# Patient Record
Sex: Female | Born: 1935
Health system: Southern US, Community
[De-identification: ages and names within clinical notes are randomized; demographics above are authoritative.]

## PROBLEM LIST (undated history)

## (undated) DIAGNOSIS — Z950 Presence of cardiac pacemaker: Secondary | ICD-10-CM

## (undated) DIAGNOSIS — I495 Sick sinus syndrome: Secondary | ICD-10-CM

## (undated) DIAGNOSIS — R011 Cardiac murmur, unspecified: Secondary | ICD-10-CM

## (undated) DIAGNOSIS — E039 Hypothyroidism, unspecified: Secondary | ICD-10-CM

## (undated) DIAGNOSIS — I10 Essential (primary) hypertension: Secondary | ICD-10-CM

## (undated) DIAGNOSIS — I499 Cardiac arrhythmia, unspecified: Secondary | ICD-10-CM

## (undated) DIAGNOSIS — M1990 Unspecified osteoarthritis, unspecified site: Secondary | ICD-10-CM

## (undated) HISTORY — PX: INSERT / REPLACE / REMOVE PACEMAKER: SUR710

## (undated) HISTORY — PX: ABDOMINAL HYSTERECTOMY: SHX81

## (undated) HISTORY — DX: Sick sinus syndrome: I49.5

## (undated) HISTORY — PX: OTHER SURGICAL HISTORY: SHX169

## (undated) HISTORY — PX: ROTATOR CUFF REPAIR: SHX139

---

## 1997-12-02 ENCOUNTER — Ambulatory Visit (HOSPITAL_COMMUNITY): Admission: RE | Admit: 1997-12-02 | Discharge: 1997-12-02 | Payer: Self-pay | Admitting: Family Medicine

## 1998-04-28 ENCOUNTER — Ambulatory Visit (HOSPITAL_COMMUNITY): Admission: RE | Admit: 1998-04-28 | Discharge: 1998-04-28 | Payer: Self-pay | Admitting: Gastroenterology

## 2000-12-15 ENCOUNTER — Encounter: Payer: Self-pay | Admitting: Orthopedic Surgery

## 2000-12-22 ENCOUNTER — Observation Stay (HOSPITAL_COMMUNITY): Admission: RE | Admit: 2000-12-22 | Discharge: 2000-12-23 | Payer: Self-pay | Admitting: Orthopedic Surgery

## 2001-06-13 ENCOUNTER — Encounter: Payer: Self-pay | Admitting: Gastroenterology

## 2003-04-21 ENCOUNTER — Encounter: Admission: RE | Admit: 2003-04-21 | Discharge: 2003-05-28 | Payer: Self-pay | Admitting: Family Medicine

## 2003-05-16 ENCOUNTER — Encounter: Admission: RE | Admit: 2003-05-16 | Discharge: 2003-08-14 | Payer: Self-pay | Admitting: Family Medicine

## 2004-02-03 ENCOUNTER — Ambulatory Visit (HOSPITAL_COMMUNITY): Admission: RE | Admit: 2004-02-03 | Discharge: 2004-02-03 | Payer: Self-pay | Admitting: Orthopedic Surgery

## 2004-02-03 ENCOUNTER — Ambulatory Visit (HOSPITAL_BASED_OUTPATIENT_CLINIC_OR_DEPARTMENT_OTHER): Admission: RE | Admit: 2004-02-03 | Discharge: 2004-02-03 | Payer: Self-pay | Admitting: Orthopedic Surgery

## 2006-05-31 ENCOUNTER — Ambulatory Visit: Payer: Self-pay | Admitting: Gastroenterology

## 2006-06-06 ENCOUNTER — Ambulatory Visit: Payer: Self-pay | Admitting: Gastroenterology

## 2006-06-06 ENCOUNTER — Encounter (INDEPENDENT_AMBULATORY_CARE_PROVIDER_SITE_OTHER): Payer: Self-pay | Admitting: Specialist

## 2008-12-23 ENCOUNTER — Telehealth: Payer: Self-pay | Admitting: Gastroenterology

## 2009-09-18 ENCOUNTER — Encounter (INDEPENDENT_AMBULATORY_CARE_PROVIDER_SITE_OTHER): Payer: Self-pay | Admitting: *Deleted

## 2009-09-18 ENCOUNTER — Encounter: Payer: Self-pay | Admitting: Cardiology

## 2009-09-18 LAB — CONVERTED CEMR LAB
ALT: 15 units/L
AST: 15 units/L
Albumin: 4.6 g/dL
Alkaline Phosphatase: 108 units/L
BUN: 15 mg/dL
CO2: 23 meq/L
Calcium: 9.7 mg/dL
Chloride: 23 meq/L
Cholesterol: 168 mg/dL
Creatinine, Ser: 0.94 mg/dL
Glucose, Bld: 101 mg/dL
HCT: 33.8 %
HDL: 62 mg/dL
Hemoglobin: 10.6 g/dL
LDL Cholesterol: 73 mg/dL
MCV: 63.4 fL
Platelets: 482 10*3/uL
Potassium: 4.7 meq/L
Sodium: 140 meq/L
TSH: 1.636 microintl units/mL
Total Protein: 6.8 g/dL
Triglycerides: 163 mg/dL
WBC: 9.9 10*3/uL

## 2010-03-10 ENCOUNTER — Ambulatory Visit (HOSPITAL_COMMUNITY)
Admission: RE | Admit: 2010-03-10 | Discharge: 2010-03-11 | Payer: Self-pay | Source: Home / Self Care | Admitting: Orthopedic Surgery

## 2010-07-20 ENCOUNTER — Encounter (INDEPENDENT_AMBULATORY_CARE_PROVIDER_SITE_OTHER): Payer: Self-pay | Admitting: *Deleted

## 2010-08-26 NOTE — Miscellaneous (Signed)
Summary: cbc,cmp,lipids,tsh  Clinical Lists Changes  Observations: Added new observation of CALCIUM: 9.7 mg/dL (62/95/2841 32:44) Added new observation of ALBUMIN: 4.6 g/dL (07/27/7251 66:44) Added new observation of PROTEIN, TOT: 6.8 g/dL (03/47/4259 56:38) Added new observation of SGPT (ALT): 15 units/L (09/18/2009 13:45) Added new observation of SGOT (AST): 15 units/L (09/18/2009 13:45) Added new observation of ALK PHOS: 108 units/L (09/18/2009 13:45) Added new observation of BILI DIRECT: total bili   0.6 mg/dL (75/64/3329 51:88) Added new observation of CREATININE: 0.94 mg/dL (41/66/0630 16:01) Added new observation of BUN: 15 mg/dL (09/32/3557 32:20) Added new observation of BG RANDOM: 101 mg/dL (25/42/7062 37:62) Added new observation of CO2 PLSM/SER: 23 meq/L (09/18/2009 13:45) Added new observation of CL SERUM: 23 meq/L (09/18/2009 13:45) Added new observation of K SERUM: 4.7 meq/L (09/18/2009 13:45) Added new observation of NA: 140 meq/L (09/18/2009 13:45) Added new observation of LDL: 73 mg/dL (83/15/1761 60:73) Added new observation of HDL: 62 mg/dL (71/12/2692 85:46) Added new observation of TRIGLYC TOT: 163 mg/dL (27/09/5007 38:18) Added new observation of CHOLESTEROL: 168 mg/dL (29/93/7169 67:89) Added new observation of PLATELETK/UL: 482 K/uL (09/18/2009 13:45) Added new observation of MCV: 63.4 fL (09/18/2009 13:45) Added new observation of HCT: 33.8 % (09/18/2009 13:45) Added new observation of HGB: 10.6 g/dL (38/04/1750 02:58) Added new observation of WBC COUNT: 9.9 10*3/microliter (09/18/2009 13:45) Added new observation of TSH: 1.636 microintl units/mL (09/18/2009 13:45)

## 2010-08-26 NOTE — Cardiovascular Report (Signed)
Summary: Cardiac Cath Other  Cardiac Cath Other   Imported By: Faythe Ghee 09/18/2009 10:34:37  _____________________________________________________________________  External Attachment:    Type:   Image     Comment:   External Document

## 2010-08-26 NOTE — Letter (Signed)
Summary: New Patient letter  [facility]  [address] [place], [place] [address]   Phone: [phone]  Fax: [phone]       07/20/2010 MRN: [id number]  Katelyn Morris [address] [place], [place]  [address]  Dear Katelyn Morris,  Welcome to the Gastroenterology Division at [facility].    You are scheduled to see Dr.  Arlyce Dice on 08-30-10 at 10:45a.m. on the 3rd floor at [facility], [address]  We ask that you try to arrive at our office 15 minutes prior to your appointment time to allow for check-in.  We would like you to complete the enclosed self-administered evaluation form prior to your visit and bring it with you on the day of your appointment.  We will review it with you.  Also, please bring a complete list of all your medications or, if you prefer, bring the medication bottles and we will list them.  Please bring your insurance card so that we may make a copy of it.  If your insurance requires a referral to see a specialist, please bring your referral form from your primary care physician.  Co-payments are due at the time of your visit and may be paid by cash, check or credit card.     Your office visit will consist of a consult with your physician (includes a physical exam), any laboratory testing he/she may order, scheduling of any necessary diagnostic testing (e.g. x-ray, ultrasound, CT-scan), and scheduling of a procedure (e.g. Endoscopy, Colonoscopy) if required.  Please allow enough time on your schedule to allow for any/all of these possibilities.    If you cannot keep your appointment, please call [phone] to cancel or reschedule prior to your appointment date.  This allows [place] the opportunity to schedule an appointment for another patient in need of care.  If you do not cancel or reschedule by 5 p.m. the business day prior to your appointment date, you will be charged a $50.00 late cancellation/no-show fee.    Thank you for choosing [facility] for your medical needs.  We appreciate the opportunity to care for you.  Please visit [place] at our website  to learn more about our practice.                     Sincerely,                                                             The Gastroenterology Division

## 2010-08-26 NOTE — Letter (Signed)
Summary: [facility] DISCHARGE   Imported By: Faythe Ghee 09/18/2009 10:34:07  _____________________________________________________________________  External Attachment:    Type:   Image     Comment:   External Document

## 2010-08-30 ENCOUNTER — Ambulatory Visit (INDEPENDENT_AMBULATORY_CARE_PROVIDER_SITE_OTHER): Payer: MEDICARE | Admitting: Gastroenterology

## 2010-08-30 ENCOUNTER — Encounter: Payer: Self-pay | Admitting: Gastroenterology

## 2010-08-30 DIAGNOSIS — Z860100 Personal history of colon polyps, unspecified: Secondary | ICD-10-CM

## 2010-08-30 DIAGNOSIS — Z800 Family history of malignant neoplasm of digestive organs: Secondary | ICD-10-CM

## 2010-08-30 DIAGNOSIS — R141 Gas pain: Secondary | ICD-10-CM

## 2010-08-30 DIAGNOSIS — K5730 Diverticulosis of large intestine without perforation or abscess without bleeding: Secondary | ICD-10-CM | POA: Insufficient documentation

## 2010-08-30 DIAGNOSIS — R142 Eructation: Secondary | ICD-10-CM

## 2010-08-30 DIAGNOSIS — E039 Hypothyroidism, unspecified: Secondary | ICD-10-CM | POA: Insufficient documentation

## 2010-08-30 DIAGNOSIS — E079 Disorder of thyroid, unspecified: Secondary | ICD-10-CM | POA: Insufficient documentation

## 2010-08-30 DIAGNOSIS — R143 Flatulence: Secondary | ICD-10-CM | POA: Insufficient documentation

## 2010-08-30 DIAGNOSIS — I10 Essential (primary) hypertension: Secondary | ICD-10-CM | POA: Insufficient documentation

## 2010-08-31 ENCOUNTER — Other Ambulatory Visit: Payer: Self-pay | Admitting: Gastroenterology

## 2010-08-31 ENCOUNTER — Other Ambulatory Visit (AMBULATORY_SURGERY_CENTER): Payer: MEDICARE | Admitting: Gastroenterology

## 2010-08-31 DIAGNOSIS — Z800 Family history of malignant neoplasm of digestive organs: Secondary | ICD-10-CM

## 2010-08-31 DIAGNOSIS — K5730 Diverticulosis of large intestine without perforation or abscess without bleeding: Secondary | ICD-10-CM

## 2010-08-31 DIAGNOSIS — Z86010 Personal history of colonic polyps: Secondary | ICD-10-CM

## 2010-08-31 DIAGNOSIS — Z1211 Encounter for screening for malignant neoplasm of colon: Secondary | ICD-10-CM

## 2010-08-31 DIAGNOSIS — D126 Benign neoplasm of colon, unspecified: Secondary | ICD-10-CM

## 2010-09-07 ENCOUNTER — Encounter: Payer: Self-pay | Admitting: Gastroenterology

## 2010-09-09 NOTE — Assessment & Plan Note (Signed)
Summary: consult colon    History of Present Illness Visit Type: new patient  Primary GI MD: Melvia Heaps MD [facility] Primary Provider: C. Duane Lope, MD [facility] Physician) Requesting Provider: na Chief Complaint: Consult colon. Pt c/o bloating  History of Present Illness:   [name] is a pleasant 77 year old white female with a history of colon polyps here for to set up a colonoscopy.  Family history is pertinent for 2 sisters with colon cancer.  In 1999 24 polyps were removed.  Subsequent colonoscopies in 2002 and in 2007 small polyps were removed.  She has no GI complaints including change of bowel habits, abdominal pain, melena or hematochezia.   GI Review of Systems    Reports bloating.      Denies abdominal pain, acid reflux, belching, chest pain, dysphagia with liquids, dysphagia with solids, heartburn, loss of appetite, nausea, vomiting, vomiting blood, weight loss, and  weight gain.        Denies anal fissure, black tarry stools, change in bowel habit, constipation, diarrhea, diverticulosis, fecal incontinence, heme positive stool, hemorrhoids, irritable bowel syndrome, jaundice, light color stool, liver problems, rectal bleeding, and  rectal pain.    Current Medications (verified): 1)  Levoxyl 50 Mcg Tabs (Levothyroxine Sodium) .... One Tablet By Mouth Once Daily 2)  Premarin 0.625 Mg Tabs (Estrogens Conjugated) .... 1/2 Tablet By Mouth At Bedtime As Needed 3)  Hyzaar 50-12.5 Mg Tabs (Losartan Potassium-Hctz) .... One Tablet By Mouth Once Daily 4)  Aspirin 81 Mg  Tabs (Aspirin) .... One Tablet By Mouth Once Daily  Allergies (verified): 1)  ! 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  Alcohol Use - no Daily Caffeine Use: one daily  Illicit Drug Use - no Smoking Status:  never Drug Use:  no  Review of Systems       The patient complains of hearing problems.  The patient denies allergy/sinus, anemia, anxiety-new, arthritis/joint pain, back pain, blood in urine, breast changes/lumps, change in vision, confusion, cough, coughing up blood, depression-new, fainting, fatigue, fever, headaches-new, heart murmur, heart rhythm changes, itching, menstrual pain, muscle pains/cramps, night sweats, nosebleeds, pregnancy symptoms, shortness of breath, skin rash, sleeping problems, sore throat, swelling of feet/legs, swollen lymph glands, thirst - excessive , urination - excessive , urination changes/pain, urine leakage, vision changes, and voice change.         All other systems were reviewed and were negative   Vital Signs:  Patient profile:   77 year old female Height:      63 inches Weight:      135 pounds BMI:     24.00 BSA:     1.64 Pulse rate:   64 / minute Pulse rhythm:   regular BP sitting:   132 / 80  (left arm) Cuff size:   regular  Vitals Entered By: Ok Anis CMA (August 30, 2010 10:41 AM)  Physical Exam  Additional Exam:  On physical exam she is a well-developed well-nourished female  skin: anicteric HEENT: normocephalic; PEERLA; no nasal or pharyngeal abnormalities neck: supple  nodes: no cervical lymphadenopathy chest: clear to ausculatation and percussion heart: no murmurs, gallops, or rubs abd: soft, nontender; BS normoactive; no abdominal masses, tenderness, organomegaly rectal: deferred ext: no cynanosis, clubbing, edema skeletal: no deformities neuro: oriented x 3; no focal  abnormalities    Impression & Recommendations:  Problem # 1:  ADENOCARCINOMA, COLON, FAMILY HX (ICD-V16.0)  Plan followup colonoscopy.  The patient looks far younger than her biologic age and her functional status is 100%  Orders: Colonoscopy (Colon)  Problem # 2:  TUBULOVILLOUS ADENOMA, COLON, HX OF (ICD-V12.72) Planned per assessment #1  Patient Instructions: 1)  Copy sent to : C. Duane Lope, MD [facility] Physician) 2)  Your Colonoscopy is scheduled on 08/31/2010 at 10am 3)  You will pick up your MoviPrep from your pharmacy today 4)  Colonoscopy and Flexible Sigmoidoscopy brochure given.  5)  Conscious Sedation brochure given.  6)  The medication list was reviewed and reconciled.  All changed / newly prescribed medications were explained.  A complete medication list was provided to the patient / caregiver. Prescriptions: MOVIPREP 100 GM  SOLR (PEG-KCL-NACL-NASULF-NA ASC-C) As per prep instructions.  #1 x 0   Entered by:   Merri Ray CMA (AAMA)   Authorized by:   Louis Meckel MD   Signed by:   Merri Ray CMA (AAMA) on 08/30/2010   Method used:   Electronically to        [facility] [phone]* (retail)       [address] [place], [place]  [address]       Ph: [phone] or [phone]       Fax: [phone]   RxID:   [id number]

## 2010-09-09 NOTE — Letter (Signed)
Summary: [facility] Instructions  [facility]  [address] [place], [place] [address]   Phone: [phone]  Fax: [phone]       Katelyn Morris    24-Nov-1922    MRN: [id number]        Procedure Day /Date:TUESDAY 08/31/2010     Arrival Time:9AM     Procedure Time:10AM     Location of Procedure:                    X   [facility] (4th Floor) _ _  [facility] ( Outpatient Registration) _ _  [facility] ( Short Stay on [facility])                       PREPARATION FOR COLONOSCOPY WITH MOVIPREP   Starting 5 days prior to your procedureTODAY do not eat nuts, seeds, popcorn, corn, beans, peas,  salads, or any raw vegetables.  Do not take any fiber supplements (e.g. Metamucil, Citrucel, and Benefiber).  THE DAY BEFORE YOUR PROCEDURE         DATE: 08/30/2010  DAY: MONDAY  1.  Drink clear liquids the entire day-NO SOLID FOOD  2.  Do not drink anything colored red or purple.  Avoid juices with pulp.  No orange juice.  3.  Drink at least 64 oz. (8 glasses) of fluid/clear liquids during the day to prevent dehydration and help the prep work efficiently.  CLEAR LIQUIDS INCLUDE: Water Jello Ice Popsicles Tea (sugar ok, no milk/cream) Powdered fruit flavored drinks Coffee (sugar ok, no milk/cream) Gatorade Juice: apple, white grape, white cranberry  Lemonade Clear bullion, consomm, broth Carbonated beverages (any kind) Strained chicken noodle soup Hard Candy                             4.  In the morning, mix first dose of MoviPrep solution:    Empty 1 Pouch A and 1 Pouch B into the disposable container    Add lukewarm drinking water to the top line of the container. Mix to dissolve    Refrigerate (mixed solution should be used within 24 hrs)  5.  Begin drinking the prep at 5:00 p.m. The MoviPrep container is divided by 4 marks.   Every 15 minutes drink the solution down to the next mark (approximately 8 oz) until the full liter is  complete.   6.  Follow completed prep with 16 oz of clear liquid of your choice (Nothing red or purple).  Continue to drink clear liquids until bedtime.  7.  Before going to bed, mix second dose of MoviPrep solution:    Empty 1 Pouch A and 1 Pouch B into the disposable container    Add lukewarm drinking water to the top line of the container. Mix to dissolve    Refrigerate  THE DAY OF YOUR PROCEDURE      DATE: 08/31/2010 DAY: TUESDAY  Beginning at 5a.m. (5 hours before procedure):         1. Every 15 minutes, drink the solution down to the next mark (approx 8 oz) until the full liter is complete.  2. Follow completed prep with 16 oz. of clear liquid of your choice.    3. You may drink clear liquids until 8AM  (2 HOURS BEFORE PROCEDURE).   MEDICATION INSTRUCTIONS  Unless otherwise instructed, you should take regular prescription medications with a  small sip of water   as early as possible the morning of your procedure.        OTHER INSTRUCTIONS  You will need a responsible adult at least 75 years of age to accompany you and drive you home.   This person must remain in the waiting room during your procedure.  Wear loose fitting clothing that is easily removed.  Leave jewelry and other valuables at home.  However, you may wish to bring a book to read or  an iPod/MP3 player to listen to music as you wait for your procedure to start.  Remove all body piercing jewelry and leave at home.  Total time from sign-in until discharge is approximately 2-3 hours.  You should go home directly after your procedure and rest.  You can resume normal activities the  day after your procedure.  The day of your procedure you should not:   Drive   Make legal decisions   Operate machinery   Drink alcohol   Return to work  You will receive specific instructions about eating, activities and medications before you leave.    The above instructions have been reviewed and explained to  me by   _______________________    I fully understand and can verbalize these instructions _____________________________ Date _________

## 2010-09-15 NOTE — Letter (Signed)
Summary: Patient Notice- Polyp Results  [facility]  [address] [place], [place] [address]   Phone: [phone]  Fax: [phone]        September 07, 2010 MRN: [id number]    [facility] [address] [place], [place]  [address]    Dear Ms. Ruotolo,  I am pleased to inform you that the colon polyp(s) removed during your recent colonoscopy was (were) found to be benign (no cancer detected) upon pathologic examination.  I recommend you have a repeat colonoscopy examination in _ years to look for recurrent polyps, as having colon polyps increases your risk for having recurrent polyps or even colon cancer in the future.  Should you develop new or worsening symptoms of abdominal pain, bowel habit changes or bleeding from the rectum or bowels, please schedule an evaluation with either your primary care physician or with me.  Additional information/recommendations:  __x No further action with gastroenterology is needed at this time. Please      follow-up with your primary care physician for your other healthcare      needs.  __ Please call [phone] to schedule a return visit to review your      situation.  __ Please keep your follow-up visit as already scheduled.  __ Continue treatment plan as outlined the day of your exam.  Please call us if you are having persistent problems or have questions about your condition that have not been fully answered at this time.  Sincerely,  Louis Meckel MD  This letter has been electronically signed by your physician.  Appended Document: Patient Notice- Polyp Results Letter mailed

## 2010-09-15 NOTE — Procedures (Signed)
Summary: Colonscopy   Colonoscopy  Procedure date:  08/31/2010  Findings:      Location:  [facility].   COLONOSCOPY PROCEDURE REPORT  PATIENT:  Katelyn Morris, Katelyn Morris  MR#:  [id number] BIRTHDATE:   [birth year], 87 yrs. old   GENDER:   female  ENDOSCOPIST:   Barbette Hair. Arlyce Dice, MD Referred by: Duane Lope, M.D.  PROCEDURE DATE:  08/31/2010 PROCEDURE:  Colonoscopy with snare polypectomy ASA CLASS:   Class II INDICATIONS: 1) screening  2) history of pre-cancerous (adenomatous) colon polyps  3) family history of colon cancer 2 sisters  MEDICATIONS:    Fentanyl 50 mcg IV, Versed 5 mg IV  DESCRIPTION OF PROCEDURE:   After the risks benefits and alternatives of the procedure were thoroughly explained, informed consent was obtained.  Digital rectal exam was performed and revealed no abnormalities.   The LB 180AL [id number] endoscope was introduced through the anus and advanced to the cecum, which was identified by the ileocecal valve, without limitations.  The quality of the prep was good, using MoviPrep.  The instrument was then slowly withdrawn as the colon was fully examined. <<PROCEDUREIMAGES>>            <<OLD IMAGES>>  FINDINGS:  A sessile polyp was found in the ascending colon. It was 3 mm in size. Polyp was snared without cautery. Retrieval was successful (see image5). snare polyp  A sessile polyp was found in the sigmoid colon. It was 13 mm in size. It was found 15 cm from the point of entry. Polyps were snared, then cauterized with monopolar cautery. Retrieval was successful (see image11). snare polyp  Severe diverticulosis was found in the sigmoid colon (see image8).  This was otherwise a normal examination of the colon (see image3, image4, image6, and image14).   Retroflexed views in the rectum revealed no abnormalities.    The time to cecum =  5.25  minutes. The scope was then withdrawn (time =  11.75  min) from the patient and the procedure completed.  COMPLICATIONS:    None  ENDOSCOPIC IMPRESSION:  1) 3 mm sessile polyp in the ascending colon  2) 13 mm sessile polyp in the sigmoid colon  3) Severe diverticulosis in the sigmoid colon  4) Otherwise normal examination RECOMMENDATIONS:  1) Return to the care of your primary provider. GI follow up as needed (advanced age)    REPEAT EXAM:   No   _______________________________ Barbette Hair. Arlyce Dice, MD    CC:

## 2010-10-08 LAB — CBC
HCT: 39.7 % (ref 36.0–46.0)
Hemoglobin: 13.7 g/dL (ref 12.0–15.0)
MCH: 31 pg (ref 26.0–34.0)
MCHC: 34.6 g/dL (ref 30.0–36.0)
MCV: 89.6 fL (ref 78.0–100.0)
Platelets: 201 10*3/uL (ref 150–400)
RBC: 4.42 MIL/uL (ref 3.87–5.11)
RDW: 13.2 % (ref 11.5–15.5)
WBC: 3.9 10*3/uL — ABNORMAL LOW (ref 4.0–10.5)

## 2010-10-08 LAB — COMPREHENSIVE METABOLIC PANEL
Alkaline Phosphatase: 49 U/L (ref 39–117)
BUN: 14 mg/dL (ref 6–23)
CO2: 29 mEq/L (ref 19–32)
GFR calc non Af Amer: >60 mL/min (ref >60)
Glucose, Bld: 95 mg/dL (ref 70–99)
Potassium: 4.3 mEq/L (ref 3.5–5.1)
Total Protein: 6.6 g/dL (ref 6.0–8.3)

## 2010-10-08 LAB — DIFFERENTIAL
Basophils Absolute: 0 10*3/uL (ref 0.0–0.1)
Basophils Relative: 1 % (ref 0–1)
Monocytes Relative: 10 % (ref 3–12)
Neutro Abs: 1.8 10*3/uL (ref 1.7–7.7)
Neutrophils Relative %: 45 % (ref 43–77)

## 2010-10-08 LAB — URINALYSIS, ROUTINE W REFLEX MICROSCOPIC
Bilirubin Urine: NEGATIVE
Hgb urine dipstick: NEGATIVE
Ketones, ur: NEGATIVE mg/dL
Nitrite: NEGATIVE
Protein, ur: NEGATIVE mg/dL
Specific Gravity, Urine: 1.014 (ref 1.005–1.030)
Urobilinogen, UA: 0.2 mg/dL (ref 0.0–1.0)

## 2010-10-08 LAB — PROTIME-INR
INR: 1.02 (ref 0.00–1.49)
Prothrombin Time: 13.6 seconds (ref 11.6–15.2)

## 2010-10-08 LAB — APTT: aPTT: 28 seconds (ref 24–37)

## 2010-12-10 NOTE — Op Note (Signed)
[facility]  Patient:    Katelyn Morris, Katelyn Morris                    MRN: [id number] Proc. Date: 12/22/00 Adm. Date:  [id number] Attending:  Skip Mayer                           Operative Report  PREOPERATIVE DIAGNOSES: 1. Severe tear of the rotator cuff tendon on the left. 2. Severe impingement syndrome of the left shoulder.  POSTOPERATIVE DIAGNOSES: 1. Severe tear of the rotator cuff tendon on the left. 2. Severe impingement syndrome of the left shoulder.  OPERATION: 1. Partial acromionectomy and acromioplasty on left. 2. Repair of a severe irregular tear of the rotator cuff utilizer a Restore    graft from the [employer].  SURGEON:  Georges Lynch. Gioffre, M.D.  ASSISTANTJill Side Mahar, P.A.-C.  DESCRIPTION OF PROCEDURE:  Under general anesthesia, a routine orthopedic prepping and draping of the right shoulder was carried out.  The patient underwent 1 gram of Ancef.  At this time, an incision was made over the anterior aspect of the right shoulder, bleeders identified and cauterized.  I then went down and inserted the self-retaining retractors.  I then went on and dissected the deltoid tendon from the acromion in the usual fashion.  I split the muscle proximally.  I then went down and noted a large amount of fluid that was present in the bursa and just up under the bursa which was indicative of a tear in the rotator cuff.  I first excised the subdeltoid bursa.  She had severe ______  impingement of her acromion into the tendon, and that is literally what caused the tendon to erode away.  I protected the remaining part of the tendon with a Bennett retractor and then did a partial acromionectomy with the oscillating saw and then I utilized the bur to even out the area.  I thoroughly irrigated out the area, and I identified the central portion of the rotator cuff that actually was irregularly torn, and I then reapproximated it to the  remaining tendon which is actually about a third of its normal thickness by utilizing a Restore graft from the [employer].  We sutured this down in place with #1 Ethibond suture.  I thoroughly irrigated out the shoulder.  The remaining part of the tendon and muscle was closed in the usual fashion.  The skin was closed in the usual fashion, and sterile dressings were applied.  The patient was placed in a shoulder immobilizer. DD:  12/22/00 TD:  12/22/00 Job: [id number]

## 2010-12-10 NOTE — Assessment & Plan Note (Signed)
[facility]                           GASTROENTEROLOGY OFFICE NOTE   NAME:HOLTAnaysha, Andre                    MRN:          [id number]  DATE:05/31/2006                            DOB:          Dec 14, 1922    REASON FOR CONSULTATION:  Colonoscopy.   REASON:  Ms. Dorough is an 77 year old white female referred through the  courtesy of Dr. Tenny Craw for evaluation.  She has a history of colon polyps.  Family history is notable for sister who developed colon cancer in the 3s.  Ms. Vandekamp has no GI complaints including change of bowel habits, abdominal  pain, rectal bleeding or melena.   PAST MEDICAL HISTORY:  Is pertinent for hypertension and thyroid disease.   FAMILY HISTORY:  Is also pertinent for a sister with breast cancer.   MEDICATIONS:  Hyzaar, baby aspirin, estrogen and Levoxyl.   She is allergic to SULFA.   She neither smokes or drinks.  She is widowed and retired.   REVIEW OF SYSTEMS:  Was reviewed and was negative.   PHYSICAL EXAMINATION:  Pulse 60.  Blood pressure 1122/66.  Weight 133.  HEENT: EOMI. PERRLA. Sclerae are anicteric.  Conjunctivae are pink.  NECK:  Supple without thyromegaly, adenopathy or carotid bruits.  CHEST:  Clear to auscultation and percussion without adventitious sounds.  CARDIAC:  Regular rhythm; normal S1 S2.  There are no murmurs, gallops or  rubs.  ABDOMEN:  Bowel sounds are normoactive.  Abdomen is soft, non-tender and non-  distended.  There are no abdominal masses, tenderness, splenic enlargement  or hepatomegaly.  EXTREMITIES:  Full range of motion.  No cyanosis, clubbing or edema.  RECTAL:  Deferred.   IMPRESSION:  Personal history of colon polyps and family history of  colorectal cancer.   RECOMMENDATION:  Colonoscopy.     Barbette Hair. Arlyce Dice, MD,FACG  Electronically Signed    RDK/MedQ  DD: 05/31/2006  DT: 05/31/2006  Job #: [id number]   cc:   C. Duane Lope, M.D.

## 2010-12-10 NOTE — Op Note (Signed)
NAME:  Katelyn Morris, Katelyn Morris                       ACCOUNT NO.:  [id number]   MEDICAL RECORD NO.:  [id number]                   PATIENT TYPE:  AMB   LOCATION:  [facility]                                 FACILITY:  [facility]   PHYSICIAN:  Georges Lynch. Gioffre, M.D.             DATE OF BIRTH:  1923-07-15   DATE OF PROCEDURE:  DATE OF DISCHARGE:                                 OPERATIVE REPORT   PREOPERATIVE DIAGNOSES:  Large ganglion cyst over the ulnar, volar aspect of  the left wrist.   POSTOPERATIVE DIAGNOSES:  Large ganglion cyst over the ulnar, volar aspect  of the left wrist.   OPERATION:  Ganglionectomy left wrist.   DESCRIPTION OF PROCEDURE:  Under IV regional anesthesia, routine orthopedic  prep and draping of the left upper extremity was carried out. An incision  was made over the volar ulnar aspect of the left wrist, bleeders were  cauterized with the bipolar.  I identified the sensory nerve, retracted it  gently and went down and did a ganglionectomy. I then cauterized the deep  venous structures with a bipolar, irrigated the area and closed the wound  with 3-0 nylon suture and a sterile Neosporin dressing was applied.                                               Ronald A. Darrelyn Hillock, M.D.    RAG/MEDQ  D:  02/03/2004  T:  02/03/2004  Job:  [id number]

## 2012-02-03 ENCOUNTER — Other Ambulatory Visit: Payer: Self-pay | Admitting: Cardiovascular Disease

## 2012-02-06 ENCOUNTER — Encounter (HOSPITAL_COMMUNITY): Payer: Self-pay | Admitting: Respiratory Therapy

## 2012-02-14 ENCOUNTER — Ambulatory Visit
Admission: RE | Admit: 2012-02-14 | Discharge: 2012-02-14 | Disposition: A | Discharge status: Home / Self Care | Payer: Medicare Other | Source: Ambulatory Visit | Attending: Cardiovascular Disease | Admitting: Cardiovascular Disease

## 2012-02-14 ENCOUNTER — Other Ambulatory Visit: Payer: Self-pay | Admitting: Cardiovascular Disease

## 2012-02-14 DIAGNOSIS — Z01811 Encounter for preprocedural respiratory examination: Secondary | ICD-10-CM

## 2012-02-19 MED ORDER — CEFAZOLIN SODIUM-DEXTROSE 2-3 GM-% IV SOLR
2.0000 g | INTRAVENOUS | Status: DC
  Filled 2012-02-19: qty 50

## 2012-02-19 MED ORDER — SODIUM CHLORIDE 0.9 % IR SOLN
80.0000 mg | Status: DC
  Filled 2012-02-19: qty 2

## 2012-02-20 ENCOUNTER — Ambulatory Visit (HOSPITAL_COMMUNITY)
Admission: RE | Admit: 2012-02-20 | Discharge: 2012-02-21 | Disposition: A | Discharge status: Home / Self Care | Payer: Medicare Other | Source: Ambulatory Visit | Attending: Cardiovascular Disease | Admitting: Cardiovascular Disease

## 2012-02-20 ENCOUNTER — Encounter (HOSPITAL_COMMUNITY)
Admission: RE | Disposition: A | Discharge status: Home / Self Care | Payer: Self-pay | Source: Ambulatory Visit | Attending: Cardiovascular Disease

## 2012-02-20 ENCOUNTER — Encounter (HOSPITAL_COMMUNITY): Payer: Self-pay | Admitting: General Practice

## 2012-02-20 DIAGNOSIS — Z950 Presence of cardiac pacemaker: Secondary | ICD-10-CM

## 2012-02-20 DIAGNOSIS — I441 Atrioventricular block, second degree: Secondary | ICD-10-CM | POA: Insufficient documentation

## 2012-02-20 DIAGNOSIS — R55 Syncope and collapse: Secondary | ICD-10-CM

## 2012-02-20 DIAGNOSIS — R001 Bradycardia, unspecified: Secondary | ICD-10-CM | POA: Diagnosis present

## 2012-02-20 DIAGNOSIS — I10 Essential (primary) hypertension: Secondary | ICD-10-CM | POA: Diagnosis present

## 2012-02-20 DIAGNOSIS — I495 Sick sinus syndrome: Secondary | ICD-10-CM | POA: Insufficient documentation

## 2012-02-20 HISTORY — DX: Presence of cardiac pacemaker: Z95.0

## 2012-02-20 HISTORY — DX: Hypothyroidism, unspecified: E03.9

## 2012-02-20 HISTORY — DX: Cardiac murmur, unspecified: R01.1

## 2012-02-20 HISTORY — PX: PERMANENT PACEMAKER INSERTION: SHX5480

## 2012-02-20 HISTORY — DX: Essential (primary) hypertension: I10

## 2012-02-20 HISTORY — DX: Unspecified osteoarthritis, unspecified site: M19.90

## 2012-02-20 HISTORY — DX: Cardiac arrhythmia, unspecified: I49.9

## 2012-02-20 LAB — SURGICAL PCR SCREEN: Staphylococcus aureus: NEGATIVE

## 2012-02-20 SURGERY — PERMANENT PACEMAKER INSERTION
Anesthesia: LOCAL

## 2012-02-20 MED ORDER — ASPIRIN 81 MG PO TABS: 81.0000 mg | ORAL_TABLET | Freq: Every day | ORAL | Status: DC

## 2012-02-20 MED ORDER — HYDROCHLOROTHIAZIDE 12.5 MG PO CAPS
12.5000 mg | ORAL_CAPSULE | Freq: Every day | ORAL | Status: DC
  Administered 2012-02-21: 12.5 mg via ORAL
  Filled 2012-02-20: qty 1

## 2012-02-20 MED ORDER — SODIUM CHLORIDE 0.45 % IV SOLN
INTRAVENOUS | Status: DC
  Administered 2012-02-20: 1000 mL via INTRAVENOUS

## 2012-02-20 MED ORDER — MIDAZOLAM HCL 2 MG/2ML IJ SOLN
INTRAMUSCULAR | Status: AC
  Filled 2012-02-20: qty 2

## 2012-02-20 MED ORDER — LOSARTAN POTASSIUM-HCTZ 50-12.5 MG PO TABS: 1.0000 | ORAL_TABLET | Freq: Every day | ORAL | Status: DC

## 2012-02-20 MED ORDER — ASPIRIN EC 81 MG PO TBEC
81.0000 mg | DELAYED_RELEASE_TABLET | Freq: Every day | ORAL | Status: DC
  Administered 2012-02-21: 81 mg via ORAL
  Filled 2012-02-20: qty 1

## 2012-02-20 MED ORDER — SODIUM CHLORIDE 0.9 % IV SOLN: 250.0000 mL | INTRAVENOUS | Status: DC | PRN

## 2012-02-20 MED ORDER — SODIUM CHLORIDE 0.9 % IJ SOLN: 3.0000 mL | INTRAMUSCULAR | Status: DC | PRN

## 2012-02-20 MED ORDER — LIDOCAINE HCL (PF) 1 % IJ SOLN
INTRAMUSCULAR | Status: AC
  Filled 2012-02-20: qty 60

## 2012-02-20 MED ORDER — HYDROCODONE-ACETAMINOPHEN 5-325 MG PO TABS: 1.0000 | ORAL_TABLET | ORAL | Status: DC | PRN

## 2012-02-20 MED ORDER — MUPIROCIN 2 % EX OINT
TOPICAL_OINTMENT | CUTANEOUS | Status: AC
  Administered 2012-02-20: 08:00:00 via NASAL
  Filled 2012-02-20: qty 22

## 2012-02-20 MED ORDER — CHLORHEXIDINE GLUCONATE 4 % EX LIQD: 60.0000 mL | Freq: Once | CUTANEOUS | Status: DC

## 2012-02-20 MED ORDER — SODIUM CHLORIDE 0.9 % IV SOLN
INTRAVENOUS | Status: AC
  Administered 2012-02-20: 11:00:00 via INTRAVENOUS

## 2012-02-20 MED ORDER — ONDANSETRON HCL 4 MG/2ML IJ SOLN: 4.0000 mg | Freq: Four times a day (QID) | INTRAMUSCULAR | Status: DC | PRN

## 2012-02-20 MED ORDER — HEPARIN (PORCINE) IN NACL 2-0.9 UNIT/ML-% IJ SOLN
INTRAMUSCULAR | Status: AC
  Filled 2012-02-20: qty 1000

## 2012-02-20 MED ORDER — LOSARTAN POTASSIUM 50 MG PO TABS
50.0000 mg | ORAL_TABLET | Freq: Every day | ORAL | Status: DC
  Administered 2012-02-21: 50 mg via ORAL
  Filled 2012-02-20: qty 1

## 2012-02-20 MED ORDER — FENTANYL CITRATE 0.05 MG/ML IJ SOLN
INTRAMUSCULAR | Status: AC
  Filled 2012-02-20: qty 2

## 2012-02-20 MED ORDER — ESTROPIPATE 0.75 MG PO TABS
0.7500 mg | ORAL_TABLET | Freq: Every day | ORAL | Status: DC
  Administered 2012-02-21: 0.75 mg via ORAL
  Filled 2012-02-20: qty 1

## 2012-02-20 MED ORDER — ALPRAZOLAM 0.25 MG PO TABS
0.2500 mg | ORAL_TABLET | Freq: Three times a day (TID) | ORAL | Status: DC | PRN
  Administered 2012-02-20: 22:00:00 0.25 mg via ORAL
  Filled 2012-02-20: qty 1

## 2012-02-20 MED ORDER — SODIUM CHLORIDE 0.9 % IJ SOLN
3.0000 mL | Freq: Two times a day (BID) | INTRAMUSCULAR | Status: DC
  Administered 2012-02-20: 20:00:00 3 mL via INTRAVENOUS

## 2012-02-20 MED ORDER — CEFAZOLIN SODIUM 1-5 GM-% IV SOLN
1.0000 g | Freq: Four times a day (QID) | INTRAVENOUS | Status: AC
  Administered 2012-02-20 – 2012-02-21 (×3): 1 g via INTRAVENOUS
  Filled 2012-02-20 (×3): qty 50

## 2012-02-20 MED ORDER — ACETAMINOPHEN 325 MG PO TABS
325.0000 mg | ORAL_TABLET | ORAL | Status: DC | PRN
  Administered 2012-02-20: 650 mg via ORAL
  Filled 2012-02-20: qty 2

## 2012-02-20 MED ORDER — LEVOTHYROXINE SODIUM 50 MCG PO TABS
50.0000 ug | ORAL_TABLET | Freq: Every day | ORAL | Status: DC
  Administered 2012-02-21: 08:00:00 50 ug via ORAL
  Filled 2012-02-20 (×2): qty 1

## 2012-02-20 NOTE — H&P (Signed)
  Date of Initial H&P: 02/11/12  History reviewed, patient examined, no change in status, stable for surgery. This procedure has been fully reviewed with the patient and written informed consent has been obtained. Thurmon Fair, MD, [facility] [phone] office [phone] pager 02/20/2012 8:56 AM

## 2012-02-20 NOTE — CV Procedure (Signed)
[facility] Female, 78 y.o., 1923/06/23  MRN: [id number]  CSN: [id number]  Admit Dt: 02/20/12   Procedure report  Procedure performed:  1. Implantation of new dual chamber permanent pacemaker 2. Fluoroscopy 3. Light sedation  Reason for procedure: Symptomatic bradycardia due to: Sinus node dysfunction Second degree atrioventricular block Mobitz type I  Chronotropic incompetence  Procedure performed by: Thurmon Fair, MD  Complications: None  Estimated blood loss: <10 mL  Medications administered during procedure: Ancef 2g intravenously Lidocaine 1% 30 mL locally,  Fentanyl 75 mcg intravenously Versed 3 mg intravenously  Device details:  Generator Medtronic Revo model RVDR 01 serial number [id number] [id number] H Right atrial lead 5086 MRI-45 cm serial number [id number] Right ventricular lead 5086 MRI-52 cm serial number [id number]  Procedure details:  After the risks and benefits of the procedure were discussed the patient provided informed consent and was brought to the cardiac cath lab in the fasting state. The patient was prepped and draped in usual sterile fashion. Local anesthesia with 1% lidocaine was administered to to the left infraclavicular area. A 5-6 cm horizontal incision was made parallel with and 2-3 cm caudal to the left clavicle. Using electrocautery and blunt dissection a prepectoral pocket was created down to the level of the pectoralis major muscle fascia. The pocket was carefully inspected for hemostasis. An antibiotic-soaked sponge was placed in the pocket.  Under fluoroscopic guidance and using the modified Seldinger technique 2 separate venipunctures were performed to access the left subclavian vein. no difficulty was encountered accessing the vein.  Two J-tip guidewires were subsequently exchanged for two 8 French safe sheaths.  Under fluoroscopic guidance the ventricular lead was advanced to level of the mid to apical right ventricular septum and  thet active-fixation helix was deployed. Prominent current of injury was seen. Satisfactory pacing and sensing parameters were recorded. There was no evidence of diaphragmatic stimulation at maximum device output. The safe sheath was peeled away and the lead was secured in place with 2-0 silk.  In similar fashion the right atrial lead was advanced to the level of the atrial appendage. The active-fixation helix was deployed. There was prominent current of injury. Satisfactory  pacing and sensing parameters were recorded. There was no evidence of diaphragmatic stimulation with pacing at maximum device output. The safe sheath was peeled away and the lead was secured in place with 2-0 silk.  The antibiotic-soaked sponge was removed from the pocket. The pocket was flushed with copious amounts of antibiotic solution. Reinspection showed excellent hemostasis..  The ventricular lead was connected to the generator and appropriate ventricular pacing was seen. Subsequently the atrial lead was also connected. Repeat testing of the lead parameters later showed excellent values.  The entire system was then carefully inserted in the pocket with care been taking that the leads and device assumed a comfortable position without pressure on the incision. Great care was taken that the leads be located deep to the generator. The pocket was then closed in layers using 2 layers of 2-0 Vicryl and cutaneous staples, after which a sterile dressing was applied.  At the end of the procedure the following lead parameters were encountered:  Right atrial lead  sensed P waves 2.1 mV, impedance 1263 ohms, threshold 1.1 V at 0.5 ms pulse width.  Right ventricular lead sensed R waves 9.2 mV, impedance 1091ohms, threshold 0.5 V at 0.5 ms pulse width.  Thurmon Fair, MD, [facility] [phone] office [phone] pager 02/20/2012 10:40 AM

## 2012-02-21 ENCOUNTER — Ambulatory Visit (HOSPITAL_COMMUNITY): Payer: Medicare Other

## 2012-02-21 DIAGNOSIS — R55 Syncope and collapse: Secondary | ICD-10-CM

## 2012-02-21 DIAGNOSIS — R001 Bradycardia, unspecified: Secondary | ICD-10-CM | POA: Diagnosis present

## 2012-02-21 NOTE — Discharge Summary (Signed)
Physician Discharge Summary  Patient ID: Katelyn Morris MRN: [id number] DOB/AGE: 11/23/1922 78 y.o.  Admit date: 02/20/2012 Discharge date: 02/21/2012  Admission Diagnoses:  Bradycardia  Discharge Diagnoses:  Active Problems:  Pre-syncope  Bradycardia  HYPERTENSION   Discharged Condition: stable  Hospital Course:   The patient is 78 years old and has had symptoms of presyncope and chest discomfort. She underwent a nuclear perfusion study that shows no evidence of coronary insufficiency, and in late 2011, had an echocardiogram that showed the absence of major valvular abnormalities. Both studies show preserved left ventricular systolic function. She is known to have borderline left atrial enlargement and by a previous Holter monitor in 2011 had profound persistent sinus bradycardia and evidence of chronotropic incompetence. She has well-treated systemic hypertension and is not on any medications that may cause bradycardia. At her office visits, her heart rate has consistently been around 50 bpm, and she herself has recorded heart rates in the 30s at home.  She presented to [facility] for implantation of a dual chamber PPM.  It was installed without complications.  No pneumothorax on CXR.  The patient was discharged home in stable condition after being seen by Dr. Allyson Sabal.  She will follow-up in 7-10 days for a wound check and staple removal.  Consults: None  Significant Diagnostic Studies:  Procedure performed:  1. Implantation of new dual chamber permanent pacemaker  2. Fluoroscopy  3. Light sedation  Reason for procedure:  Symptomatic bradycardia due to:  Sinus node dysfunction  Second degree atrioventricular block Mobitz type I  Chronotropic incompetence  Procedure performed by:  Thurmon Fair, MD  Complications:  None  Estimated blood loss:  <10 mL  Medications administered during procedure:  Ancef 2g intravenously  Lidocaine 1% 30 mL locally,  Fentanyl 75 mcg  intravenously  Versed 3 mg intravenously  Device details:  Generator Medtronic Revo model RVDR 01 serial number [id number] [id number]  Right atrial lead 5086 MRI-45 cm serial number [id number]  Right ventricular lead 5086 MRI-52 cm serial number [id number]  Procedure details:  After the risks and benefits of the procedure were discussed the patient provided informed consent and was brought to the cardiac cath lab in the fasting state. The patient was prepped and draped in usual sterile fashion. Local anesthesia with 1% lidocaine was administered to to the left infraclavicular area. A 5-6 cm horizontal incision was made parallel with and 2-3 cm caudal to the left clavicle. Using electrocautery and blunt dissection a prepectoral pocket was created down to the level of the pectoralis major muscle fascia. The pocket was carefully inspected for hemostasis. An antibiotic-soaked sponge was placed in the pocket.  Under fluoroscopic guidance and using the modified Seldinger technique 2 separate venipunctures were performed to access the left subclavian vein. no difficulty was encountered accessing the vein. Two J-tip guidewires were subsequently exchanged for two 8 French safe sheaths.  Under fluoroscopic guidance the ventricular lead was advanced to level of the mid to apical right ventricular septum and thet active-fixation helix was deployed. Prominent current of injury was seen. Satisfactory pacing and sensing parameters were recorded. There was no evidence of diaphragmatic stimulation at maximum device output. The safe sheath was peeled away and the lead was secured in place with 2-0 silk.  In similar fashion the right atrial lead was advanced to the level of the atrial appendage. The active-fixation helix was deployed. There was prominent current of injury. Satisfactory pacing and sensing parameters were recorded. There was no  evidence of diaphragmatic stimulation with pacing at maximum device output. The safe sheath  was peeled away and the lead was secured in place with 2-0 silk.  The antibiotic-soaked sponge was removed from the pocket. The pocket was flushed with copious amounts of antibiotic solution. Reinspection showed excellent hemostasis..  The ventricular lead was connected to the generator and appropriate ventricular pacing was seen. Subsequently the atrial lead was also connected. Repeat testing of the lead parameters later showed excellent values.  The entire system was then carefully inserted in the pocket with care been taking that the leads and device assumed a comfortable position without pressure on the incision. Great care was taken that the leads be located deep to the generator. The pocket was then closed in layers using 2 layers of 2-0 Vicryl and cutaneous staples, after which a sterile dressing was applied.  At the end of the procedure the following lead parameters were encountered:  Right atrial lead  sensed P waves 2.1 mV, impedance 1263 ohms, threshold 1.1 V at 0.5 ms pulse width.  Right ventricular lead sensed R waves 9.2 mV, impedance 1091ohms, threshold 0.5 V at 0.5 ms pulse width.  Thurmon Fair, MD, [facility]   CHEST - 2 VIEW  Comparison: 02/13/2009.  Findings: Interval left subclavian bipolar pacemaker with  satisfactory positions of the leads. Overlying skin clips. Normal  sized heart. Clear lungs. The lungs remain mildly hyperexpanded.  Diffuse osteopenia.  IMPRESSION:  1. Interval left subclavian bipolar pacemaker placement without  pneumothorax.  2. Stable mild changes of COPD.  Treatments:  Generator Medtronic Revo model RVDR 01 serial number PTN [id number] H    Right atrial lead 5086 MRI-45 cm serial number [id number] [id number]    Right ventricular lead 5086 MRI-52 cm serial number [id number] V   Discharge Exam: Blood pressure 130/73, pulse 60, temperature 97.7 F (36.5 C), temperature source Oral, resp. rate 17, height 5\' 3"  (1.6 m), weight 57.8 kg (127 lb 6.8 oz), SpO2  96.00%.   Disposition: 01-Home or Self Care  Discharge Orders    Future Orders Please Complete By Expires   Diet - low sodium heart healthy      Increase activity slowly      Call MD for:  redness, tenderness, or signs of infection (pain, swelling, redness, odor or green/yellow discharge around incision site)        Medication List  As of 02/21/2012  3:13 PM   TAKE these medications         ALPRAZolam 0.5 MG tablet   Commonly known as: XANAX   Take 0.25 mg by mouth at bedtime as needed.      aspirin 81 MG tablet   Take 81 mg by mouth daily.      levothyroxine 50 MCG tablet   Commonly known as: SYNTHROID, LEVOTHROID   Take 50 mcg by mouth daily.      losartan-hydrochlorothiazide 50-12.5 MG per tablet   Commonly known as: HYZAAR   Take 1 tablet by mouth daily.      OGEN 0.625 0.75 MG tablet   Generic drug: estropipate   Take 0.75 mg by mouth daily.           Follow-up Information    Follow up with Jetty Berland, PA. (Our office will call you with the appointment date and time. )    Contact information:   [address] [place] [address] [phone]          Signed: Leron Croak,  Leilana Mcquire 02/21/2012, 3:13 PM

## 2012-02-21 NOTE — Progress Notes (Signed)
Subjective:  No CP/SOB  Objective:  Temp:  [97.7 F (36.5 C)-98.2 F (36.8 C)] 97.7 F (36.5 C) (07/30 0805) Pulse Rate:  [60-63] 60  (07/30 0805) Resp:  [13-20] 17  (07/30 0805) BP: (110-174)/(50-77) 130/73 mmHg (07/30 0805) SpO2:  [96 %-97 %] 96 % (07/30 0805) Weight:  [57.8 kg (127 lb 6.8 oz)] 57.8 kg (127 lb 6.8 oz) (07/30 0021) Weight change:   Intake/Output from previous day: 07/29 0701 - 07/30 0700 In: 640 [P.O.:240; I.V.:300; IV Piggyback:100] Out: -   Intake/Output from this shift:    Physical Exam: General appearance: alert, cooperative and no distress Neck: no adenopathy, no carotid bruit, no JVD, supple, symmetrical, trachea midline and thyroid not enlarged, symmetric, no tenderness/mass/nodules Lungs: clear to auscultation bilaterally Heart: regular rate and rhythm, S1, S2 normal, no murmur, click, rub or gallop Extremities: extremities normal, atraumatic, no cyanosis or edema  Lab Results: Results for orders placed during the hospital encounter of 02/20/12 (from the past 48 hour(s))  SURGICAL PCR SCREEN     Status: Normal   Collection Time   02/20/12  7:05 AM      Component Value Range Comment   MRSA, PCR NEGATIVE  NEGATIVE    Staphylococcus aureus NEGATIVE  NEGATIVE     Imaging: Imaging results have been reviewed  Assessment/Plan:   1. Active Problems: 2.  * No active hospital problems. *  3.   Time Spent Directly with Patient:  20 minutes  Length of Stay:  LOS: 1 day   S/P PTVPM implantation yesterday by Dr. Salena Saner. CXR ok. Rep interrogated device this AM. D/C home. ROV with Dr. Zachary George 02/21/2012, 9:03 AM

## 2013-02-04 ENCOUNTER — Other Ambulatory Visit: Payer: Self-pay | Admitting: Cardiovascular Disease

## 2013-02-21 ENCOUNTER — Encounter: Payer: Self-pay | Admitting: *Deleted

## 2013-02-21 LAB — REMOTE PACEMAKER DEVICE
AL AMPLITUDE: 2.3 mv
AL IMPEDENCE PM: 648 Ohm
BATTERY VOLTAGE: 3.02 V
RV LEAD AMPLITUDE: 10.9 mv

## 2013-05-07 ENCOUNTER — Encounter: Payer: Self-pay | Admitting: Cardiovascular Disease

## 2013-05-08 ENCOUNTER — Ambulatory Visit: Payer: Medicare Other

## 2013-05-22 ENCOUNTER — Encounter: Payer: Self-pay | Admitting: *Deleted

## 2013-05-22 LAB — REMOTE PACEMAKER DEVICE
AL AMPLITUDE: 2.2605 mv
AL IMPEDENCE PM: 600 Ohm
ATRIAL PACING PM: 64.79
BAMS-0001: 170 {beats}/min
BATTERY VOLTAGE: 3.02 V
RV LEAD AMPLITUDE: 11.9392 mv
RV LEAD IMPEDENCE PM: 432 Ohm
VENTRICULAR PACING PM: 0.02

## 2013-08-05 ENCOUNTER — Encounter: Payer: Self-pay | Admitting: Cardiovascular Disease

## 2013-08-05 ENCOUNTER — Ambulatory Visit (INDEPENDENT_AMBULATORY_CARE_PROVIDER_SITE_OTHER): Payer: Medicare HMO | Admitting: Cardiovascular Disease

## 2013-08-05 VITALS — BP 134/72 | HR 64 | Resp 16 | Ht 63.0 in | Wt 127.8 lb

## 2013-08-05 DIAGNOSIS — R600 Localized edema: Secondary | ICD-10-CM | POA: Insufficient documentation

## 2013-08-05 DIAGNOSIS — R001 Bradycardia, unspecified: Secondary | ICD-10-CM

## 2013-08-05 DIAGNOSIS — Z950 Presence of cardiac pacemaker: Secondary | ICD-10-CM

## 2013-08-05 DIAGNOSIS — R609 Edema, unspecified: Secondary | ICD-10-CM

## 2013-08-05 DIAGNOSIS — I10 Essential (primary) hypertension: Secondary | ICD-10-CM

## 2013-08-05 DIAGNOSIS — I498 Other specified cardiac arrhythmias: Secondary | ICD-10-CM

## 2013-08-05 LAB — MDC_IDC_ENUM_SESS_TYPE_INCLINIC
Brady Statistic AP VS Percent: 66.7 %
Lead Channel Impedance Value: 432 Ohm
Lead Channel Impedance Value: 608 Ohm
Lead Channel Pacing Threshold Amplitude: 1 V
Lead Channel Pacing Threshold Pulse Width: 0.4 ms
Lead Channel Pacing Threshold Pulse Width: 0.4 ms
Lead Channel Setting Sensing Sensitivity: 0.9 mV
MDC IDC MSMT BATTERY VOLTAGE: 3.02 V
MDC IDC MSMT LEADCHNL RA PACING THRESHOLD AMPLITUDE: 1 V
MDC IDC MSMT LEADCHNL RA SENSING INTR AMPL: 3 mV
MDC IDC MSMT LEADCHNL RV SENSING INTR AMPL: 13 mV
MDC IDC SET LEADCHNL RA PACING AMPLITUDE: 2 V
MDC IDC SET LEADCHNL RV PACING AMPLITUDE: 2 V
MDC IDC SET LEADCHNL RV PACING PULSEWIDTH: 0.4 ms
MDC IDC STAT BRADY AP VP PERCENT: 0.1 % — AB
MDC IDC STAT BRADY AS VP PERCENT: 0.1 % — AB
MDC IDC STAT BRADY AS VS PERCENT: 33.2 %

## 2013-08-05 LAB — PACEMAKER DEVICE OBSERVATION

## 2013-08-05 NOTE — Assessment & Plan Note (Signed)
Response to external compression and the presence of slight asymmetry supports peripheral venous insufficiency as the cause. There are no other physical findings to suggest hypervolemia. She does not have signs or symptoms of congestive heart failure.

## 2013-08-05 NOTE — Progress Notes (Signed)
Patient ID: Katelyn Morris, female   DOB: 04-Feb-1923, 80 y.o.   MRN: [id number]      Reason for office visit Pacemaker followup, hypertension, lower extremity edema  Katelyn Morris is almost 80 years old and returns for followup on her dual chamber permanent pacemaker in systemic hypertension. She also has chronic lower extremity edema that has responded well to treatment with compression stockings. Since her last, she has not had any episodes of dizziness or syncope and has not experienced falls. She denies chest pain or shortness of breath. The swelling is always a little worse in her left ankle but generally well-controlled as long as she wears the compression hose. Her pacemaker "sticks out" since she is extremely slender. Sometimes the area becomes irritated from her bra strap.   Allergies  Allergen Reactions  . Sulfonamide Derivatives     Current Outpatient Prescriptions  Medication Sig Dispense Refill  . ALPRAZolam (XANAX) 0.5 MG tablet Take 0.25 mg by mouth at bedtime as needed.      Marland Kitchen aspirin 81 MG tablet Take 81 mg by mouth daily.      Marland Kitchen estropipate (OGEN 0.625) 0.75 MG tablet Take 0.75 mg by mouth every other day.       . levothyroxine (SYNTHROID, LEVOTHROID) 50 MCG tablet Take 50 mcg by mouth daily.      Marland Kitchen losartan-hydrochlorothiazide (HYZAAR) 50-12.5 MG per tablet Take 1 tablet by mouth daily.       No current facility-administered medications for this visit.    Past Medical History  Diagnosis Date  . Hypertension   . Dysrhythmia     Bradycardia  . Heart murmur   . Pacemaker 02/20/2012    dual chamber  . Hypothyroidism   . Arthritis     in knees per patient    Past Surgical History  Procedure Laterality Date  . Insert / replace / remove pacemaker    . Abdominal hysterectomy    . Bladder tack    . Rotator cuff repair      bilateral    No family history on file.  History   Social History  . Marital Status: Widowed    Spouse Name: N/A    Number of  Children: N/A  . Years of Education: N/A   Occupational History  . Not on file.   Social History Main Topics  . Smoking status: Never Smoker   . Smokeless tobacco: Never Used  . Alcohol Use: No  . Drug Use: No  . Sexual Activity: No   Other Topics Concern  . Not on file   Social History Narrative  . No narrative on file    Review of systems: The patient specifically denies any chest pain at rest or with exertion, dyspnea at rest or with exertion, orthopnea, paroxysmal nocturnal dyspnea, syncope, palpitations, focal neurological deficits, intermittent claudication, unexplained weight gain, cough, hemoptysis or wheezing.  The patient also denies abdominal pain, nausea, vomiting, dysphagia, diarrhea, constipation, polyuria, polydipsia, dysuria, hematuria, frequency, urgency, abnormal bleeding or bruising, fever, chills, unexpected weight changes, mood swings, change in skin or hair texture, change in voice quality, auditory or visual problems, allergic reactions or rashes, new musculoskeletal complaints other than usual "aches and pains".   PHYSICAL EXAM BP 134/72  Pulse 64  Resp 16  Ht $R'5\' 3"'Vz$  (1.6 m)  Wt 127 lb 12.8 oz (57.97 kg)  BMI 22.64 kg/m2  General: Alert, oriented x3, no distress Head: no evidence of trauma, PERRL, EOMI, no exophtalmos or lid  lag, no myxedema, no xanthelasma; normal ears, nose and oropharynx Neck: normal jugular venous pulsations and no hepatojugular reflux; brisk carotid pulses without delay and no carotid bruits Chest: clear to auscultation, no signs of consolidation by percussion or palpation, normal fremitus, symmetrical and full respiratory excursions; left subclavian pacemaker site looks healthy without redness swelling or warmth but the skin is under some tension. It slides easily over the surface of the pacemaker without any signs of undermining. Cardiovascular: normal position and quality of the apical impulse, regular rhythm, normal first and  second heart sounds, no murmurs, rubs or gallops Abdomen: no tenderness or distention, no masses by palpation, no abnormal pulsatility or arterial bruits, normal bowel sounds, no hepatosplenomegaly Extremities: no clubbing, cyanosis or edema; 2+ radial, ulnar and brachial pulses bilaterally; 2+ right femoral, posterior tibial and dorsalis pedis pulses; 2+ left femoral, posterior tibial and dorsalis pedis pulses; no subclavian or femoral bruits Neurological: grossly nonfocal   EKG: Atrial paced ventricular sensed, T wave inversion in the inferior and lateral leads different from previous tracing  Device interrogation shows normal lead parameters. 2 extremely brief episodes of mode switch are recorded and are consistent with atrial tachycardia 2 episodes of nonsustained ventricular tachycardia are also seen less than 2 seconds in duration. All these are asymptomatic.  67% atrial pacing virtually no ventricular pacing  Lipid Panel     Component Value Date/Time   CHOL 168 09/18/2009 1345   TRIG 163 09/18/2009 1345   HDL 62 09/18/2009 1345   LDLCALC 73 09/18/2009 1345    BMET    Component Value Date/Time   NA 139 03/04/2010 0950   K 4.3 03/04/2010 0950   CL 104 03/04/2010 0950   CO2 29 03/04/2010 0950   GLUCOSE 95 03/04/2010 0950   BUN 14 03/04/2010 0950   CREATININE 0.87 03/04/2010 0950   CALCIUM 8.9 03/04/2010 0950   GFRNONAA >60 03/04/2010 0950   GFRAA  Value: >60        The eGFR has been calculated using the MDRD equation. This calculation has not been validated in all clinical situations. eGFR's persistently <60 mL/min signify possible Chronic Kidney Disease. 03/04/2010 0950     ASSESSMENT AND PLAN No problem-specific assessment & plan notes found for this encounter.  Orders Placed This Encounter  Procedures  . EKG 12-Lead   No orders of the defined types were placed in this encounter.    Holli Humbles, MD, [place] [phone] office [phone]  pager

## 2013-08-05 NOTE — Assessment & Plan Note (Signed)
Good control

## 2013-08-05 NOTE — Assessment & Plan Note (Signed)
Devices implanted roughly a year and a half ago for chronotropic incompetence with symptomatic bradycardia. Heart rate histogram distribution is favorable. Lead parameters are excellent. For a few episodes of atrial and ventricular tachycardia that were asymptomatic and are probably too brief to warrant further investigation or therapy. There is no ventricular pacing. No prominent changes are made to device settings today.

## 2013-08-05 NOTE — Patient Instructions (Signed)
Remote monitoring is used to monitor your pacemaker from home. This monitoring reduces the number of office visits required to check your device to one time per year. It allows us to keep an eye on the functioning of your device to ensure it is working properly. You are scheduled for a device check from home on 11-06-2013. You may send your transmission at any time that day. If you have a wireless device, the transmission will be sent automatically. After your physician reviews your transmission, you will receive a postcard with your next transmission date.  Your physician recommends that you schedule a follow-up appointment in: 12 months with Dr.Croitoru   

## 2013-08-06 ENCOUNTER — Encounter: Payer: Self-pay | Admitting: Cardiovascular Disease

## 2013-08-08 IMAGING — CR DG CHEST 2V
2 series · 2 of 2 positions shown · non-contrast
Comparison: No priors.

CLINICAL DATA: Preoperative evaluation.

CHEST - 2 VIEW

[w chest pa]
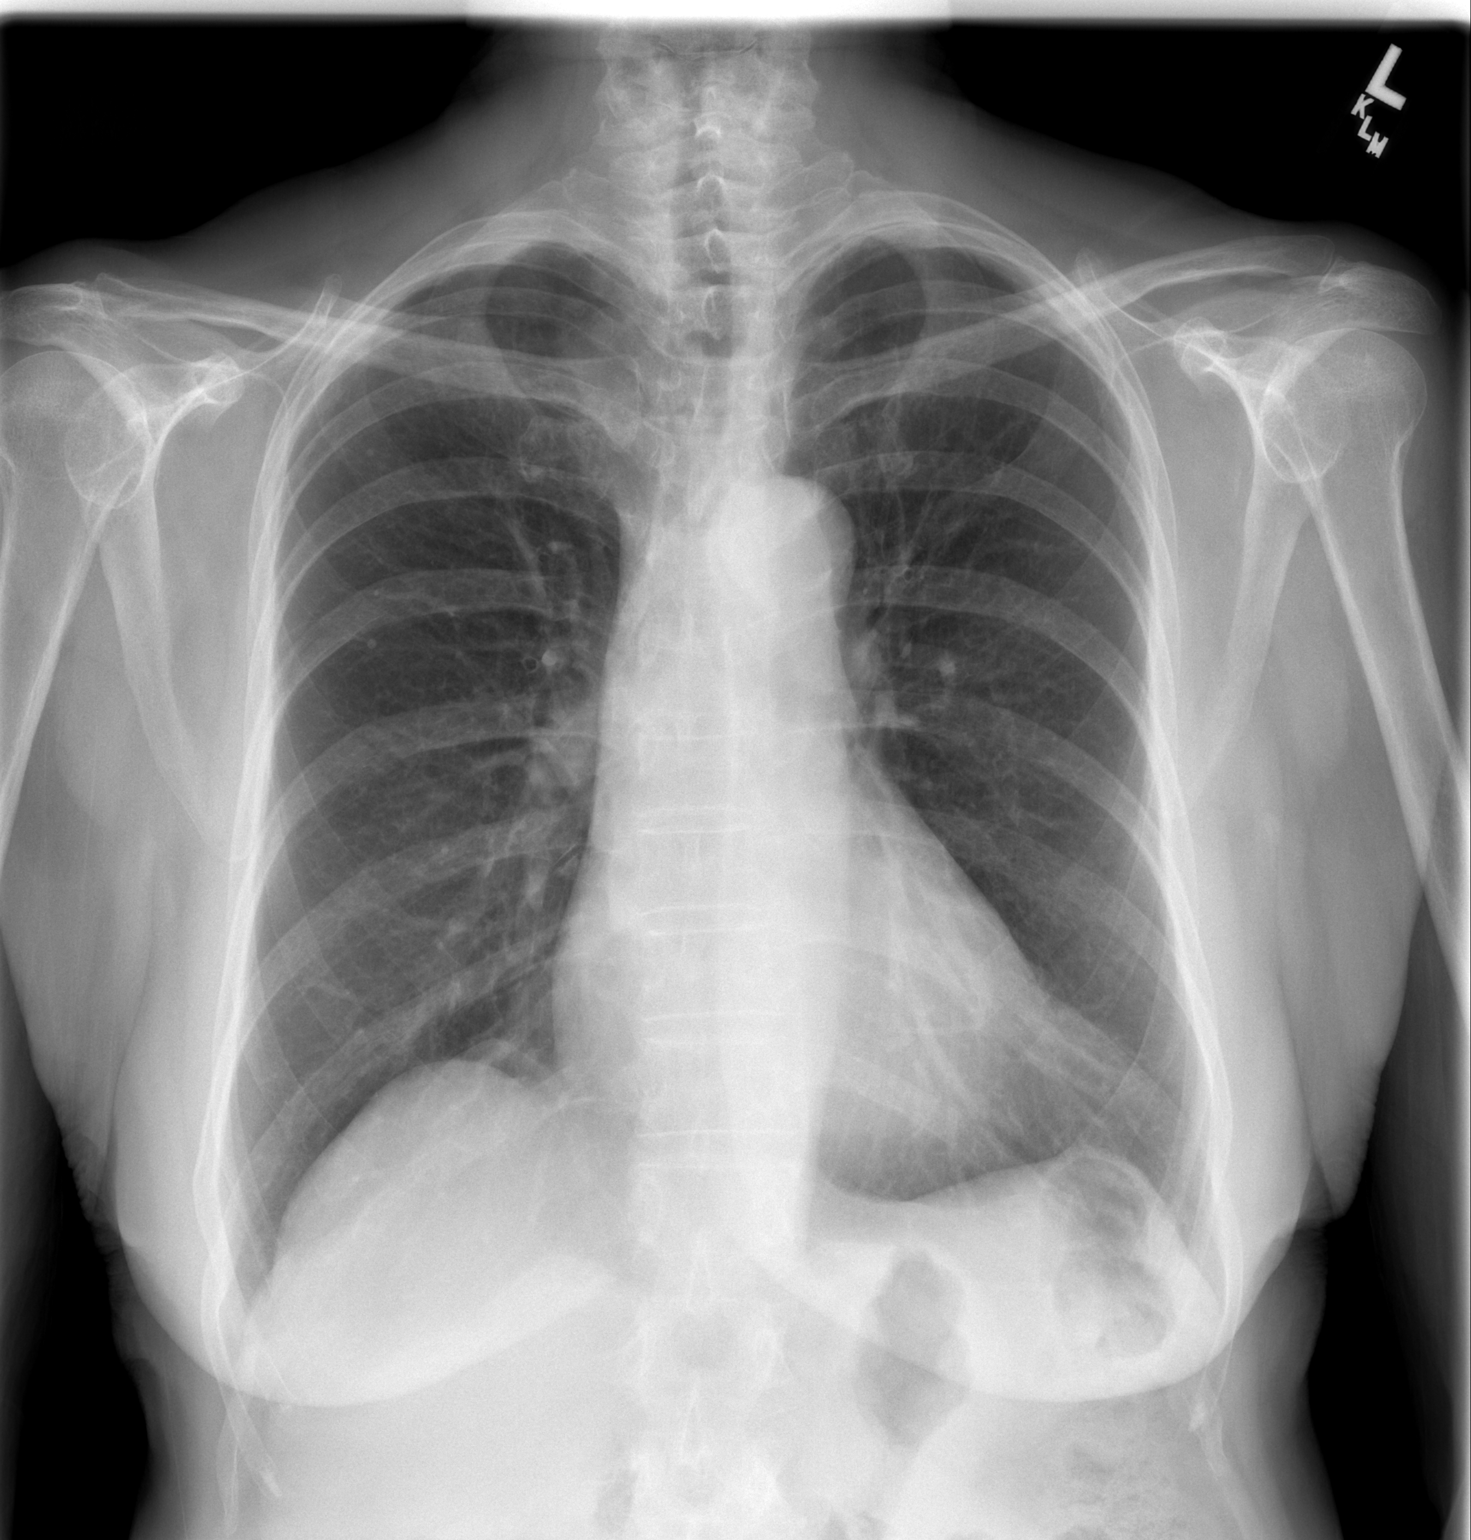

[w chest lat]
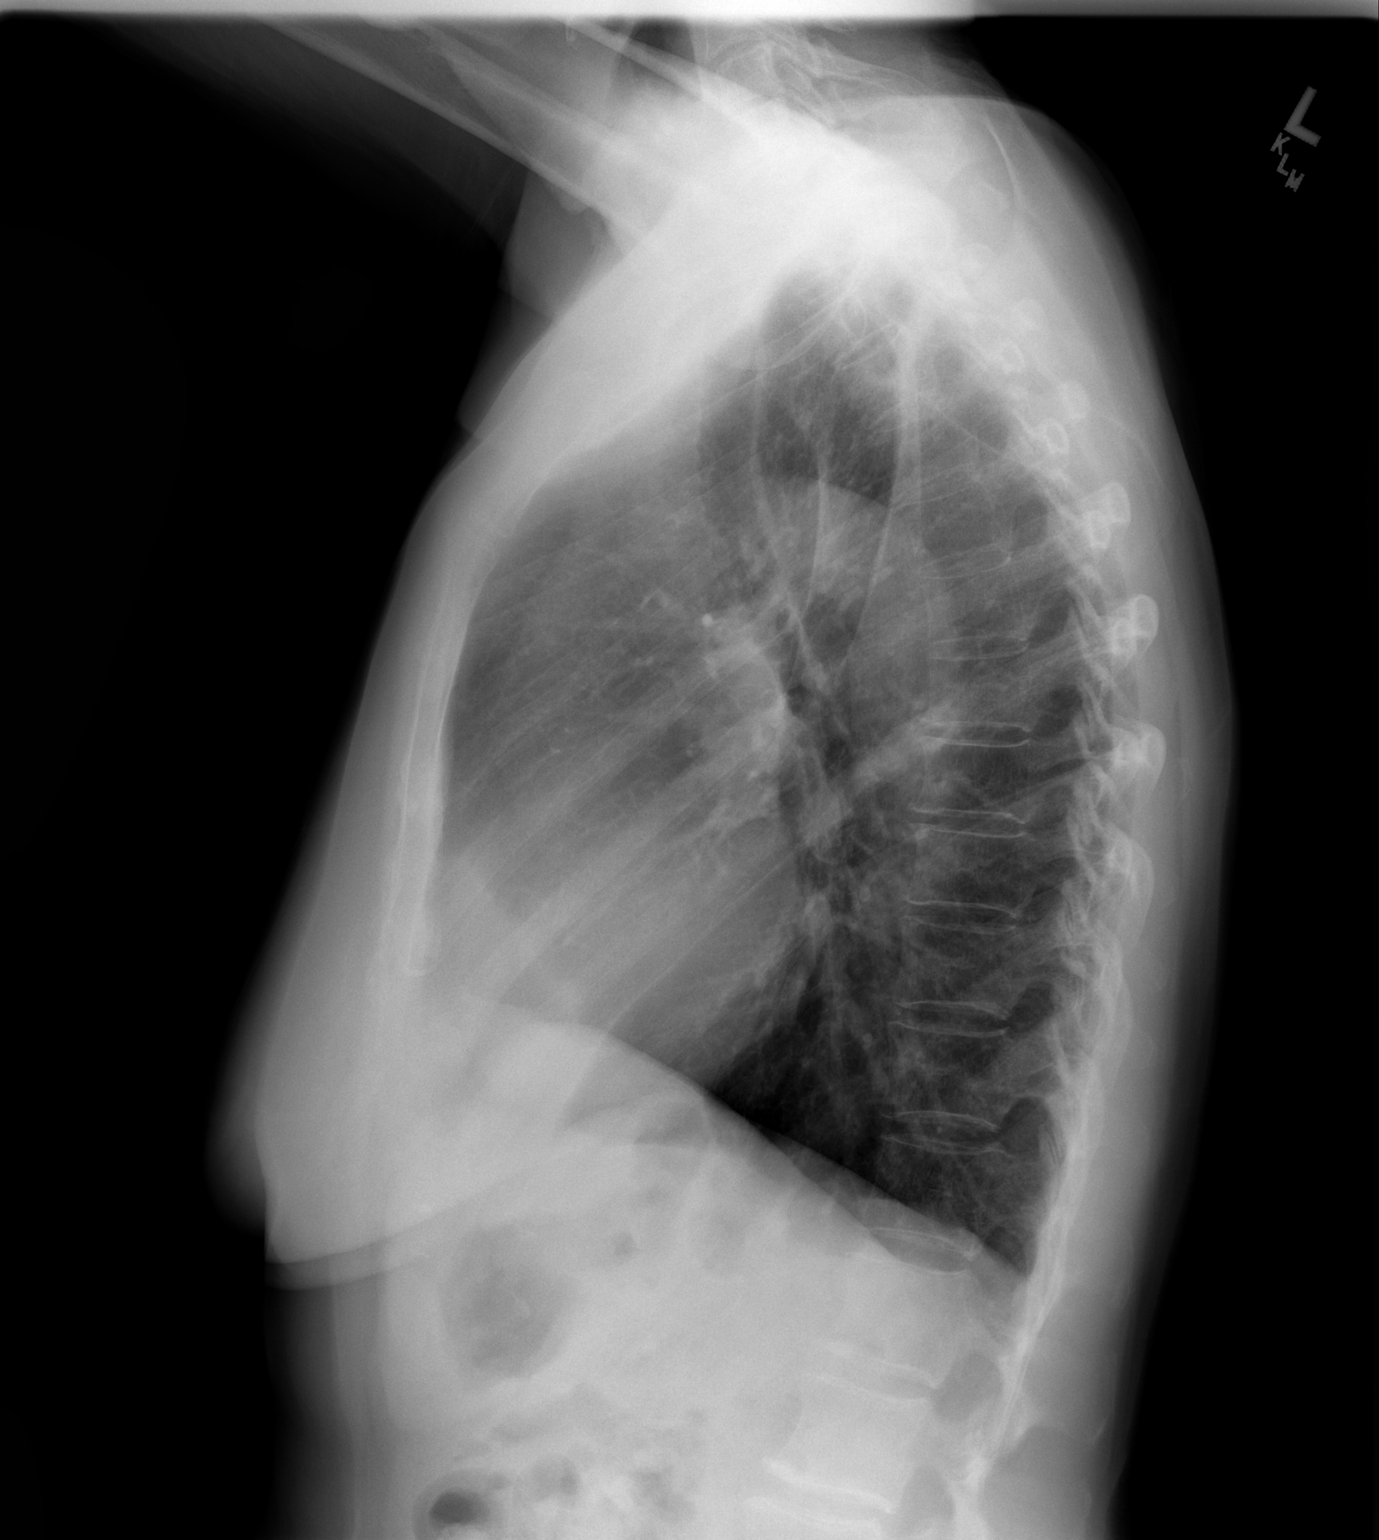

[2 of 2 positions shown; findings below may reference images not displayed]

FINDINGS: Tiny high density nodules are seen in the periphery of
the right upper lobe, most compatible with calcified granulomas.
No other larger more suspicious appearing pulmonary nodules or
masses are otherwise noted.  There is mild hyperexpansion of the
lungs with flattening of the hemidiaphragms, slight increased
retrosternal air space and pruning of the pulmonary vasculature in
the periphery, suggesting underlying COPD.  No acute consolidative
airspace disease.  No pleural effusions.  Heart size is normal.
Mediastinal contours are unremarkable.  Atherosclerosis in the
thoracic aorta.
IMPRESSION: 1.  No radiographic evidence of acute cardiopulmonary disease.
2.  Morphologic changes suggesting underlying COPD, as above.
3.  Atherosclerosis.
4.  Multiple tiny calcified granulomas in the right upper lobe.

## 2013-08-15 IMAGING — CR DG CHEST 2V
2 series · 2 of 2 positions shown · non-contrast
Comparison: 02/13/2009.

CLINICAL DATA: Arm pain following pacemaker placement.

CHEST - 2 VIEW

[w chest pa]
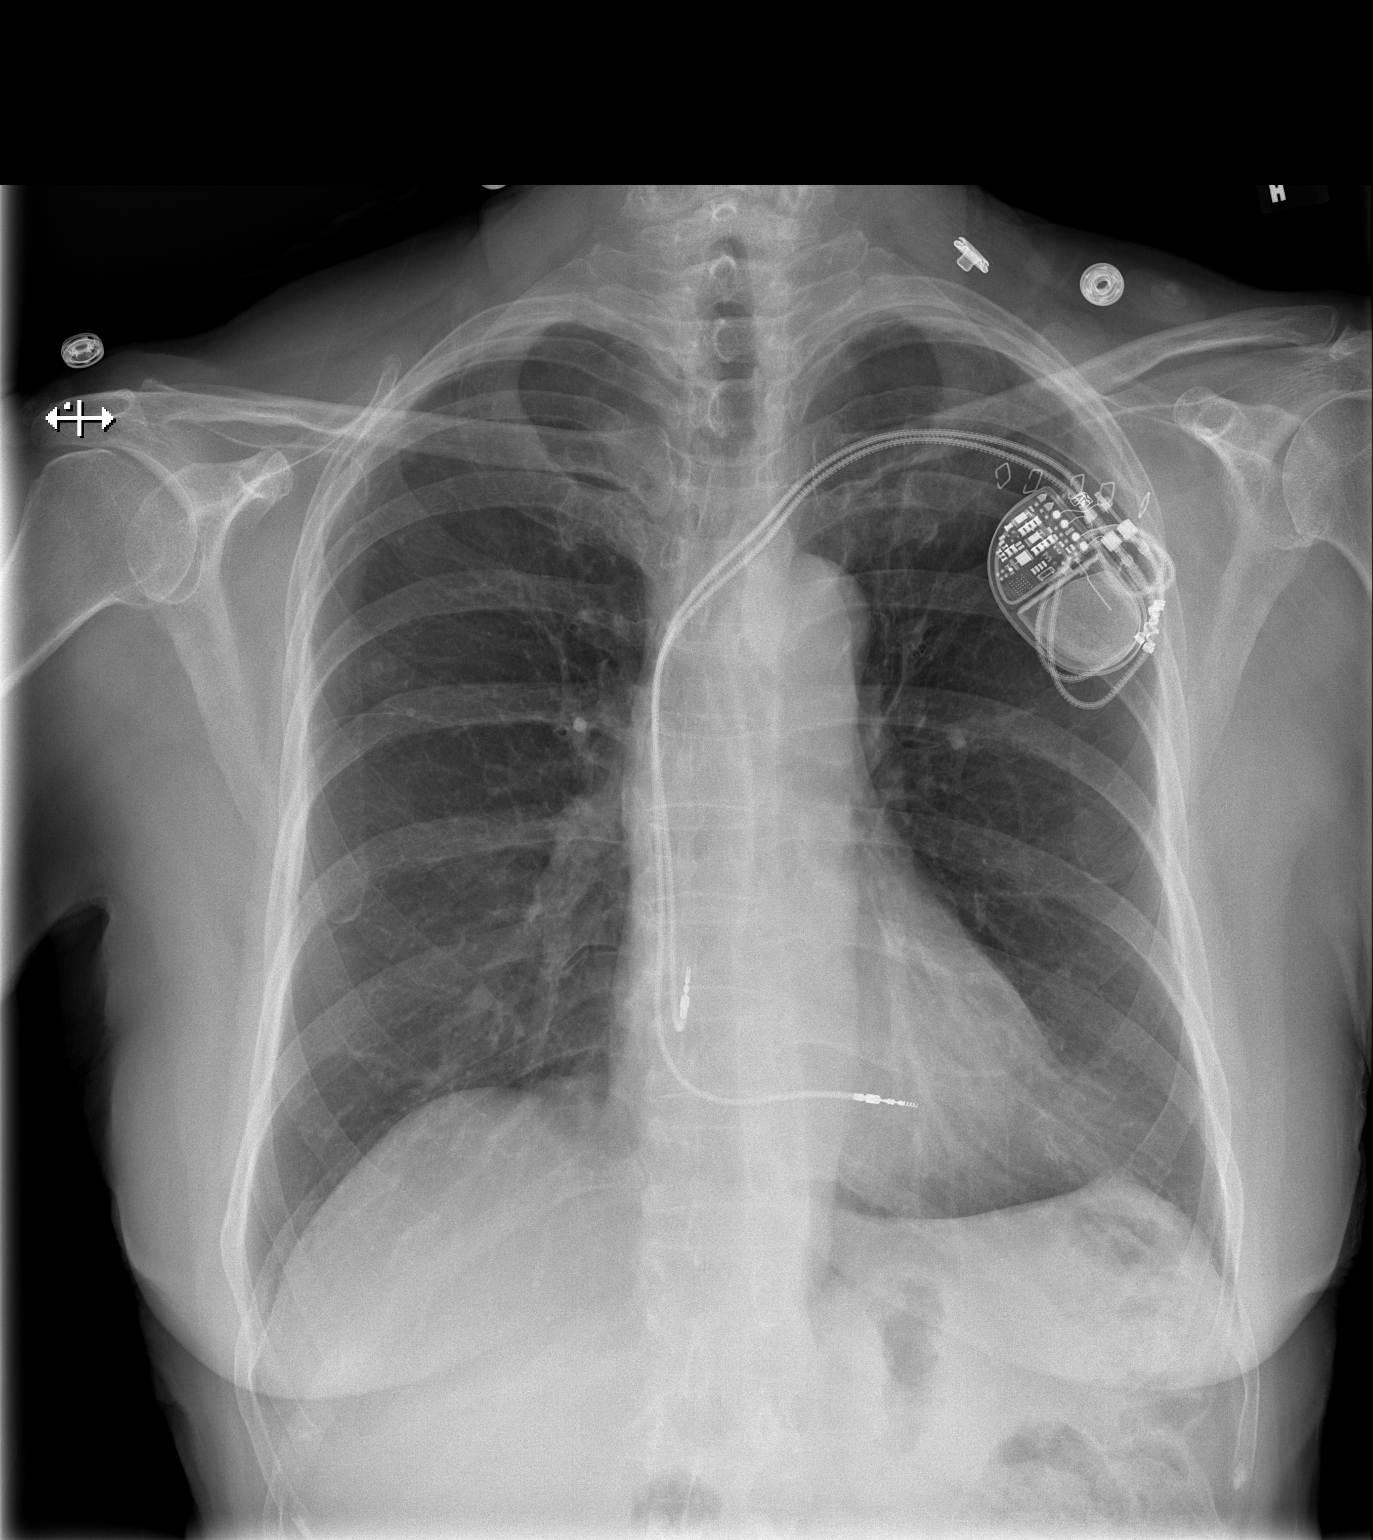

[w chest lat]
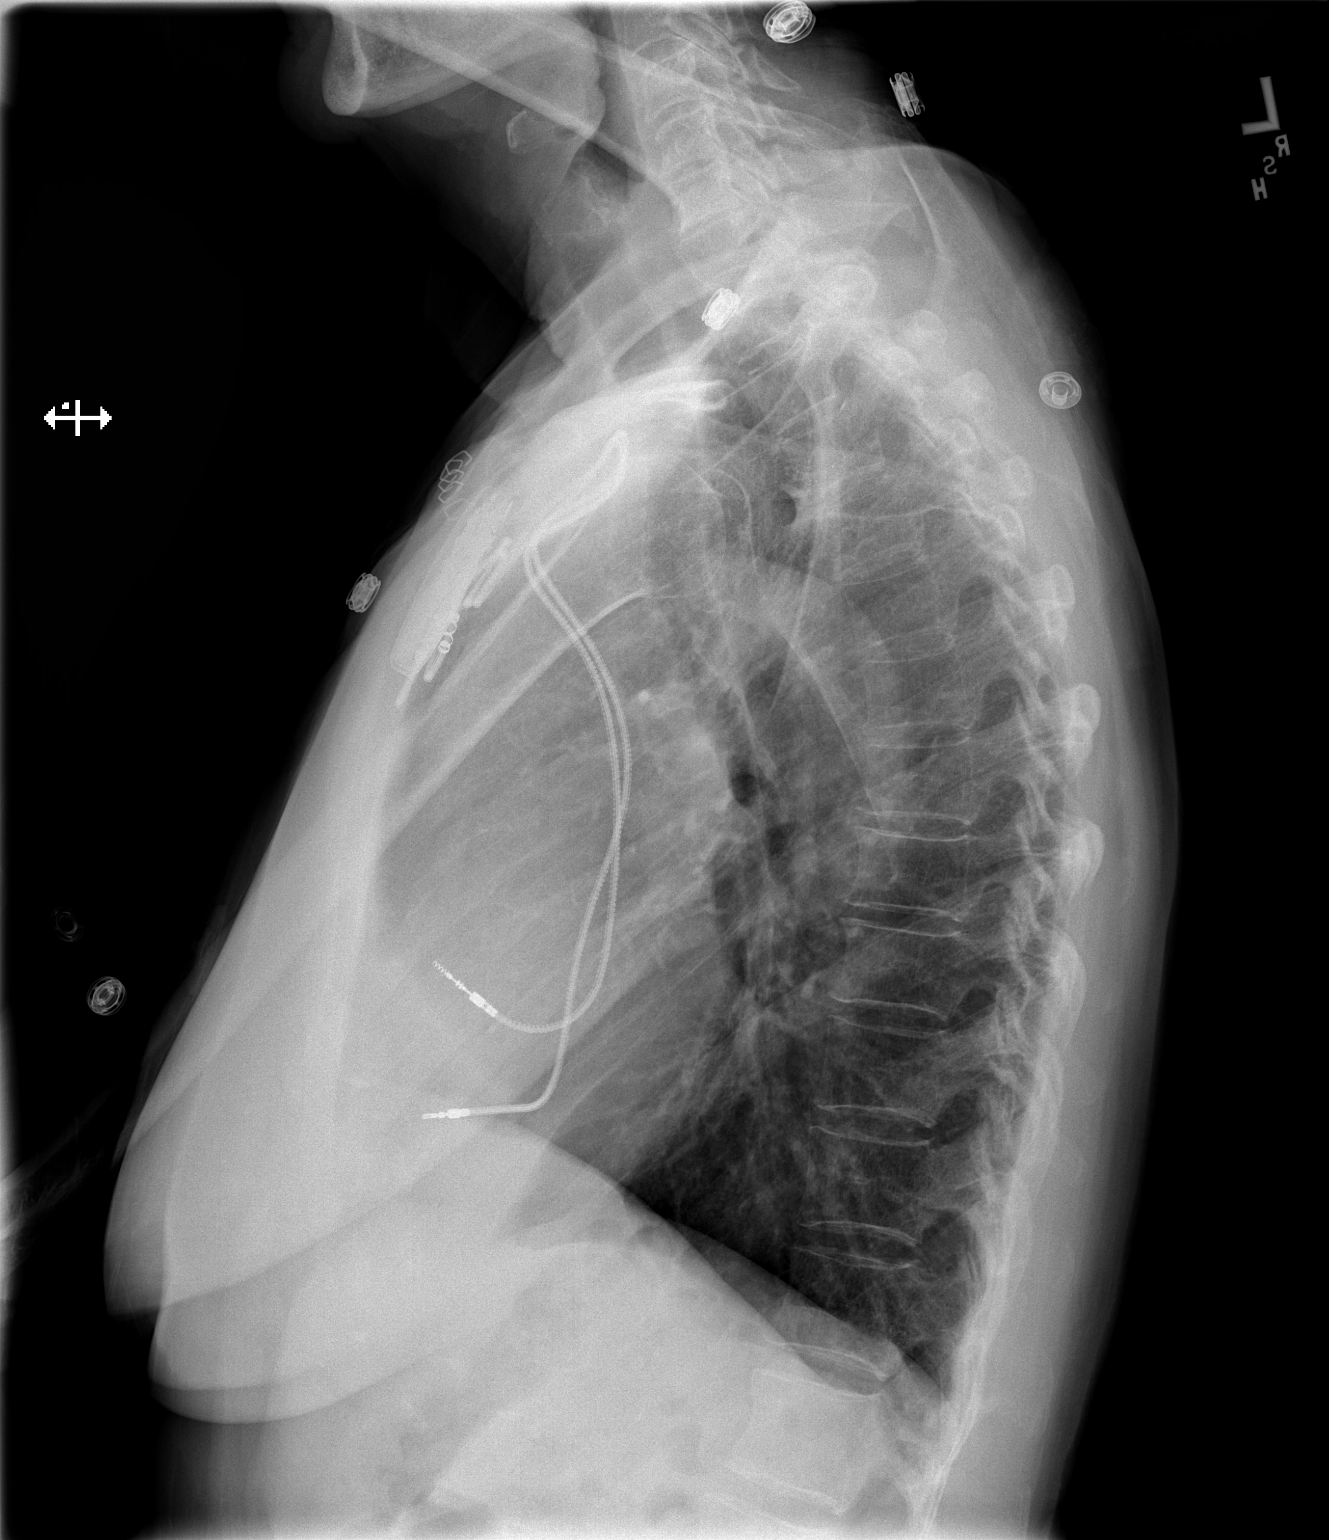

[2 of 2 positions shown; findings below may reference images not displayed]

FINDINGS: Interval left subclavian bipolar pacemaker with
satisfactory positions of the leads.  Overlying skin clips.  Normal
sized heart.  Clear lungs.  The lungs remain mildly hyperexpanded.
Diffuse osteopenia.
IMPRESSION: 1.  Interval left subclavian bipolar pacemaker placement without
pneumothorax.
2.  Stable mild changes of COPD.

## 2013-11-06 ENCOUNTER — Ambulatory Visit (INDEPENDENT_AMBULATORY_CARE_PROVIDER_SITE_OTHER): Payer: Medicare HMO | Admitting: *Deleted

## 2013-11-06 ENCOUNTER — Encounter: Payer: Self-pay | Admitting: Internal Medicine

## 2013-11-06 DIAGNOSIS — R001 Bradycardia, unspecified: Secondary | ICD-10-CM

## 2013-11-06 DIAGNOSIS — I498 Other specified cardiac arrhythmias: Secondary | ICD-10-CM

## 2013-11-12 LAB — MDC_IDC_ENUM_SESS_TYPE_REMOTE
Battery Voltage: 3 V
Brady Statistic AP VS Percent: 67.71 %
Brady Statistic AS VP Percent: 0 %
Brady Statistic AS VS Percent: 32.28 %
Lead Channel Impedance Value: 424 Ohm
Lead Channel Impedance Value: 576 Ohm
Lead Channel Sensing Intrinsic Amplitude: 2.1301
Lead Channel Setting Pacing Amplitude: 2 V
Lead Channel Setting Pacing Amplitude: 2.5 V
Lead Channel Setting Pacing Pulse Width: 0.4 ms
MDC IDC MSMT LEADCHNL RV SENSING INTR AMPL: 10.2336
MDC IDC SESS DTM: 20150415173327
MDC IDC SET LEADCHNL RV SENSING SENSITIVITY: 0.9 mV
MDC IDC SET ZONE DETECTION INTERVAL: 400 ms
MDC IDC STAT BRADY AP VP PERCENT: 0.01 %
MDC IDC STAT BRADY RA PERCENT PACED: 67.72 %
MDC IDC STAT BRADY RV PERCENT PACED: 0.01 %
Zone Setting Detection Interval: 350 ms

## 2013-11-27 ENCOUNTER — Encounter: Payer: Self-pay | Admitting: Cardiology

## 2014-02-10 ENCOUNTER — Ambulatory Visit (INDEPENDENT_AMBULATORY_CARE_PROVIDER_SITE_OTHER): Payer: Medicare HMO | Admitting: *Deleted

## 2014-02-10 DIAGNOSIS — Z950 Presence of cardiac pacemaker: Secondary | ICD-10-CM

## 2014-02-10 DIAGNOSIS — R001 Bradycardia, unspecified: Secondary | ICD-10-CM

## 2014-02-10 DIAGNOSIS — I498 Other specified cardiac arrhythmias: Secondary | ICD-10-CM

## 2014-02-10 NOTE — Progress Notes (Signed)
Remote pacemaker transmission.   

## 2014-02-17 LAB — MDC_IDC_ENUM_SESS_TYPE_REMOTE
Battery Voltage: 3 V
Brady Statistic AS VS Percent: 32.37 %
Lead Channel Impedance Value: 440 Ohm
Lead Channel Impedance Value: 576 Ohm
Lead Channel Sensing Intrinsic Amplitude: 2.0867
Lead Channel Setting Pacing Amplitude: 2 V
Lead Channel Setting Pacing Amplitude: 2.5 V
Lead Channel Setting Pacing Pulse Width: 0.4 ms
MDC IDC MSMT LEADCHNL RV SENSING INTR AMPL: 10.9158
MDC IDC SESS DTM: 20150720142828
MDC IDC SET LEADCHNL RV SENSING SENSITIVITY: 0.9 mV
MDC IDC SET ZONE DETECTION INTERVAL: 350 ms
MDC IDC SET ZONE DETECTION INTERVAL: 400 ms
MDC IDC STAT BRADY AP VP PERCENT: 0.02 %
MDC IDC STAT BRADY AP VS PERCENT: 67.61 %
MDC IDC STAT BRADY AS VP PERCENT: 0 %
MDC IDC STAT BRADY RA PERCENT PACED: 67.63 %
MDC IDC STAT BRADY RV PERCENT PACED: 0.02 %

## 2014-02-25 ENCOUNTER — Encounter: Payer: Self-pay | Admitting: Cardiology

## 2014-02-28 ENCOUNTER — Encounter: Payer: Self-pay | Admitting: Cardiovascular Disease

## 2014-05-19 ENCOUNTER — Ambulatory Visit (INDEPENDENT_AMBULATORY_CARE_PROVIDER_SITE_OTHER): Payer: Medicare HMO | Admitting: *Deleted

## 2014-05-19 DIAGNOSIS — R001 Bradycardia, unspecified: Secondary | ICD-10-CM

## 2014-05-19 NOTE — Progress Notes (Signed)
Remote pacmaker transmission.

## 2014-05-21 LAB — MDC_IDC_ENUM_SESS_TYPE_REMOTE
Battery Voltage: 3 V
Brady Statistic AP VP Percent: 0.01 %
Brady Statistic AS VS Percent: 31.72 %
Lead Channel Impedance Value: 600 Ohm
Lead Channel Sensing Intrinsic Amplitude: 1.9562
Lead Channel Sensing Intrinsic Amplitude: 9.8925
Lead Channel Setting Pacing Amplitude: 2 V
Lead Channel Setting Pacing Pulse Width: 0.4 ms
MDC IDC MSMT LEADCHNL RV IMPEDANCE VALUE: 424 Ohm
MDC IDC SESS DTM: 20151026142150
MDC IDC SET LEADCHNL RV PACING AMPLITUDE: 2.5 V
MDC IDC SET LEADCHNL RV SENSING SENSITIVITY: 0.9 mV
MDC IDC SET ZONE DETECTION INTERVAL: 350 ms
MDC IDC SET ZONE DETECTION INTERVAL: 400 ms
MDC IDC STAT BRADY AP VS PERCENT: 68.26 %
MDC IDC STAT BRADY AS VP PERCENT: 0 %
MDC IDC STAT BRADY RA PERCENT PACED: 68.28 %
MDC IDC STAT BRADY RV PERCENT PACED: 0.01 %

## 2014-05-28 ENCOUNTER — Encounter: Payer: Self-pay | Admitting: Cardiology

## 2014-06-05 ENCOUNTER — Encounter: Payer: Self-pay | Admitting: Cardiovascular Disease

## 2014-06-16 ENCOUNTER — Encounter: Payer: Self-pay | Admitting: Gastroenterology

## 2014-07-03 ENCOUNTER — Encounter (HOSPITAL_COMMUNITY): Payer: Self-pay | Admitting: Cardiovascular Disease

## 2014-08-05 ENCOUNTER — Encounter: Payer: Self-pay | Admitting: Cardiovascular Disease

## 2014-08-05 ENCOUNTER — Ambulatory Visit (INDEPENDENT_AMBULATORY_CARE_PROVIDER_SITE_OTHER): Payer: Medicare HMO | Admitting: Cardiovascular Disease

## 2014-08-05 VITALS — BP 176/82 | HR 63 | Resp 16 | Ht 63.0 in | Wt 129.1 lb

## 2014-08-05 DIAGNOSIS — R0989 Other specified symptoms and signs involving the circulatory and respiratory systems: Secondary | ICD-10-CM

## 2014-08-05 DIAGNOSIS — R001 Bradycardia, unspecified: Secondary | ICD-10-CM | POA: Diagnosis not present

## 2014-08-05 DIAGNOSIS — E785 Hyperlipidemia, unspecified: Secondary | ICD-10-CM

## 2014-08-05 DIAGNOSIS — R011 Cardiac murmur, unspecified: Secondary | ICD-10-CM

## 2014-08-05 DIAGNOSIS — I10 Essential (primary) hypertension: Secondary | ICD-10-CM

## 2014-08-05 DIAGNOSIS — Z950 Presence of cardiac pacemaker: Secondary | ICD-10-CM

## 2014-08-05 DIAGNOSIS — I495 Sick sinus syndrome: Secondary | ICD-10-CM

## 2014-08-05 LAB — MDC_IDC_ENUM_SESS_TYPE_INCLINIC
Battery Voltage: 3 V
Brady Statistic AP VP Percent: 0.01 %
Brady Statistic AP VS Percent: 67.82 %
Brady Statistic AS VP Percent: 0 %
Date Time Interrogation Session: 20160112125124
Lead Channel Impedance Value: 432 Ohm
Lead Channel Sensing Intrinsic Amplitude: 2.7822
Lead Channel Setting Pacing Amplitude: 2 V
Lead Channel Setting Pacing Amplitude: 2.5 V
Lead Channel Setting Pacing Pulse Width: 0.4 ms
MDC IDC MSMT LEADCHNL RA IMPEDANCE VALUE: 552 Ohm
MDC IDC MSMT LEADCHNL RV SENSING INTR AMPL: 11.5981
MDC IDC SET LEADCHNL RV SENSING SENSITIVITY: 0.9 mV
MDC IDC SET ZONE DETECTION INTERVAL: 350 ms
MDC IDC SET ZONE DETECTION INTERVAL: 400 ms
MDC IDC STAT BRADY AS VS PERCENT: 32.17 %
MDC IDC STAT BRADY RA PERCENT PACED: 67.83 %
MDC IDC STAT BRADY RV PERCENT PACED: 0.01 %

## 2014-08-05 NOTE — Patient Instructions (Signed)
Remote monitoring is used to monitor your pacemaker from home. This monitoring reduces the number of office visits required to check your device to one time per year. It allows [place] to keep an eye on the functioning of your device to ensure it is working properly. You are scheduled for a device check from home on 11-04-2014. You may send your transmission at any time that day. If you have a wireless device, the transmission will be sent automatically. After your physician reviews your transmission, you will receive a postcard with your next transmission date.  Your physician recommends that you schedule a follow-up appointment in:12 months with Dr.Croitoru  Your physician has requested that you have an echocardiogram. Echocardiography is a painless test that uses sound waves to create images of your heart. It provides your doctor with information about the size and shape of your heart and how well your heart's chambers and valves are working. This procedure takes approximately one hour. There are no restrictions for this procedure.  Your physician has requested that you have a carotid duplex. This test is an ultrasound of the carotid arteries in your neck. It looks at blood flow through these arteries that supply the brain with blood. Allow one hour for this exam. There are no restrictions or special instructions.  Your physician recommends that you return for lab work whenever possible while fasting.

## 2014-08-06 ENCOUNTER — Encounter: Payer: Self-pay | Admitting: Cardiovascular Disease

## 2014-08-06 DIAGNOSIS — R011 Cardiac murmur, unspecified: Secondary | ICD-10-CM | POA: Insufficient documentation

## 2014-08-06 DIAGNOSIS — R0989 Other specified symptoms and signs involving the circulatory and respiratory systems: Secondary | ICD-10-CM | POA: Insufficient documentation

## 2014-08-06 DIAGNOSIS — I495 Sick sinus syndrome: Secondary | ICD-10-CM

## 2014-08-06 HISTORY — DX: Sick sinus syndrome: I49.5

## 2014-08-06 NOTE — Progress Notes (Signed)
Patient ID: Katelyn Morris, female   DOB: August 18, 1922, 81 y.o.   MRN: [id number]      Reason for office visit Sinus node dysfunction, pacemaker check, hypertension  Katelyn Morris will be 81 years old in a couple of months and continues to live independently with support from her daughter. She has no cardiovascular complaints.  He received a dual-chamber permanent pacemaker for sinus node dysfunction with symptomatic bradycardia and presyncope in July 2013. Was also clear evidence of chronotropic incompetence. She has treated systemic hypertension. An echocardiogram performed in 2011 showed normal left ventricular ejection fraction and no major valvular . A remote nuclear stress test did not show perfusion abnormalities.  Interrogation of her pacemaker today shows normal device function. She has an MRI conditional Medtronic Revo device. There is 68% atrial pacing and virtually no ventricular pacing. Activity monitor shows roughly 1 hour per day of motion. The device has recorded occasional atrial tachycardia usually very brief. She did have a 5 minute episode of sustained atrial tachycardia in December, this was not symptomatic.   Allergies  Allergen Reactions  . Sulfa Antibiotics Swelling  . Sulfonamide Derivatives     Current Outpatient Prescriptions  Medication Sig Dispense Refill  . ALPRAZolam (XANAX) 0.5 MG tablet Take 0.25 mg by mouth at bedtime as needed.    Marland Kitchen aspirin 81 MG tablet Take 81 mg by mouth daily.    Marland Kitchen estropipate (OGEN 0.625) 0.75 MG tablet Take 0.75 mg by mouth every other day.     . levothyroxine (SYNTHROID, LEVOTHROID) 50 MCG tablet Take 50 mcg by mouth daily.    Marland Kitchen losartan-hydrochlorothiazide (HYZAAR) 50-12.5 MG per tablet Take 1 tablet by mouth daily.     No current facility-administered medications for this visit.    Past Medical History  Diagnosis Date  . Hypertension   . Dysrhythmia     Bradycardia  . Heart murmur   . Pacemaker 02/20/2012    dual chamber  .  Hypothyroidism   . Arthritis     in knees per patient    Past Surgical History  Procedure Laterality Date  . Insert / replace / remove pacemaker    . Abdominal hysterectomy    . Bladder tack    . Rotator cuff repair      bilateral  . Permanent pacemaker insertion N/A 02/20/2012    Procedure: PERMANENT PACEMAKER INSERTION;  Surgeon: Thurmon Fair, MD;  Location: [facility];  Service: Cardiovascular;  Laterality: N/A;    No family history on file.  History   Social History  . Marital Status: Widowed    Spouse Name: N/A    Number of Children: N/A  . Years of Education: N/A   Occupational History  . Not on file.   Social History Main Topics  . Smoking status: Never Smoker   . Smokeless tobacco: Never Used  . Alcohol Use: No  . Drug Use: No  . Sexual Activity: No   Other Topics Concern  . Not on file   Social History Narrative    Review of systems: The patient specifically denies any chest pain at rest or with exertion, dyspnea at rest or with exertion, orthopnea, paroxysmal nocturnal dyspnea, syncope, palpitations, focal neurological deficits, intermittent claudication, lower extremity edema, unexplained weight gain, cough, hemoptysis or wheezing.  The patient also denies abdominal pain, nausea, vomiting, dysphagia, diarrhea, constipation, polyuria, polydipsia, dysuria, hematuria, frequency, urgency, abnormal bleeding or bruising, fever, chills, unexpected weight changes, mood swings, change in skin or hair texture,  change in voice quality, auditory or visual problems, allergic reactions or rashes, new musculoskeletal complaints other than usual "aches and pains".   PHYSICAL EXAM BP 176/82 mmHg  Pulse 63  Resp 16  Ht $R'5\' 3"'Qf$  (1.6 m)  Wt 129 lb 1.6 oz (58.559 kg)  BMI 22.87 kg/m2  General: Alert, oriented x3, no distress Head: no evidence of trauma, PERRL, EOMI, no exophtalmos or lid lag, no myxedema, no xanthelasma; normal ears, nose and oropharynx Neck: normal  jugular venous pulsations and no hepatojugular reflux; brisk carotid pulses without delay and a very distinct right carotid bruit Chest: clear to auscultation, no signs of consolidation by percussion or palpation, normal fremitus, symmetrical and full respiratory excursions Cardiovascular: normal position and quality of the apical impulse, regular rhythm, normal first and second heart sounds, no rubs or gallops. There is a grade 2/6 early peaking systolic ejection murmur in the aortic focus that I don't recall hearing at her last appointment Abdomen: no tenderness or distention, no masses by palpation, no abnormal pulsatility or arterial bruits, normal bowel sounds, no hepatosplenomegaly Extremities: no clubbing, cyanosis or edema; 2+ radial, ulnar and brachial pulses bilaterally; 2+ right femoral, posterior tibial and dorsalis pedis pulses; 2+ left femoral, posterior tibial and dorsalis pedis pulses; no subclavian or femoral bruits Neurological: grossly nonfocal   EKG: Atrial paced, ventricular sensed, minor IVCD, QRS duration of 106 ms, QS pattern in leads V1-V2, widespread T-wave flattening.   Lipid Panel     Component Value Date/Time   CHOL 168 09/18/2009 1345   TRIG 163 09/18/2009 1345   HDL 62 09/18/2009 1345   LDLCALC 73 09/18/2009 1345    BMET    Component Value Date/Time   NA 139 03/04/2010 0950   K 4.3 03/04/2010 0950   CL 104 03/04/2010 0950   CO2 29 03/04/2010 0950   GLUCOSE 95 03/04/2010 0950   BUN 14 03/04/2010 0950   CREATININE 0.87 03/04/2010 0950   CALCIUM 8.9 03/04/2010 0950   GFRNONAA >60 03/04/2010 0950   GFRAA  03/04/2010 0950    >60        The eGFR has been calculated using the MDRD equation. This calculation has not been validated in all clinical situations. eGFR's persistently <60 mL/min signify possible Chronic Kidney Disease.     ASSESSMENT AND PLAN  Sinus node dysfunction with normal function of dual chamber Medtronic pacemaker Remote CareLink  download every 3 months, office visit in one year  Hypertension Her blood pressure is unusually high today. Asked her to keep a daily log of her blood pressure and we will discuss whether there is a need for change in therapy when we review the results of ordered tests.  Right carotid bruit This seems to be a new finding. Although there is also a systolic aortic murmur, the bruit is more prominent than one would expect from a radiated murmur. Check carotid duplex ultrasound. She does not describe symptoms of TIA or stroke  Cardiac murmur This is also more prominent than in the past. Check echocardiogram  Repeat a lipid profile since one has not been checked since 2011 and treatment may be necessary if vascular disease is confirmed by carotid Doppler.  Orders Placed This Encounter  Procedures  . Comp Met (CMET)  . Lipid Profile  . Implantable device check  . EKG 12-Lead  . 2D Echocardiogram without contrast  . Carotid duplex   No orders of the defined types were placed in this encounter.    Meagen Limones  Sanda Klein, MD, [facility] [phone] office [phone] pager

## 2014-08-07 LAB — LIPID PANEL
CHOLESTEROL: 208 mg/dL — AB (ref 0–200)
HDL: 45 mg/dL (ref >39)
LDL Cholesterol: 123 mg/dL — ABNORMAL HIGH (ref 0–99)
TRIGLYCERIDES: 202 mg/dL — AB (ref <150)
Total CHOL/HDL Ratio: 4.6 Ratio
VLDL: 40 mg/dL (ref 0–40)

## 2014-08-07 LAB — COMPREHENSIVE METABOLIC PANEL
ALBUMIN: 3.8 g/dL (ref 3.5–5.2)
ALT: 11 U/L (ref 0–35)
AST: 16 U/L (ref 0–37)
Alkaline Phosphatase: 62 U/L (ref 39–117)
BUN: 20 mg/dL (ref 6–23)
CALCIUM: 9.2 mg/dL (ref 8.4–10.5)
CHLORIDE: 104 meq/L (ref 96–112)
CO2: 27 meq/L (ref 19–32)
CREATININE: 0.9 mg/dL (ref 0.50–1.10)
Glucose, Bld: 87 mg/dL (ref 70–99)
POTASSIUM: 4.1 meq/L (ref 3.5–5.3)
Sodium: 140 mEq/L (ref 135–145)
TOTAL PROTEIN: 6.7 g/dL (ref 6.0–8.3)
Total Bilirubin: 0.5 mg/dL (ref 0.2–1.2)

## 2014-08-13 ENCOUNTER — Encounter: Payer: Self-pay | Admitting: Cardiovascular Disease

## 2014-08-15 ENCOUNTER — Encounter (HOSPITAL_COMMUNITY): Payer: Medicare HMO

## 2014-08-15 ENCOUNTER — Ambulatory Visit (HOSPITAL_COMMUNITY): Payer: Medicare HMO

## 2014-08-20 ENCOUNTER — Ambulatory Visit (HOSPITAL_COMMUNITY)
Admission: RE | Admit: 2014-08-20 | Discharge: 2014-08-20 | Disposition: A | Discharge status: Home / Self Care | Payer: Medicare HMO | Source: Ambulatory Visit | Attending: Cardiovascular Disease | Admitting: Cardiovascular Disease

## 2014-08-20 ENCOUNTER — Ambulatory Visit (HOSPITAL_BASED_OUTPATIENT_CLINIC_OR_DEPARTMENT_OTHER)
Admission: RE | Admit: 2014-08-20 | Discharge: 2014-08-20 | Disposition: A | Discharge status: Home / Self Care | Payer: Medicare HMO | Source: Ambulatory Visit | Attending: Cardiovascular Disease | Admitting: Cardiovascular Disease

## 2014-08-20 DIAGNOSIS — I10 Essential (primary) hypertension: Secondary | ICD-10-CM | POA: Diagnosis not present

## 2014-08-20 DIAGNOSIS — R0989 Other specified symptoms and signs involving the circulatory and respiratory systems: Secondary | ICD-10-CM

## 2014-08-20 DIAGNOSIS — R011 Cardiac murmur, unspecified: Secondary | ICD-10-CM | POA: Insufficient documentation

## 2014-08-20 NOTE — Progress Notes (Signed)
2D Echocardiogram Complete.  08/20/2014   Amarea [place], RDCS

## 2014-08-20 NOTE — Progress Notes (Signed)
Carotid Duplex Completed. °Brianna L Mazza,RVT °

## 2014-10-03 DIAGNOSIS — N183 Chronic kidney disease, stage 3 (moderate): Secondary | ICD-10-CM | POA: Diagnosis not present

## 2014-10-03 DIAGNOSIS — Z950 Presence of cardiac pacemaker: Secondary | ICD-10-CM | POA: Diagnosis not present

## 2014-10-03 DIAGNOSIS — N819 Female genital prolapse, unspecified: Secondary | ICD-10-CM | POA: Diagnosis not present

## 2014-10-03 DIAGNOSIS — G479 Sleep disorder, unspecified: Secondary | ICD-10-CM | POA: Diagnosis not present

## 2014-10-03 DIAGNOSIS — E785 Hyperlipidemia, unspecified: Secondary | ICD-10-CM | POA: Diagnosis not present

## 2014-10-03 DIAGNOSIS — I10 Essential (primary) hypertension: Secondary | ICD-10-CM | POA: Diagnosis not present

## 2014-10-03 DIAGNOSIS — E039 Hypothyroidism, unspecified: Secondary | ICD-10-CM | POA: Diagnosis not present

## 2014-10-03 DIAGNOSIS — M859 Disorder of bone density and structure, unspecified: Secondary | ICD-10-CM | POA: Diagnosis not present

## 2014-10-20 ENCOUNTER — Telehealth: Payer: Self-pay | Admitting: Cardiovascular Disease

## 2014-10-20 NOTE — Telephone Encounter (Signed)
LMTCB/SSS 

## 2014-10-20 NOTE — Telephone Encounter (Signed)
Pt called in stating that she received a new monitor for her pace maker and she is unsure on how to use it. She needs assistance, please call  Thanks

## 2014-10-24 NOTE — Telephone Encounter (Signed)
Spoke to patient on 3/29. Patient voiced confusion about Carelink upgrade. I informed her that it would be fine for her to wait until her next appt for explanation. I encouraged her to have her daughter-in-law call sooner if she had questions. Pt voiced understanding.

## 2014-11-03 DIAGNOSIS — N8111 Cystocele, midline: Secondary | ICD-10-CM | POA: Diagnosis not present

## 2014-11-04 ENCOUNTER — Ambulatory Visit (INDEPENDENT_AMBULATORY_CARE_PROVIDER_SITE_OTHER): Payer: Commercial Managed Care - HMO | Admitting: *Deleted

## 2014-11-04 DIAGNOSIS — I495 Sick sinus syndrome: Secondary | ICD-10-CM | POA: Diagnosis not present

## 2014-11-04 NOTE — Progress Notes (Signed)
Remote pacemaker transmission.   

## 2014-11-09 LAB — MDC_IDC_ENUM_SESS_TYPE_REMOTE
Brady Statistic AP VP Percent: 0.01 %
Brady Statistic AS VP Percent: 0 %
Brady Statistic RV Percent Paced: 0.01 %
Date Time Interrogation Session: 20160412125032
Lead Channel Impedance Value: 432 Ohm
Lead Channel Impedance Value: 536 Ohm
Lead Channel Sensing Intrinsic Amplitude: 2.13 mV
Lead Channel Setting Pacing Amplitude: 2 V
Lead Channel Setting Pacing Amplitude: 2.5 V
Lead Channel Setting Pacing Pulse Width: 0.4 ms
Lead Channel Setting Sensing Sensitivity: 0.9 mV
MDC IDC MSMT BATTERY VOLTAGE: 3 V
MDC IDC MSMT LEADCHNL RV SENSING INTR AMPL: 11.598 mV
MDC IDC STAT BRADY AP VS PERCENT: 64.86 %
MDC IDC STAT BRADY AS VS PERCENT: 35.13 %
MDC IDC STAT BRADY RA PERCENT PACED: 64.87 %
Zone Setting Detection Interval: 350 ms
Zone Setting Detection Interval: 400 ms

## 2014-11-19 ENCOUNTER — Encounter: Payer: Self-pay | Admitting: Cardiology

## 2014-11-21 ENCOUNTER — Encounter: Payer: Self-pay | Admitting: Cardiovascular Disease

## 2014-12-02 DIAGNOSIS — R102 Pelvic and perineal pain: Secondary | ICD-10-CM | POA: Diagnosis not present

## 2014-12-02 DIAGNOSIS — N993 Prolapse of vaginal vault after hysterectomy: Secondary | ICD-10-CM | POA: Diagnosis not present

## 2015-01-12 DIAGNOSIS — M25562 Pain in left knee: Secondary | ICD-10-CM | POA: Diagnosis not present

## 2015-02-04 ENCOUNTER — Ambulatory Visit (INDEPENDENT_AMBULATORY_CARE_PROVIDER_SITE_OTHER): Payer: Commercial Managed Care - HMO | Admitting: *Deleted

## 2015-02-04 DIAGNOSIS — I495 Sick sinus syndrome: Secondary | ICD-10-CM

## 2015-02-04 NOTE — Progress Notes (Signed)
Remote pacemaker transmission.   

## 2015-02-10 ENCOUNTER — Encounter: Payer: Self-pay | Admitting: Cardiovascular Disease

## 2015-02-10 LAB — CUP PACEART REMOTE DEVICE CHECK
Brady Statistic AP VP Percent: 0.01 %
Brady Statistic AP VS Percent: 70.42 %
Brady Statistic AS VP Percent: 0 %
Date Time Interrogation Session: 20160713123618
Lead Channel Sensing Intrinsic Amplitude: 10.916 mV
Lead Channel Setting Pacing Amplitude: 2.5 V
Lead Channel Setting Pacing Pulse Width: 0.4 ms
Lead Channel Setting Sensing Sensitivity: 0.9 mV
MDC IDC MSMT BATTERY VOLTAGE: 3 V
MDC IDC MSMT LEADCHNL RA IMPEDANCE VALUE: 600 Ohm
MDC IDC MSMT LEADCHNL RA SENSING INTR AMPL: 2.13 mV
MDC IDC MSMT LEADCHNL RV IMPEDANCE VALUE: 448 Ohm
MDC IDC SET LEADCHNL RA PACING AMPLITUDE: 2 V
MDC IDC SET ZONE DETECTION INTERVAL: 350 ms
MDC IDC STAT BRADY AS VS PERCENT: 29.56 %
MDC IDC STAT BRADY RA PERCENT PACED: 70.43 %
MDC IDC STAT BRADY RV PERCENT PACED: 0.01 %
Zone Setting Detection Interval: 400 ms

## 2015-03-04 ENCOUNTER — Encounter: Payer: Self-pay | Admitting: Cardiology

## 2015-03-09 ENCOUNTER — Encounter: Payer: Self-pay | Admitting: Cardiovascular Disease

## 2015-04-08 DIAGNOSIS — E039 Hypothyroidism, unspecified: Secondary | ICD-10-CM | POA: Diagnosis not present

## 2015-04-08 DIAGNOSIS — G479 Sleep disorder, unspecified: Secondary | ICD-10-CM | POA: Diagnosis not present

## 2015-04-08 DIAGNOSIS — Z23 Encounter for immunization: Secondary | ICD-10-CM | POA: Diagnosis not present

## 2015-04-08 DIAGNOSIS — N819 Female genital prolapse, unspecified: Secondary | ICD-10-CM | POA: Diagnosis not present

## 2015-04-08 DIAGNOSIS — Z950 Presence of cardiac pacemaker: Secondary | ICD-10-CM | POA: Diagnosis not present

## 2015-04-08 DIAGNOSIS — I10 Essential (primary) hypertension: Secondary | ICD-10-CM | POA: Diagnosis not present

## 2015-04-08 DIAGNOSIS — M859 Disorder of bone density and structure, unspecified: Secondary | ICD-10-CM | POA: Diagnosis not present

## 2015-04-27 DIAGNOSIS — N8111 Cystocele, midline: Secondary | ICD-10-CM | POA: Diagnosis not present

## 2015-05-07 ENCOUNTER — Ambulatory Visit (INDEPENDENT_AMBULATORY_CARE_PROVIDER_SITE_OTHER): Payer: Commercial Managed Care - HMO | Admitting: *Deleted

## 2015-05-07 DIAGNOSIS — I495 Sick sinus syndrome: Secondary | ICD-10-CM

## 2015-05-07 NOTE — Progress Notes (Signed)
Remote pacemaker transmission.   

## 2015-05-08 LAB — CUP PACEART REMOTE DEVICE CHECK
Battery Voltage: 3 V
Brady Statistic AP VS Percent: 62.78 %
Brady Statistic AS VP Percent: 0 %
Brady Statistic AS VS Percent: 37.19 %
Implantable Lead Implant Date: 20130729
Implantable Lead Implant Date: 20130729
Implantable Lead Location: 753860
Implantable Lead Model: 5086
Lead Channel Impedance Value: 440 Ohm
Lead Channel Sensing Intrinsic Amplitude: 2.261 mV
Lead Channel Setting Pacing Amplitude: 2 V
Lead Channel Setting Pacing Pulse Width: 0.4 ms
Lead Channel Setting Sensing Sensitivity: 0.9 mV
MDC IDC LEAD LOCATION: 753859
MDC IDC LEAD MODEL: 5086
MDC IDC MSMT LEADCHNL RA IMPEDANCE VALUE: 544 Ohm
MDC IDC MSMT LEADCHNL RV SENSING INTR AMPL: 11.598 mV
MDC IDC SESS DTM: 20161013134625
MDC IDC SET LEADCHNL RV PACING AMPLITUDE: 2.5 V
MDC IDC STAT BRADY AP VP PERCENT: 0.03 %
MDC IDC STAT BRADY RA PERCENT PACED: 62.81 %
MDC IDC STAT BRADY RV PERCENT PACED: 0.03 %
Zone Setting Detection Interval: 350 ms
Zone Setting Detection Interval: 400 ms

## 2015-05-12 ENCOUNTER — Encounter: Payer: Self-pay | Admitting: Cardiology

## 2015-06-22 DIAGNOSIS — Z961 Presence of intraocular lens: Secondary | ICD-10-CM | POA: Diagnosis not present

## 2015-06-22 DIAGNOSIS — H524 Presbyopia: Secondary | ICD-10-CM | POA: Diagnosis not present

## 2015-06-22 DIAGNOSIS — H04123 Dry eye syndrome of bilateral lacrimal glands: Secondary | ICD-10-CM | POA: Diagnosis not present

## 2015-07-27 DIAGNOSIS — J40 Bronchitis, not specified as acute or chronic: Secondary | ICD-10-CM | POA: Diagnosis not present

## 2015-08-18 ENCOUNTER — Encounter: Payer: Self-pay | Admitting: Cardiovascular Disease

## 2015-08-18 ENCOUNTER — Ambulatory Visit (INDEPENDENT_AMBULATORY_CARE_PROVIDER_SITE_OTHER): Payer: Commercial Managed Care - HMO | Admitting: Cardiovascular Disease

## 2015-08-18 ENCOUNTER — Ambulatory Visit
Admission: RE | Admit: 2015-08-18 | Discharge: 2015-08-18 | Disposition: A | Discharge status: Home / Self Care | Payer: Commercial Managed Care - HMO | Source: Ambulatory Visit | Attending: Cardiovascular Disease | Admitting: Cardiovascular Disease

## 2015-08-18 VITALS — BP 148/80 | HR 66 | Ht 63.5 in | Wt 126.8 lb

## 2015-08-18 DIAGNOSIS — I495 Sick sinus syndrome: Secondary | ICD-10-CM | POA: Diagnosis not present

## 2015-08-18 DIAGNOSIS — R05 Cough: Secondary | ICD-10-CM | POA: Diagnosis not present

## 2015-08-18 DIAGNOSIS — I10 Essential (primary) hypertension: Secondary | ICD-10-CM | POA: Diagnosis not present

## 2015-08-18 DIAGNOSIS — Z950 Presence of cardiac pacemaker: Secondary | ICD-10-CM

## 2015-08-18 DIAGNOSIS — I471 Supraventricular tachycardia: Secondary | ICD-10-CM

## 2015-08-18 DIAGNOSIS — J40 Bronchitis, not specified as acute or chronic: Secondary | ICD-10-CM

## 2015-08-18 NOTE — Progress Notes (Signed)
Patient ID: Katelyn Morris, female   DOB: 04/23/1923, 82 y.o.   MRN: [id number]    Cardiology Office Note    Date:  08/20/2015   ID:  Katelyn Morris, DOB 20-Jan-1923, MRN [id number]  PCP:  Melinda Crutch  Cardiologist:   Sanda Klein, MD   Chief Complaint  Patient presents with  . Pacemaker Check    History of Present Illness:  Katelyn Morris is a 82 y.o. female with history of sinus node dysfunction and pacemaker implantation (Medtronic Revo MRI conditional 2013) who returns for a device check. She has been struggling with an upper respiratory tract infection for about 3 weeks. Cough was initially dry, but this now productive. She denies fever or chills or pleuritic chest pain, but her abdominal muscles and her back hurt after coughing.  Last year's echo showed normal left ventricular systolic function and aortic valve sclerosis explaining her systolic murmur. Her right carotid bruit was not associated with any significant obstruction by ultrasonography. A remote nuclear stress test did not show perfusion abnormalities.  Pacemaker interrogation shows normal device function. Battery voltage is 3.00 V (RRT equaled 2.81 V). There is 65% atrial pacing and no ventricular pacing. A couple of episodes of atrial tachycardia with one-to-one conduction are recorded. Attempts to perform manual testing of the ventricular pacing threshold repeatedly led to induction of brief nonsustained atrial tachycardia, suggesting AV node-dependent mechanism.  Past Medical History  Diagnosis Date  . Hypertension   . Dysrhythmia     Bradycardia  . Heart murmur   . Pacemaker 02/20/2012    dual chamber  . Hypothyroidism   . Arthritis     in knees per patient  . SSS (sick sinus syndrome) ([place]) 08/06/2014    Past Surgical History  Procedure Laterality Date  . Insert / replace / remove pacemaker    . Abdominal hysterectomy    . Bladder tack    . Rotator cuff repair      bilateral  . Permanent pacemaker  insertion N/A 02/20/2012    Procedure: PERMANENT PACEMAKER INSERTION;  Surgeon: Sanda Klein, MD;  Location: [place] CATH [facility];  Service: Cardiovascular;  Laterality: N/A;    Outpatient Prescriptions Prior to Visit  Medication Sig Dispense Refill  . ALPRAZolam (XANAX) 0.5 MG tablet Take 0.25 mg by mouth at bedtime as needed.    Marland Kitchen aspirin 81 MG tablet Take 81 mg by mouth daily.    Marland Kitchen estropipate (OGEN 0.625) 0.75 MG tablet Take 0.75 mg by mouth every other day.     . levothyroxine (SYNTHROID, LEVOTHROID) 50 MCG tablet Take 50 mcg by mouth daily.    Marland Kitchen losartan-hydrochlorothiazide (HYZAAR) 50-12.5 MG per tablet Take 1 tablet by mouth daily.     No facility-administered medications prior to visit.     Allergies:   Sulfa antibiotics and Sulfonamide derivatives   Social History   Social History  . Marital Status: Widowed    Spouse Name: N/A  . Number of Children: N/A  . Years of Education: N/A   Social History Main Topics  . Smoking status: Never Smoker   . Smokeless tobacco: Never Used  . Alcohol Use: No  . Drug Use: No  . Sexual Activity: No   Other Topics Concern  . None   Social History Narrative      ROS:   Please see the history of present illness.    ROS All other systems reviewed and are negative.   PHYSICAL EXAM:   VS:  BP 148/80 mmHg  Pulse 66  Ht 5' 3.5" (1.613 m)  Wt 126 lb 12.8 oz (57.516 kg)  BMI 22.11 kg/m2   GEN: Well nourished, well developed, in no acute distress HEENT: normal Neck: no JVD, carotid bruits, or masses Cardiac: RRR; no murmurs, rubs, or gallops,no edema  Respiratory:  clear to auscultation bilaterally, normal work of breathing GI: soft, nontender, nondistended, + BS MS: no deformity or atrophy Skin: warm and dry, no rash Neuro:  Alert and Oriented x 3, Strength and sensation are intact Psych: euthymic mood, full affect  Wt Readings from Last 3 Encounters:  08/18/15 126 lb 12.8 oz (57.516 kg)  08/05/14 129 lb 1.6 oz (58.559 kg)    08/05/13 127 lb 12.8 oz (57.97 kg)      Studies/Labs Reviewed:   EKG:  EKG is ordered today.  The ekg ordered today demonstrates A paced, V sensed rhythm, diffuse minor T wave abnormality.  Lipid Panel    Component Value Date/Time   CHOL 208* 08/05/2014 0922   TRIG 202* 08/05/2014 0922   HDL 45 08/05/2014 0922   CHOLHDL 4.6 08/05/2014 0922   VLDL 40 08/05/2014 0922   LDLCALC 123* 08/05/2014 0922      ASSESSMENT:    1. Bronchitis   2. SSS (sick sinus syndrome) ([place])   3. Pacemaker - Medtronic Revo, dual chamber 2013   4. Essential hypertension   5. Paroxysmal SVT (supraventricular tachycardia) ([facility])      PLAN:  In order of problems listed above:  1. We'll check a chest x-ray to make sure she does not have a pneumonia, although I think the pain she describes is musculoskeletal due to coughing, rather than true pleurisy 2. Presented symptomatic sinus bradycardia and presyncope 3. Normal pacemaker function 4. Fair blood pressure control, blood pressure usually lower at home. She will keep an eye on her recordings to make sure the current elevation in systolic blood pressure is not persistent. 5. The episodes of SVT are not clinically relevant and are very infrequent by device check. No specific therapy recommended.   Medication Adjustments/Labs and Tests Ordered: Current medicines are reviewed at length with the patient today.  Concerns regarding medicines are outlined above.  Medication changes, Labs and Tests ordered today are listed in the Patient Instructions below. Patient Instructions  A chest x-ray takes a picture of the organs and structures inside the chest, including the heart, lungs, and blood vessels AT [facility] [place]. This test can show several things, including, whether the heart is enlarges; whether fluid is building up in the lungs; and whether pacemaker / defibrillator leads are still in place.     Remote monitoring is used  to monitor your Pacemaker or ICD from home. This monitoring reduces the number of office visits required to check your device to one time per year. It allows [place] to monitor the functioning of your device to ensure it is working properly. You are scheduled for a device check from home on November 18, 2015. You may send your transmission at any time that day. If you have a wireless device, the transmission will be sent automatically. After your physician reviews your transmission, you will receive a postcard with your next transmission date.  Dr. Sallyanne Kuster recommends that you schedule a follow-up appointment in: ONE YEAR    SignedSanda Klein, MD  08/20/2015 12:23 PM    [facility] [address], [place], [place]  [address] Phone: [phone]; Fax: [phone]

## 2015-08-18 NOTE — Patient Instructions (Addendum)
A chest x-ray takes a picture of the organs and structures inside the chest, including the heart, lungs, and blood vessels AT [facility] [place]. This test can show several things, including, whether the heart is enlarges; whether fluid is building up in the lungs; and whether pacemaker / defibrillator leads are still in place.     Remote monitoring is used to monitor your Pacemaker or ICD from home. This monitoring reduces the number of office visits required to check your device to one time per year. It allows [place] to monitor the functioning of your device to ensure it is working properly. You are scheduled for a device check from home on November 18, 2015. You may send your transmission at any time that day. If you have a wireless device, the transmission will be sent automatically. After your physician reviews your transmission, you will receive a postcard with your next transmission date.  Dr. Sallyanne Kuster recommends that you schedule a follow-up appointment in: [place]

## 2015-08-20 DIAGNOSIS — I471 Supraventricular tachycardia: Secondary | ICD-10-CM | POA: Insufficient documentation

## 2015-08-24 DIAGNOSIS — N8111 Cystocele, midline: Secondary | ICD-10-CM | POA: Diagnosis not present

## 2015-09-22 LAB — CUP PACEART INCLINIC DEVICE CHECK
Battery Voltage: 3 V
Brady Statistic AP VP Percent: 0.06 %
Brady Statistic AP VS Percent: 65.14 %
Brady Statistic AS VP Percent: 0 %
Brady Statistic AS VS Percent: 34.8 %
Brady Statistic RV Percent Paced: 0.06 %
Date Time Interrogation Session: 20170124173021
Implantable Lead Location: 753859
Implantable Lead Location: 753860
Implantable Lead Model: 5086
Lead Channel Impedance Value: 424 Ohm
Lead Channel Impedance Value: 544 Ohm
Lead Channel Sensing Intrinsic Amplitude: 2.087 mV
Lead Channel Setting Pacing Pulse Width: 0.4 ms
Lead Channel Setting Sensing Sensitivity: 0.9 mV
MDC IDC LEAD IMPLANT DT: 20130729
MDC IDC LEAD IMPLANT DT: 20130729
MDC IDC LEAD MODEL: 5086
MDC IDC MSMT LEADCHNL RV SENSING INTR AMPL: 9.892 mV
MDC IDC SET LEADCHNL RA PACING AMPLITUDE: 2 V
MDC IDC SET LEADCHNL RV PACING AMPLITUDE: 2.5 V
MDC IDC STAT BRADY RA PERCENT PACED: 65.2 %

## 2015-09-24 ENCOUNTER — Encounter: Payer: Self-pay | Admitting: Cardiovascular Disease

## 2015-10-06 DIAGNOSIS — Z950 Presence of cardiac pacemaker: Secondary | ICD-10-CM | POA: Diagnosis not present

## 2015-10-06 DIAGNOSIS — G479 Sleep disorder, unspecified: Secondary | ICD-10-CM | POA: Diagnosis not present

## 2015-10-06 DIAGNOSIS — I10 Essential (primary) hypertension: Secondary | ICD-10-CM | POA: Diagnosis not present

## 2015-10-06 DIAGNOSIS — E039 Hypothyroidism, unspecified: Secondary | ICD-10-CM | POA: Diagnosis not present

## 2015-11-18 ENCOUNTER — Ambulatory Visit (INDEPENDENT_AMBULATORY_CARE_PROVIDER_SITE_OTHER): Payer: Commercial Managed Care - HMO | Admitting: *Deleted

## 2015-11-18 DIAGNOSIS — I495 Sick sinus syndrome: Secondary | ICD-10-CM | POA: Diagnosis not present

## 2015-11-18 NOTE — Progress Notes (Signed)
Remote pacemaker transmission.   

## 2015-12-18 ENCOUNTER — Telehealth: Payer: Self-pay | Admitting: Cardiovascular Disease

## 2015-12-18 NOTE — Telephone Encounter (Signed)
New message  Pt wanted to speak w/ Dvice- sent remote transmission in April- wanted to know if she sent remote transmission. Please call back and discuss.

## 2015-12-18 NOTE — Telephone Encounter (Signed)
Informed pt that we did receive her remote transmission.

## 2015-12-28 DIAGNOSIS — N993 Prolapse of vaginal vault after hysterectomy: Secondary | ICD-10-CM | POA: Diagnosis not present

## 2015-12-30 ENCOUNTER — Encounter: Payer: Self-pay | Admitting: Cardiology

## 2015-12-30 LAB — CUP PACEART REMOTE DEVICE CHECK
Battery Voltage: 3 V
Brady Statistic AP VS Percent: 74.62 %
Brady Statistic RA Percent Paced: 74.7 %
Brady Statistic RV Percent Paced: 0.08 %
Implantable Lead Implant Date: 20130729
Implantable Lead Implant Date: 20130729
Implantable Lead Model: 5086
Lead Channel Impedance Value: 424 Ohm
Lead Channel Pacing Threshold Amplitude: 2.5 V
Lead Channel Pacing Threshold Pulse Width: 0.4 ms
Lead Channel Pacing Threshold Pulse Width: 0.4 ms
Lead Channel Setting Pacing Amplitude: 2 V
Lead Channel Setting Pacing Pulse Width: 0.4 ms
Lead Channel Setting Sensing Sensitivity: 0.9 mV
MDC IDC LEAD LOCATION: 753859
MDC IDC LEAD LOCATION: 753860
MDC IDC LEAD MODEL: 5086
MDC IDC MSMT LEADCHNL RA IMPEDANCE VALUE: 544 Ohm
MDC IDC MSMT LEADCHNL RA PACING THRESHOLD AMPLITUDE: 2 V
MDC IDC MSMT LEADCHNL RA SENSING INTR AMPL: 2.13 mV
MDC IDC MSMT LEADCHNL RV SENSING INTR AMPL: 9.551 mV
MDC IDC SESS DTM: 20170426132726
MDC IDC SET LEADCHNL RV PACING AMPLITUDE: 2.5 V
MDC IDC STAT BRADY AP VP PERCENT: 0.08 %
MDC IDC STAT BRADY AS VP PERCENT: 0 %
MDC IDC STAT BRADY AS VS PERCENT: 25.3 %

## 2016-02-17 ENCOUNTER — Ambulatory Visit (INDEPENDENT_AMBULATORY_CARE_PROVIDER_SITE_OTHER): Payer: Commercial Managed Care - HMO | Admitting: *Deleted

## 2016-02-17 DIAGNOSIS — I495 Sick sinus syndrome: Secondary | ICD-10-CM

## 2016-02-17 NOTE — Progress Notes (Signed)
Remote pacemaker transmission.   

## 2016-02-19 ENCOUNTER — Encounter: Payer: Self-pay | Admitting: Cardiology

## 2016-02-24 LAB — CUP PACEART REMOTE DEVICE CHECK
Brady Statistic AP VP Percent: 0.01 %
Brady Statistic AP VS Percent: 75.65 %
Brady Statistic AS VP Percent: 0 %
Brady Statistic RA Percent Paced: 75.66 %
Brady Statistic RV Percent Paced: 0.01 %
Date Time Interrogation Session: 20170726123556
Implantable Lead Implant Date: 20130729
Implantable Lead Location: 753859
Implantable Lead Model: 5086
Lead Channel Impedance Value: 576 Ohm
Lead Channel Setting Pacing Amplitude: 2 V
Lead Channel Setting Pacing Amplitude: 2.5 V
Lead Channel Setting Pacing Pulse Width: 0.4 ms
Lead Channel Setting Sensing Sensitivity: 0.9 mV
MDC IDC LEAD IMPLANT DT: 20130729
MDC IDC LEAD LOCATION: 753860
MDC IDC LEAD MODEL: 5086
MDC IDC MSMT BATTERY VOLTAGE: 3 V
MDC IDC MSMT LEADCHNL RA SENSING INTR AMPL: 2.391 mV
MDC IDC MSMT LEADCHNL RV IMPEDANCE VALUE: 472 Ohm
MDC IDC MSMT LEADCHNL RV SENSING INTR AMPL: 12.28 mV
MDC IDC STAT BRADY AS VS PERCENT: 24.34 %

## 2016-03-23 DIAGNOSIS — R21 Rash and other nonspecific skin eruption: Secondary | ICD-10-CM | POA: Diagnosis not present

## 2016-03-23 DIAGNOSIS — N951 Menopausal and female climacteric states: Secondary | ICD-10-CM | POA: Diagnosis not present

## 2016-04-11 DIAGNOSIS — I10 Essential (primary) hypertension: Secondary | ICD-10-CM | POA: Diagnosis not present

## 2016-04-11 DIAGNOSIS — Z23 Encounter for immunization: Secondary | ICD-10-CM | POA: Diagnosis not present

## 2016-04-11 DIAGNOSIS — E039 Hypothyroidism, unspecified: Secondary | ICD-10-CM | POA: Diagnosis not present

## 2016-04-25 DIAGNOSIS — N812 Incomplete uterovaginal prolapse: Secondary | ICD-10-CM | POA: Diagnosis not present

## 2016-05-18 ENCOUNTER — Ambulatory Visit (INDEPENDENT_AMBULATORY_CARE_PROVIDER_SITE_OTHER): Payer: Commercial Managed Care - HMO | Admitting: *Deleted

## 2016-05-18 DIAGNOSIS — I495 Sick sinus syndrome: Secondary | ICD-10-CM

## 2016-05-18 NOTE — Progress Notes (Signed)
Remote pacemaker transmission.   

## 2016-05-19 ENCOUNTER — Encounter: Payer: Self-pay | Admitting: Cardiology

## 2016-06-15 LAB — CUP PACEART REMOTE DEVICE CHECK
Battery Voltage: 3 V
Brady Statistic AP VP Percent: 0.01 %
Brady Statistic AS VP Percent: 0 %
Brady Statistic RV Percent Paced: 0.01 %
Implantable Lead Implant Date: 20130729
Implantable Lead Location: 753859
Implantable Lead Location: 753860
Implantable Lead Model: 5086
Implantable Lead Model: 5086
Implantable Pulse Generator Implant Date: 20130729
Lead Channel Sensing Intrinsic Amplitude: 2.261 mV
Lead Channel Setting Pacing Amplitude: 2 V
Lead Channel Setting Pacing Amplitude: 2.5 V
Lead Channel Setting Pacing Pulse Width: 0.4 ms
MDC IDC LEAD IMPLANT DT: 20130729
MDC IDC MSMT LEADCHNL RA IMPEDANCE VALUE: 680 Ohm
MDC IDC MSMT LEADCHNL RV IMPEDANCE VALUE: 488 Ohm
MDC IDC MSMT LEADCHNL RV SENSING INTR AMPL: 13.986 mV
MDC IDC SESS DTM: 20171025131132
MDC IDC SET LEADCHNL RV SENSING SENSITIVITY: 0.9 mV
MDC IDC STAT BRADY AP VS PERCENT: 69.64 %
MDC IDC STAT BRADY AS VS PERCENT: 30.35 %
MDC IDC STAT BRADY RA PERCENT PACED: 69.65 %

## 2016-06-30 DIAGNOSIS — R0789 Other chest pain: Secondary | ICD-10-CM | POA: Diagnosis not present

## 2016-08-17 ENCOUNTER — Ambulatory Visit (INDEPENDENT_AMBULATORY_CARE_PROVIDER_SITE_OTHER): Payer: Medicare HMO | Admitting: Cardiovascular Disease

## 2016-08-17 ENCOUNTER — Encounter: Payer: Self-pay | Admitting: Cardiovascular Disease

## 2016-08-17 VITALS — BP 141/68 | HR 64 | Ht 63.5 in | Wt 128.0 lb

## 2016-08-17 DIAGNOSIS — I495 Sick sinus syndrome: Secondary | ICD-10-CM | POA: Diagnosis not present

## 2016-08-17 DIAGNOSIS — I471 Supraventricular tachycardia: Secondary | ICD-10-CM | POA: Diagnosis not present

## 2016-08-17 DIAGNOSIS — I10 Essential (primary) hypertension: Secondary | ICD-10-CM | POA: Diagnosis not present

## 2016-08-17 DIAGNOSIS — Z950 Presence of cardiac pacemaker: Secondary | ICD-10-CM

## 2016-08-17 LAB — CUP PACEART INCLINIC DEVICE CHECK
Brady Statistic AP VS Percent: 70.02 %
Brady Statistic AS VP Percent: 0 %
Brady Statistic AS VS Percent: 29.95 %
Date Time Interrogation Session: 20180124143128
Implantable Lead Implant Date: 20130729
Implantable Lead Location: 753860
Lead Channel Impedance Value: 440 Ohm
Lead Channel Impedance Value: 504 Ohm
Lead Channel Pacing Threshold Amplitude: 1 V
Lead Channel Pacing Threshold Pulse Width: 0.4 ms
Lead Channel Sensing Intrinsic Amplitude: 10.916 mV
Lead Channel Setting Pacing Amplitude: 2 V
Lead Channel Setting Sensing Sensitivity: 0.9 mV
MDC IDC LEAD IMPLANT DT: 20130729
MDC IDC LEAD LOCATION: 753859
MDC IDC MSMT BATTERY VOLTAGE: 3 V
MDC IDC MSMT LEADCHNL RA PACING THRESHOLD AMPLITUDE: 0.5 V
MDC IDC MSMT LEADCHNL RA PACING THRESHOLD PULSEWIDTH: 0.4 ms
MDC IDC MSMT LEADCHNL RA SENSING INTR AMPL: 2.521 mV
MDC IDC PG IMPLANT DT: 20130729
MDC IDC SET LEADCHNL RV PACING AMPLITUDE: 2.5 V
MDC IDC SET LEADCHNL RV PACING PULSEWIDTH: 0.4 ms
MDC IDC STAT BRADY AP VP PERCENT: 0.03 %
MDC IDC STAT BRADY RA PERCENT PACED: 69.98 %
MDC IDC STAT BRADY RV PERCENT PACED: 0.03 %

## 2016-08-17 NOTE — Progress Notes (Signed)
Patient ID: Katelyn Morris, female   DOB: 1922/12/16, 84 y.o.   MRN: [id number]    Cardiology Office Note    Date:  08/17/2016   ID:  Katelyn Morris, DOB [birth year], MRN [id number]  PCP:  Melinda Crutch (Inactive)  Cardiologist:   Sanda Klein, MD   Chief Complaint  Patient presents with  . Follow-up    History of Present Illness:  Katelyn Morris is a 83 y.o. female with history of sinus node dysfunction and pacemaker implantation (Medtronic Revo MRI conditional 2013) who returns for a device check.   She has done very well from a cardiac standpoint and denies dyspnea, angina, syncope, palpitations, focal neurological deficits, edema or claudication. She slipped while reaching into the trunk of her car and had a left-sided rib fracture, but she remains physically very active. She does complain of some fatigue and heart rate histograms are rather blunted at current sensor settings.  She has normal left ventricular systolic function and aortic valve sclerosis explaining her systolic murmur. Her right carotid bruit was not associated with any significant obstruction by ultrasonography. A remote nuclear stress test did not show perfusion abnormalities.  Pacemaker interrogation shows normal device function. Battery voltage is unchanged from last year at 3.00 V (RRT equaled 2.81 V). There is 70% atrial pacing and no ventricular pacing. 5 episodes of atrial tachycardia with one-to-one conduction are recorded. Past attempts to perform manual testing of the ventricular pacing threshold repeatedly led to induction of brief nonsustained atrial tachycardia, suggesting AV node-dependent mechanism. However, she does not have any symptomatic episodes of SVT.  Past Medical History:  Diagnosis Date  . Arthritis    in knees per patient  . Dysrhythmia    Bradycardia  . Heart murmur   . Hypertension   . Hypothyroidism   . Pacemaker 02/20/2012   dual chamber  . SSS (sick sinus syndrome) ([place])  08/06/2014    Past Surgical History:  Procedure Laterality Date  . ABDOMINAL HYSTERECTOMY    . bladder tack    . INSERT / REPLACE / REMOVE PACEMAKER    . PERMANENT PACEMAKER INSERTION N/A 02/20/2012   Procedure: PERMANENT PACEMAKER INSERTION;  Surgeon: Sanda Klein, MD;  Location: [facility];  Service: Cardiovascular;  Laterality: N/A;  . ROTATOR CUFF REPAIR     bilateral    Outpatient Medications Prior to Visit  Medication Sig Dispense Refill  . ALPRAZolam (XANAX) 0.5 MG tablet Take 0.25 mg by mouth at bedtime as needed.    Marland Kitchen aspirin 81 MG tablet Take 81 mg by mouth daily.    Marland Kitchen levothyroxine (SYNTHROID, LEVOTHROID) 50 MCG tablet Take 50 mcg by mouth daily.    Marland Kitchen losartan-hydrochlorothiazide (HYZAAR) 50-12.5 MG per tablet Take 1 tablet by mouth daily.    . Multiple Minerals-Vitamins (CALCIUM & VIT D3 BONE HEALTH PO) Take 2,000 mg by mouth daily. Take 3 tablets (6000mg ) daily    . estropipate (OGEN 0.625) 0.75 MG tablet Take 0.75 mg by mouth every other day.      No facility-administered medications prior to visit.      Allergies:   Sulfa antibiotics and Sulfonamide derivatives   Social History   Social History  . Marital status: Widowed    Spouse name: N/A  . Number of children: N/A  . Years of education: N/A   Social History Main Topics  . Smoking status: Never Smoker  . Smokeless tobacco: Never Used  . Alcohol use No  . Drug use: No  .  Sexual activity: No   Other Topics Concern  . Not on file   Social History Narrative  . No narrative on file      ROS:   Please see the history of present illness.    ROS All other systems reviewed and are negative.   PHYSICAL EXAM:   VS:  BP (!) 160/70   Pulse 64   Ht 5' 3.5" (1.613 m)   Wt 58.1 kg (128 lb)   BMI 22.32 kg/m    GEN: Well nourished, well developed, in no acute distress  HEENT: normal  Neck: no JVD, carotid bruits, or masses Cardiac: RRR; no murmurs, rubs, or gallops,no edema , healthy subclavian  pacemaker site Respiratory:  clear to auscultation bilaterally, normal work of breathing GI: soft, nontender, nondistended, + BS MS: no deformity or atrophy  Skin: warm and dry, no rash Neuro:  Alert and Oriented x 3, Strength and sensation are intact Psych: euthymic mood, full affect  Wt Readings from Last 3 Encounters:  08/17/16 58.1 kg (128 lb)  08/18/15 57.5 kg (126 lb 12.8 oz)  08/05/14 58.6 kg (129 lb 1.6 oz)      Studies/Labs Reviewed:   EKG:  EKG is ordered today.  The ekg ordered today demonstrates NSR, diffuse minor T wave abnormality. QTc 400 ms  Lipid Panel    Component Value Date/Time   CHOL 208 (H) 08/05/2014 0922   TRIG 202 (H) 08/05/2014 0922   HDL 45 08/05/2014 0922   CHOLHDL 4.6 08/05/2014 0922   VLDL 40 08/05/2014 0922   LDLCALC 123 (H) 08/05/2014 [id number]      ASSESSMENT:    1. SSS (sick sinus syndrome) ([place])   2. Pacemaker - Medtronic Revo, dual chamber 2013   3. Essential hypertension   4. Paroxysmal SVT (supraventricular tachycardia) ([facility])      PLAN:  In order of problems listed above:  1. SSS: Presented with symptomatic sinus bradycardia and presyncope. Currently does not have clear-cut symptoms of bradycardia, but probably has some degree of chronotropic incompetence. 2. PM: Normal pacemaker function. Sensor settings made more aggressive to see if this will help with exertional fatigue 3. HTN: Fair blood pressure control, blood pressure usually lower at home, with systolics usually in the [id number].  4. SVT: The episodes of SVT are not clinically relevant and are very infrequent by device check. No specific therapy recommended. She may have AV node reentry, based on behavior during threshold testing.   Medication Adjustments/Labs and Tests Ordered: Current medicines are reviewed at length with the patient today.  Concerns regarding medicines are outlined above.  Medication changes, Labs and Tests ordered today are listed in the Patient  Instructions below. Patient Instructions  Dr Sallyanne Kuster recommends that you continue on your current medications as directed. Please refer to the Current Medication list given to you today.  Remote monitoring is used to monitor your Pacemaker of ICD from home. This monitoring reduces the number of office visits required to check your device to one time per year. It allows [place] to keep an eye on the functioning of your device to ensure it is working properly. You are scheduled for a device check from home on Wednesday, April 25th, 2018. You may send your transmission at any time that day. If you have a wireless device, the transmission will be sent automatically. After your physician reviews your transmission, you will receive a postcard with your next transmission date.  Dr Sallyanne Kuster recommends that you schedule a follow-up appointment in  12 months with a pacemaker check. You will receive a reminder letter in the mail two months in advance. If you don't receive a letter, please call our office to schedule the follow-up appointment.  If you need a refill on your cardiac medications before your next appointment, please call your pharmacy. Signed, Sanda Klein, MD  08/17/2016 2:17 PM    [facility] [address], [place], [place]  [address] Phone: [phone]; Fax: [phone]

## 2016-08-17 NOTE — Patient Instructions (Signed)
Dr Sallyanne Kuster recommends that you continue on your current medications as directed. Please refer to the Current Medication list given to you today.  Remote monitoring is used to monitor your Pacemaker of ICD from home. This monitoring reduces the number of office visits required to check your device to one time per year. It allows [place] to keep an eye on the functioning of your device to ensure it is working properly. You are scheduled for a device check from home on Wednesday, April 25th, 2018. You may send your transmission at any time that day. If you have a wireless device, the transmission will be sent automatically. After your physician reviews your transmission, you will receive a postcard with your next transmission date.  Dr Sallyanne Kuster recommends that you schedule a follow-up appointment in 12 months with a pacemaker check. You will receive a reminder letter in the mail two months in advance. If you don't receive a letter, please call our office to schedule the follow-up appointment.  If you need a refill on your cardiac medications before your next appointment, please call your pharmacy.

## 2016-08-29 DIAGNOSIS — N8111 Cystocele, midline: Secondary | ICD-10-CM | POA: Diagnosis not present

## 2016-10-11 DIAGNOSIS — N183 Chronic kidney disease, stage 3 (moderate): Secondary | ICD-10-CM | POA: Diagnosis not present

## 2016-10-11 DIAGNOSIS — G479 Sleep disorder, unspecified: Secondary | ICD-10-CM | POA: Diagnosis not present

## 2016-10-11 DIAGNOSIS — I10 Essential (primary) hypertension: Secondary | ICD-10-CM | POA: Diagnosis not present

## 2016-10-11 DIAGNOSIS — E039 Hypothyroidism, unspecified: Secondary | ICD-10-CM | POA: Diagnosis not present

## 2016-10-11 DIAGNOSIS — Z950 Presence of cardiac pacemaker: Secondary | ICD-10-CM | POA: Diagnosis not present

## 2016-10-17 DIAGNOSIS — N8111 Cystocele, midline: Secondary | ICD-10-CM | POA: Diagnosis not present

## 2016-11-16 ENCOUNTER — Ambulatory Visit (INDEPENDENT_AMBULATORY_CARE_PROVIDER_SITE_OTHER): Payer: Medicare HMO | Admitting: *Deleted

## 2016-11-16 DIAGNOSIS — I495 Sick sinus syndrome: Secondary | ICD-10-CM | POA: Diagnosis not present

## 2016-11-16 NOTE — Progress Notes (Signed)
Remote pacemaker transmission.   

## 2016-11-17 LAB — CUP PACEART REMOTE DEVICE CHECK
Battery Voltage: 2.99 V
Brady Statistic AP VS Percent: 62.3 %
Brady Statistic AS VP Percent: 0 %
Brady Statistic RA Percent Paced: 62.27 %
Brady Statistic RV Percent Paced: 0 %
Implantable Lead Implant Date: 20130729
Implantable Lead Location: 753860
Implantable Pulse Generator Implant Date: 20130729
Lead Channel Impedance Value: 464 Ohm
Lead Channel Sensing Intrinsic Amplitude: 2.391 mV
Lead Channel Setting Pacing Pulse Width: 0.4 ms
Lead Channel Setting Sensing Sensitivity: 0.9 mV
MDC IDC LEAD IMPLANT DT: 20130729
MDC IDC LEAD LOCATION: 753859
MDC IDC MSMT LEADCHNL RA IMPEDANCE VALUE: 552 Ohm
MDC IDC MSMT LEADCHNL RV SENSING INTR AMPL: 11.257 mV
MDC IDC SESS DTM: 20180425125318
MDC IDC SET LEADCHNL RA PACING AMPLITUDE: 2 V
MDC IDC SET LEADCHNL RV PACING AMPLITUDE: 2.5 V
MDC IDC STAT BRADY AP VP PERCENT: 0 %
MDC IDC STAT BRADY AS VS PERCENT: 37.7 %

## 2016-11-18 ENCOUNTER — Encounter: Payer: Self-pay | Admitting: Cardiology

## 2016-11-21 DIAGNOSIS — N816 Rectocele: Secondary | ICD-10-CM | POA: Diagnosis not present

## 2016-11-21 DIAGNOSIS — N993 Prolapse of vaginal vault after hysterectomy: Secondary | ICD-10-CM | POA: Diagnosis not present

## 2017-02-15 ENCOUNTER — Ambulatory Visit (INDEPENDENT_AMBULATORY_CARE_PROVIDER_SITE_OTHER): Payer: Medicare HMO | Admitting: *Deleted

## 2017-02-15 DIAGNOSIS — I495 Sick sinus syndrome: Secondary | ICD-10-CM

## 2017-02-15 NOTE — Progress Notes (Signed)
Remote pacemaker transmission.   

## 2017-02-16 ENCOUNTER — Encounter: Payer: Self-pay | Admitting: Cardiology

## 2017-02-20 DIAGNOSIS — N898 Other specified noninflammatory disorders of vagina: Secondary | ICD-10-CM | POA: Diagnosis not present

## 2017-02-20 DIAGNOSIS — N8111 Cystocele, midline: Secondary | ICD-10-CM | POA: Diagnosis not present

## 2017-03-20 LAB — CUP PACEART REMOTE DEVICE CHECK
Brady Statistic AP VP Percent: 0.02 %
Brady Statistic AS VP Percent: 0 %
Brady Statistic RA Percent Paced: 69.13 %
Brady Statistic RV Percent Paced: 0.02 %
Implantable Lead Implant Date: 20130729
Implantable Lead Implant Date: 20130729
Implantable Lead Location: 753859
Implantable Lead Location: 753860
Implantable Pulse Generator Implant Date: 20130729
Lead Channel Impedance Value: 424 Ohm
Lead Channel Impedance Value: 480 Ohm
Lead Channel Setting Pacing Amplitude: 2 V
Lead Channel Setting Pacing Amplitude: 2.5 V
Lead Channel Setting Pacing Pulse Width: 0.4 ms
MDC IDC MSMT BATTERY VOLTAGE: 2.99 V
MDC IDC MSMT LEADCHNL RA SENSING INTR AMPL: 1.869 mV
MDC IDC MSMT LEADCHNL RV SENSING INTR AMPL: 9.892 mV
MDC IDC SESS DTM: 20180725130428
MDC IDC SET LEADCHNL RV SENSING SENSITIVITY: 0.9 mV
MDC IDC STAT BRADY AP VS PERCENT: 69.19 %
MDC IDC STAT BRADY AS VS PERCENT: 30.79 %

## 2017-04-14 DIAGNOSIS — N183 Chronic kidney disease, stage 3 (moderate): Secondary | ICD-10-CM | POA: Diagnosis not present

## 2017-04-14 DIAGNOSIS — E039 Hypothyroidism, unspecified: Secondary | ICD-10-CM | POA: Diagnosis not present

## 2017-04-14 DIAGNOSIS — Z23 Encounter for immunization: Secondary | ICD-10-CM | POA: Diagnosis not present

## 2017-04-14 DIAGNOSIS — G479 Sleep disorder, unspecified: Secondary | ICD-10-CM | POA: Diagnosis not present

## 2017-04-14 DIAGNOSIS — I10 Essential (primary) hypertension: Secondary | ICD-10-CM | POA: Diagnosis not present

## 2017-04-14 DIAGNOSIS — Z950 Presence of cardiac pacemaker: Secondary | ICD-10-CM | POA: Diagnosis not present

## 2017-05-17 ENCOUNTER — Encounter: Payer: Medicare HMO | Admitting: *Deleted

## 2017-05-24 DIAGNOSIS — L570 Actinic keratosis: Secondary | ICD-10-CM | POA: Diagnosis not present

## 2017-05-24 DIAGNOSIS — L821 Other seborrheic keratosis: Secondary | ICD-10-CM | POA: Diagnosis not present

## 2017-06-19 DIAGNOSIS — N812 Incomplete uterovaginal prolapse: Secondary | ICD-10-CM | POA: Diagnosis not present

## 2017-08-16 ENCOUNTER — Encounter: Payer: Self-pay | Admitting: Cardiovascular Disease

## 2017-08-16 ENCOUNTER — Ambulatory Visit: Payer: Medicare HMO | Admitting: Cardiovascular Disease

## 2017-08-16 VITALS — BP 159/72 | HR 65 | Ht 63.5 in | Wt 124.6 lb

## 2017-08-16 DIAGNOSIS — I471 Supraventricular tachycardia: Secondary | ICD-10-CM | POA: Diagnosis not present

## 2017-08-16 DIAGNOSIS — I10 Essential (primary) hypertension: Secondary | ICD-10-CM

## 2017-08-16 DIAGNOSIS — I495 Sick sinus syndrome: Secondary | ICD-10-CM

## 2017-08-16 DIAGNOSIS — Z950 Presence of cardiac pacemaker: Secondary | ICD-10-CM

## 2017-08-16 NOTE — Progress Notes (Signed)
Patient ID: Katelyn Morris, female   DOB: [birth year], 84 y.o.   MRN: [id number]    Cardiology Office Note    Date:  08/16/2017   ID:  Katelyn, Morris April 17, 1923, MRN [id number]  PCP:  Melinda Crutch (Inactive)  Cardiologist:   Sanda Klein, MD   Chief Complaint  Patient presents with  . Follow-up    History of Present Illness:  Katelyn Morris is a 84 y.o. female with history of sinus node dysfunction and pacemaker implantation (Medtronic Revo MRI conditional 2013) who returns for a device check.   Continues to do remarkably well for her age, living independently, working in her garden.  She has noticed occasional episodes of palpitations associated with mild dizziness.  The timing of these events correlated well with a longer than average episode of supraventricular tachycardia lasting for total about 8 minutes.  She has had frequent episodes of supraventricular tachycardia recorded by her pacemaker over the last several years.  These are usually less than a minute in duration and usually asymptomatic.  The longest ever recorded was only 12 minutes.  There has never been evidence of atrial fibrillation.  Although the onset of these events is not well recorded, they have a short RP interval and probably represent AV node reentry.  The atrial and ventricular electrograms are not temporarily superimposed.  The patient specifically denies any chest pain at rest exertion, dyspnea at rest or with exertion, orthopnea, paroxysmal nocturnal dyspnea, syncope, focal neurological deficits, intermittent claudication, lower extremity edema, unexplained weight gain, cough, hemoptysis or wheezing.  She has normal left ventricular systolic function and aortic valve sclerosis explaining her systolic murmur. Her right carotid bruit was not associated with any significant obstruction by ultrasonography. A remote nuclear stress test did not show perfusion abnormalities.  Pacemaker interrogation shows  normal device function.  Her Medtronic revo device was implanted in 2013, battery voltage is almost unchanged from last year at 2.99V from 3.00 V (RRT=2.81 V). There is 66 % atrial pacing and no ventricular pacing.  A total of 85 episodes of atrial tachycardia with 1: 1 AV conduction have been recorded in roughly 12 months.. Past attempts to perform manual testing of the ventricular pacing threshold repeatedly led to induction of brief nonsustained atrial tachycardia, suggesting AV node-dependent mechanism.   Past Medical History:  Diagnosis Date  . Arthritis    in knees per patient  . Dysrhythmia    Bradycardia  . Heart murmur   . Hypertension   . Hypothyroidism   . Pacemaker 02/20/2012   dual chamber  . SSS (sick sinus syndrome) ([place]) 08/06/2014    Past Surgical History:  Procedure Laterality Date  . ABDOMINAL HYSTERECTOMY    . bladder tack    . INSERT / REPLACE / REMOVE PACEMAKER    . PERMANENT PACEMAKER INSERTION N/A 02/20/2012   Procedure: PERMANENT PACEMAKER INSERTION;  Surgeon: Sanda Klein, MD;  Location: [facility]ATH [facility];  Service: Cardiovascular;  Laterality: N/A;  . ROTATOR CUFF REPAIR     bilateral    Outpatient Medications Prior to Visit  Medication Sig Dispense Refill  . ALPRAZolam (XANAX) 0.5 MG tablet Take 0.25 mg by mouth at bedtime as needed.    Marland Kitchen aspirin 81 MG tablet Take 81 mg by mouth daily.    Marland Kitchen levothyroxine (SYNTHROID, LEVOTHROID) 50 MCG tablet Take 50 mcg by mouth daily.    Marland Kitchen losartan-hydrochlorothiazide (HYZAAR) 50-12.5 MG per tablet Take 1 tablet by mouth daily.    . Multiple  Minerals-Vitamins (CALCIUM & VIT D3 BONE HEALTH PO) Take 2,000 mg by mouth daily. Take 3 tablets (6000mg ) daily     No facility-administered medications prior to visit.      Allergies:   Sulfa antibiotics and Sulfonamide derivatives   Social History   Socioeconomic History  . Marital status: Widowed    Spouse name: None  . Number of children: None  . Years of education: None  .  Highest education level: None  Social Needs  . Financial resource strain: None  . Food insecurity - worry: None  . Food insecurity - inability: None  . Transportation needs - medical: None  . Transportation needs - non-medical: None  Occupational History  . None  Tobacco Use  . Smoking status: Never Smoker  . Smokeless tobacco: Never Used  Substance and Sexual Activity  . Alcohol use: No  . Drug use: No  . Sexual activity: No    Birth control/protection: Post-menopausal  Other Topics Concern  . None  Social History Narrative  . None      ROS:   Please see the history of present illness.    ROS All other systems reviewed and are negative.   PHYSICAL EXAM:   VS:  BP (!) 159/72   Pulse 65   Ht 5' 3.5" (1.613 m)   Wt 124 lb 9.6 oz (56.5 kg)   BMI 21.73 kg/m    Recheck BP 136/61 mmHg General: Alert, oriented x3, no distress, appears lean and remarkably fit for her age Head: no evidence of trauma, PERRL, EOMI, no exophtalmos or lid lag, no myxedema, no xanthelasma; normal ears, nose and oropharynx Neck: normal jugular venous pulsations and no hepatojugular reflux; brisk carotid pulses without delay and no carotid bruits Chest: clear to auscultation, no signs of consolidation by percussion or palpation, normal fremitus, symmetrical and full respiratory excursions Cardiovascular: normal position and quality of the apical impulse, regular rhythm, normal first and second heart sounds, no murmurs, rubs or gallops,  healthy subclavian pacemaker site Abdomen: no tenderness or distention, no masses by palpation, no abnormal pulsatility or arterial bruits, normal bowel sounds, no hepatosplenomegaly Extremities: no clubbing, cyanosis or edema; 2+ radial, ulnar and brachial pulses bilaterally; 2+ right femoral, posterior tibial and dorsalis pedis pulses; 2+ left femoral, posterior tibial and dorsalis pedis pulses; no subclavian or femoral bruits Neurological: grossly nonfocal Psych:  Normal mood and affect   Wt Readings from Last 3 Encounters:  08/16/17 124 lb 9.6 oz (56.5 kg)  08/17/16 128 lb (58.1 kg)  08/18/15 126 lb 12.8 oz (57.5 kg)      Studies/Labs Reviewed:   EKG:  EKG is ordered today.  The ekg ordered today demonstra normal sinus rhythmtes atrial paced, ventricular sensed rhythm, QS pattern in leads V1-V2, T wave inversion V4-V6, QTC 393 ms Lipid Panel    Component Value Date/Time   CHOL 208 (H) 08/05/2014 0922   TRIG 202 (H) 08/05/2014 0922   HDL 45 08/05/2014 0922   CHOLHDL 4.6 08/05/2014 0922   VLDL 40 08/05/2014 0922   LDLCALC 123 (H) 08/05/2014 8101      ASSESSMENT:    1. Paroxysmal SVT (supraventricular tachycardia) ([place])   2. SSS (sick sinus syndrome) ([place])   3. Pacemaker - Medtronic Revo, dual chamber 2013   4. Essential hypertension      PLAN:  In order of problems listed above:  1. SVT: The episodes have markedly increased in frequency, although they mostly remain very brief.  For the first  time she had some symptoms related to these.  Since we had previously induced the arrhythmia with ventricular threshold testing, it is likely that she has AV node reentry, although the atrial and ventricular histograms are not directly superimposed.  Discussed terminating the arrhythmia with a Valsalva maneuver or the diving reflex.  We will check thyroid function and electrolytes.  If there are no pathological abnormalities on the labs and her symptoms continue to be bothersome, we will replace her losartan with a beta-blocker or centrally acting calcium channel blocker. 2. SSS: Presented with symptomatic sinus bradycardia and presyncope.  She is quite active and there is no evidence of chronotropic incompetence judging by her symptoms or her heart rate histogram distribution. 3. PM: Normal pacemaker function.  Sensor settings at last office appointment seem to have helped with heart rate response to activity. 4. HTN: Mildly elevated blood pressure  when she first presented, quickly improving after rest.    Medication Adjustments/Labs and Tests Ordered: Current medicines are reviewed at length with the patient today.  Concerns regarding medicines are outlined above.  Medication changes, Labs and Tests ordered today are listed in the Patient Instructions below. Patient Instructions  Dr Sallyanne Kuster recommends that you continue on your current medications as directed. Please refer to the Current Medication list given to you today.  Your physician recommends that you return for lab work in [place].  Remote monitoring is used to monitor your Pacemaker or ICD from home. This monitoring reduces the number of office visits required to check your device to one time per year. It allows [place] to keep an eye on the functioning of your device to ensure it is working properly. You are scheduled for a device check from home on Wednesday, April 24th, 2019. You may send your transmission at any time that day. If you have a wireless device, the transmission will be sent automatically. After your physician reviews your transmission, you will receive a notification with your next transmission date.  Dr Sallyanne Kuster recommends that you schedule a follow-up appointment in 12 months with a pacemaker check. You will receive a reminder letter in the mail two months in advance. If you don't receive a letter, please call our office to schedule the follow-up appointment.  If you need a refill on your cardiac medications before your next appointment, please call your pharmacy.  Signed, Sanda Klein, MD  08/16/2017 2:18 PM    [facility] [address], [place], [place]  [address] Phone: [phone]; Fax: [phone]

## 2017-08-16 NOTE — Patient Instructions (Addendum)
Dr Sallyanne Kuster recommends that you continue on your current medications as directed. Please refer to the Current Medication list given to you today.  Your physician recommends that you return for lab work in [place].  Remote monitoring is used to monitor your Pacemaker or ICD from home. This monitoring reduces the number of office visits required to check your device to one time per year. It allows [place] to keep an eye on the functioning of your device to ensure it is working properly. You are scheduled for a device check from home on Wednesday, April 24th, 2019. You may send your transmission at any time that day. If you have a wireless device, the transmission will be sent automatically. After your physician reviews your transmission, you will receive a notification with your next transmission date.  Dr Sallyanne Kuster recommends that you schedule a follow-up appointment in 12 months with a pacemaker check. You will receive a reminder letter in the mail two months in advance. If you don't receive a letter, please call our office to schedule the follow-up appointment.  If you need a refill on your cardiac medications before your next appointment, please call your pharmacy.

## 2017-08-17 LAB — MAGNESIUM: MAGNESIUM: 2.1 mg/dL (ref 1.6–2.3)

## 2017-08-17 LAB — BASIC METABOLIC PANEL
BUN/Creatinine Ratio: 23 (ref 12–28)
BUN: 22 mg/dL (ref 10–36)
CO2: 26 mmol/L (ref 20–29)
CREATININE: 0.97 mg/dL (ref 0.57–1.00)
Calcium: 9.4 mg/dL (ref 8.7–10.3)
Chloride: 101 mmol/L (ref 96–106)
GFR calc Af Amer: 59 mL/min/{1.73_m2} — ABNORMAL LOW (ref >59)
GFR, EST NON AFRICAN AMERICAN: 51 mL/min/{1.73_m2} — AB (ref >59)
GLUCOSE: 93 mg/dL (ref 65–99)
Potassium: 4.4 mmol/L (ref 3.5–5.2)
Sodium: 140 mmol/L (ref 134–144)

## 2017-08-17 LAB — TSH: TSH: 2.43 u[IU]/mL (ref 0.450–4.500)

## 2017-09-13 LAB — CUP PACEART INCLINIC DEVICE CHECK
Implantable Lead Implant Date: 20130729
Implantable Lead Location: 753859
Lead Channel Setting Pacing Amplitude: 2 V
Lead Channel Setting Pacing Amplitude: 2.5 V
Lead Channel Setting Pacing Pulse Width: 0.4 ms
MDC IDC LEAD IMPLANT DT: 20130729
MDC IDC LEAD LOCATION: 753860
MDC IDC PG IMPLANT DT: 20130729
MDC IDC SESS DTM: 20190220171906
MDC IDC SET LEADCHNL RV SENSING SENSITIVITY: 0.9 mV

## 2017-10-10 DIAGNOSIS — Z950 Presence of cardiac pacemaker: Secondary | ICD-10-CM | POA: Diagnosis not present

## 2017-10-10 DIAGNOSIS — M859 Disorder of bone density and structure, unspecified: Secondary | ICD-10-CM | POA: Diagnosis not present

## 2017-10-10 DIAGNOSIS — N183 Chronic kidney disease, stage 3 (moderate): Secondary | ICD-10-CM | POA: Diagnosis not present

## 2017-10-10 DIAGNOSIS — E039 Hypothyroidism, unspecified: Secondary | ICD-10-CM | POA: Diagnosis not present

## 2017-10-10 DIAGNOSIS — I10 Essential (primary) hypertension: Secondary | ICD-10-CM | POA: Diagnosis not present

## 2017-10-10 DIAGNOSIS — I471 Supraventricular tachycardia: Secondary | ICD-10-CM | POA: Diagnosis not present

## 2017-10-26 DIAGNOSIS — R300 Dysuria: Secondary | ICD-10-CM | POA: Diagnosis not present

## 2017-10-26 DIAGNOSIS — N898 Other specified noninflammatory disorders of vagina: Secondary | ICD-10-CM | POA: Diagnosis not present

## 2017-11-15 ENCOUNTER — Ambulatory Visit (INDEPENDENT_AMBULATORY_CARE_PROVIDER_SITE_OTHER): Payer: Medicare HMO | Admitting: *Deleted

## 2017-11-15 DIAGNOSIS — I495 Sick sinus syndrome: Secondary | ICD-10-CM

## 2017-11-15 NOTE — Progress Notes (Signed)
Remote pacemaker transmission.   

## 2017-11-16 LAB — CUP PACEART REMOTE DEVICE CHECK
Battery Voltage: 2.97 V
Brady Statistic AP VP Percent: 0.02 %
Brady Statistic AS VP Percent: 0 %
Brady Statistic AS VS Percent: 32.32 %
Implantable Lead Implant Date: 20130729
Implantable Lead Location: 753860
Lead Channel Impedance Value: 424 Ohm
Lead Channel Sensing Intrinsic Amplitude: 2.478 mV
Lead Channel Setting Pacing Pulse Width: 0.4 ms
MDC IDC LEAD IMPLANT DT: 20130729
MDC IDC LEAD LOCATION: 753859
MDC IDC MSMT LEADCHNL RA IMPEDANCE VALUE: 464 Ohm
MDC IDC MSMT LEADCHNL RV SENSING INTR AMPL: 10.916 mV
MDC IDC PG IMPLANT DT: 20130729
MDC IDC SESS DTM: 20190424120209
MDC IDC SET LEADCHNL RA PACING AMPLITUDE: 2 V
MDC IDC SET LEADCHNL RV PACING AMPLITUDE: 2.5 V
MDC IDC SET LEADCHNL RV SENSING SENSITIVITY: 0.9 mV
MDC IDC STAT BRADY AP VS PERCENT: 67.66 %
MDC IDC STAT BRADY RA PERCENT PACED: 67.55 %
MDC IDC STAT BRADY RV PERCENT PACED: 0.02 %

## 2017-11-17 ENCOUNTER — Encounter: Payer: Self-pay | Admitting: Cardiology

## 2017-11-30 DIAGNOSIS — H6121 Impacted cerumen, right ear: Secondary | ICD-10-CM | POA: Diagnosis not present

## 2017-11-30 DIAGNOSIS — N816 Rectocele: Secondary | ICD-10-CM | POA: Diagnosis not present

## 2017-11-30 DIAGNOSIS — N993 Prolapse of vaginal vault after hysterectomy: Secondary | ICD-10-CM | POA: Diagnosis not present

## 2017-12-21 DIAGNOSIS — R102 Pelvic and perineal pain: Secondary | ICD-10-CM | POA: Diagnosis not present

## 2017-12-21 DIAGNOSIS — N898 Other specified noninflammatory disorders of vagina: Secondary | ICD-10-CM | POA: Diagnosis not present

## 2017-12-21 DIAGNOSIS — N993 Prolapse of vaginal vault after hysterectomy: Secondary | ICD-10-CM | POA: Diagnosis not present

## 2018-01-11 DIAGNOSIS — N898 Other specified noninflammatory disorders of vagina: Secondary | ICD-10-CM | POA: Diagnosis not present

## 2018-01-11 DIAGNOSIS — N993 Prolapse of vaginal vault after hysterectomy: Secondary | ICD-10-CM | POA: Diagnosis not present

## 2018-02-08 DIAGNOSIS — N993 Prolapse of vaginal vault after hysterectomy: Secondary | ICD-10-CM | POA: Diagnosis not present

## 2018-02-08 DIAGNOSIS — N812 Incomplete uterovaginal prolapse: Secondary | ICD-10-CM | POA: Diagnosis not present

## 2018-02-14 ENCOUNTER — Ambulatory Visit (INDEPENDENT_AMBULATORY_CARE_PROVIDER_SITE_OTHER): Payer: Medicare HMO | Admitting: *Deleted

## 2018-02-14 ENCOUNTER — Telehealth: Payer: Self-pay | Admitting: Cardiology

## 2018-02-14 DIAGNOSIS — I495 Sick sinus syndrome: Secondary | ICD-10-CM | POA: Diagnosis not present

## 2018-02-14 NOTE — Telephone Encounter (Signed)
LMOVM reminding pt to send remote transmission.   

## 2018-02-15 ENCOUNTER — Encounter: Payer: Self-pay | Admitting: Cardiology

## 2018-02-15 NOTE — Progress Notes (Signed)
Remote pacemaker transmission.   

## 2018-03-22 DIAGNOSIS — N993 Prolapse of vaginal vault after hysterectomy: Secondary | ICD-10-CM | POA: Diagnosis not present

## 2018-03-22 DIAGNOSIS — N812 Incomplete uterovaginal prolapse: Secondary | ICD-10-CM | POA: Diagnosis not present

## 2018-03-25 LAB — CUP PACEART REMOTE DEVICE CHECK
Battery Voltage: 2.97 V
Brady Statistic AP VP Percent: 0.01 %
Brady Statistic RA Percent Paced: 73.22 %
Brady Statistic RV Percent Paced: 0.01 %
Implantable Lead Location: 753859
Implantable Pulse Generator Implant Date: 20130729
Lead Channel Sensing Intrinsic Amplitude: 13.304 mV
Lead Channel Setting Pacing Amplitude: 2 V
Lead Channel Setting Pacing Pulse Width: 0.4 ms
Lead Channel Setting Sensing Sensitivity: 0.9 mV
MDC IDC LEAD IMPLANT DT: 20130729
MDC IDC LEAD IMPLANT DT: 20130729
MDC IDC LEAD LOCATION: 753860
MDC IDC MSMT LEADCHNL RA IMPEDANCE VALUE: 544 Ohm
MDC IDC MSMT LEADCHNL RA SENSING INTR AMPL: 2.347 mV
MDC IDC MSMT LEADCHNL RV IMPEDANCE VALUE: 464 Ohm
MDC IDC SESS DTM: 20190724183658
MDC IDC SET LEADCHNL RV PACING AMPLITUDE: 2.5 V
MDC IDC STAT BRADY AP VS PERCENT: 73.29 %
MDC IDC STAT BRADY AS VP PERCENT: 0 %
MDC IDC STAT BRADY AS VS PERCENT: 26.7 %

## 2018-04-18 DIAGNOSIS — Z6821 Body mass index (BMI) 21.0-21.9, adult: Secondary | ICD-10-CM | POA: Diagnosis not present

## 2018-04-18 DIAGNOSIS — E039 Hypothyroidism, unspecified: Secondary | ICD-10-CM | POA: Diagnosis not present

## 2018-04-18 DIAGNOSIS — R61 Generalized hyperhidrosis: Secondary | ICD-10-CM | POA: Diagnosis not present

## 2018-04-18 DIAGNOSIS — Z950 Presence of cardiac pacemaker: Secondary | ICD-10-CM | POA: Diagnosis not present

## 2018-04-18 DIAGNOSIS — Z23 Encounter for immunization: Secondary | ICD-10-CM | POA: Diagnosis not present

## 2018-04-18 DIAGNOSIS — G479 Sleep disorder, unspecified: Secondary | ICD-10-CM | POA: Diagnosis not present

## 2018-04-18 DIAGNOSIS — I10 Essential (primary) hypertension: Secondary | ICD-10-CM | POA: Diagnosis not present

## 2018-04-18 DIAGNOSIS — N183 Chronic kidney disease, stage 3 (moderate): Secondary | ICD-10-CM | POA: Diagnosis not present

## 2018-05-16 ENCOUNTER — Ambulatory Visit (INDEPENDENT_AMBULATORY_CARE_PROVIDER_SITE_OTHER): Payer: Medicare HMO | Admitting: *Deleted

## 2018-05-16 DIAGNOSIS — I495 Sick sinus syndrome: Secondary | ICD-10-CM

## 2018-05-16 NOTE — Progress Notes (Signed)
Remote pacemaker transmission.   

## 2018-05-24 DIAGNOSIS — N993 Prolapse of vaginal vault after hysterectomy: Secondary | ICD-10-CM | POA: Diagnosis not present

## 2018-05-24 DIAGNOSIS — N3941 Urge incontinence: Secondary | ICD-10-CM | POA: Diagnosis not present

## 2018-05-24 DIAGNOSIS — R350 Frequency of micturition: Secondary | ICD-10-CM | POA: Diagnosis not present

## 2018-05-24 DIAGNOSIS — N812 Incomplete uterovaginal prolapse: Secondary | ICD-10-CM | POA: Diagnosis not present

## 2018-06-07 LAB — CUP PACEART REMOTE DEVICE CHECK
Brady Statistic AP VP Percent: 0 %
Brady Statistic AP VS Percent: 68.61 %
Brady Statistic AS VP Percent: 0 %
Brady Statistic RA Percent Paced: 68.54 %
Brady Statistic RV Percent Paced: 0 %
Date Time Interrogation Session: 20191023113517
Implantable Lead Implant Date: 20130729
Implantable Lead Location: 753859
Implantable Pulse Generator Implant Date: 20130729
Lead Channel Impedance Value: 528 Ohm
Lead Channel Sensing Intrinsic Amplitude: 10.916 mV
Lead Channel Setting Pacing Amplitude: 2 V
Lead Channel Setting Pacing Amplitude: 2.5 V
Lead Channel Setting Pacing Pulse Width: 0.4 ms
Lead Channel Setting Sensing Sensitivity: 0.9 mV
MDC IDC LEAD IMPLANT DT: 20130729
MDC IDC LEAD LOCATION: 753860
MDC IDC MSMT BATTERY VOLTAGE: 2.97 V
MDC IDC MSMT LEADCHNL RA SENSING INTR AMPL: 2.261 mV
MDC IDC MSMT LEADCHNL RV IMPEDANCE VALUE: 432 Ohm
MDC IDC STAT BRADY AS VS PERCENT: 31.39 %

## 2018-07-26 DIAGNOSIS — Z4689 Encounter for fitting and adjustment of other specified devices: Secondary | ICD-10-CM | POA: Diagnosis not present

## 2018-07-26 DIAGNOSIS — N8111 Cystocele, midline: Secondary | ICD-10-CM | POA: Diagnosis not present

## 2018-08-20 ENCOUNTER — Encounter: Payer: Self-pay | Admitting: Cardiology

## 2018-08-24 ENCOUNTER — Encounter: Payer: Self-pay | Admitting: Cardiovascular Disease

## 2018-08-24 ENCOUNTER — Ambulatory Visit: Payer: Medicare HMO | Admitting: Cardiovascular Disease

## 2018-08-24 VITALS — BP 148/70 | HR 62 | Ht 63.5 in | Wt 127.0 lb

## 2018-08-24 DIAGNOSIS — I471 Supraventricular tachycardia, unspecified: Secondary | ICD-10-CM

## 2018-08-24 DIAGNOSIS — I10 Essential (primary) hypertension: Secondary | ICD-10-CM | POA: Diagnosis not present

## 2018-08-24 DIAGNOSIS — Z950 Presence of cardiac pacemaker: Secondary | ICD-10-CM

## 2018-08-24 DIAGNOSIS — I495 Sick sinus syndrome: Secondary | ICD-10-CM

## 2018-08-24 MED ORDER — METOPROLOL SUCCINATE ER 25 MG PO TB24: 25.0000 mg | ORAL_TABLET | Freq: Every day | ORAL | 3 refills | Status: DC

## 2018-08-24 NOTE — Patient Instructions (Signed)
Medication Instructions:  DECREASE losartan-hctz by half - cut your tablets in half START metoprolol succiante 25mg  daily If you need a refill on your cardiac medications before your next appointment, please call your pharmacy.   Follow-Up: At [facility], you and your health needs are our priority.  As part of our continuing mission to provide you with exceptional heart care, we have created designated Provider Care Teams.  These Care Teams include your primary Cardiologist (physician) and Advanced Practice Providers (APPs -  Physician Assistants and Nurse Practitioners) who all work together to provide you with the care you need, when you need it. You will need a follow up appointment in 6 months.  Please call our office 2 months in advance to schedule this appointment.  You may see Sanda Klein, MD or one of the following Advanced Practice Providers on your designated Care Team: [place], [place] . Fabian Sharp, PA-C  Any Other Special Instructions Will Be Listed Below (If Applicable).

## 2018-08-24 NOTE — Progress Notes (Signed)
Patient ID: Katelyn Morris, female   DOB: [birth year], 85 y.o.   MRN: [id number]    Cardiology Office Note    Date:  08/24/2018   ID:  Katelyn Morris 03-May-1923, MRN [id number]  PCP:  Melinda Crutch (Inactive)  Cardiologist:   Sanda Klein, MD   Chief Complaint  Patient presents with  . Follow-up  Pacemaker check  History of Present Illness:  Katelyn Morris is a 85 y.o. female with history of sinus node dysfunction and pacemaker implantation (Medtronic Revo MRI conditional 2013) who returns for a device check.   Katelyn Morris continues to live independently taking care of her own household.  She generally feels well without major cardiac complaints.  She does occasionally become out of breath when she feels her heart pounding.  This is often triggered by more than usual physical activity.  She complains of sweats at night and frequent urination at night.  As before her pacemaker check shows occasional brief episodes of paroxysmal atrial tachycardia.  These often last less than a minute but the longest one in the last year has been 8 minutes in duration.  There is 1: 1 AV conduction and the typical rate has been in the 150s-160s.  The longest ever recorded was only 12 minutes.  There has never been evidence of atrial fibrillation.  Arrhythmia onset is not well documented, but the appearance is suspicious for AV node reentry.  Some of the episodes are labeled as nonsustained VT, but are clearly the same atrial tachycardia.  I only found one episode of true nonsustained VT that I could confirmed by intracardiac electrogram and it was only 8 beats long.  The patient specifically denies any chest pain at rest exertion, dyspnea at rest or with exertion, orthopnea, paroxysmal nocturnal dyspnea, syncope, focal neurological deficits, intermittent claudication, lower extremity edema, unexplained weight gain, cough, hemoptysis or wheezing.  She has normal left ventricular systolic function and  aortic valve sclerosis explaining her systolic murmur. Her right carotid bruit was not associated with any significant obstruction by ultrasonography. A remote nuclear stress test did not show perfusion abnormalities.  Pacemaker interrogation shows normal device function.  Her Medtronic Revo device was implanted in 2013, battery voltage is 2.97V from (RRT=2.81 V). There is 67.7 % atrial pacing and <0.1%ventricular pacing.  A total of 85 episodes of atrial tachycardia with 1: 1 AV conduction have been recorded in roughly 12 months. Past attempts to perform manual testing of the ventricular pacing threshold repeatedly led to induction of brief nonsustained atrial tachycardia, suggesting AV node-dependent mechanism.   Past Medical History:  Diagnosis Date  . Arthritis    in knees per patient  . Dysrhythmia    Bradycardia  . Heart murmur   . Hypertension   . Hypothyroidism   . Pacemaker 02/20/2012   dual chamber  . SSS (sick sinus syndrome) ([place]) 08/06/2014    Past Surgical History:  Procedure Laterality Date  . ABDOMINAL HYSTERECTOMY    . bladder tack    . INSERT / REPLACE / REMOVE PACEMAKER    . PERMANENT PACEMAKER INSERTION N/A 02/20/2012   Procedure: PERMANENT PACEMAKER INSERTION;  Surgeon: Sanda Klein, MD;  Location: [facility] CATH [facility];  Service: Cardiovascular;  Laterality: N/A;  . ROTATOR CUFF REPAIR     bilateral    Outpatient Medications Prior to Visit  Medication Sig Dispense Refill  . ALPRAZolam (XANAX) 0.5 MG tablet Take 0.25 mg by mouth at bedtime as needed.    Marland Kitchen aspirin  81 MG tablet Take 81 mg by mouth daily.    . Cholecalciferol (VITAMIN D3 PO) Take 1 capsule by mouth daily.    Marland Kitchen levothyroxine (SYNTHROID, LEVOTHROID) 50 MCG tablet Take 50 mcg by mouth daily.    Marland Kitchen losartan-hydrochlorothiazide (HYZAAR) 50-12.5 MG per tablet Take 0.5 tablets by mouth daily.     . Multiple Minerals-Vitamins (CALCIUM & VIT D3 BONE HEALTH PO) Take 2,000 mg by mouth daily. Take 3 tablets (6000mg )  daily     No facility-administered medications prior to visit.      Allergies:   Sulfa antibiotics and Sulfonamide derivatives   Social History   Socioeconomic History  . Marital status: Widowed    Spouse name: Not on file  . Number of children: Not on file  . Years of education: Not on file  . Highest education level: Not on file  Occupational History  . Not on file  Social Needs  . Financial resource strain: Not on file  . Food insecurity:    Worry: Not on file    Inability: Not on file  . Transportation needs:    Medical: Not on file    Non-medical: Not on file  Tobacco Use  . Smoking status: Never Smoker  . Smokeless tobacco: Never Used  Substance and Sexual Activity  . Alcohol use: No  . Drug use: No  . Sexual activity: Never    Birth control/protection: Post-menopausal  Lifestyle  . Physical activity:    Days per week: Not on file    Minutes per session: Not on file  . Stress: Not on file  Relationships  . Social connections:    Talks on phone: Not on file    Gets together: Not on file    Attends religious service: Not on file    Active member of club or organization: Not on file    Attends meetings of clubs or organizations: Not on file    Relationship status: Not on file  Other Topics Concern  . Not on file  Social History Narrative  . Not on file      ROS:   Please see the history of present illness.    ROS  All other systems are reviewed and are negative  PHYSICAL EXAM:   VS:  BP (!) 148/70   Pulse 62   Ht 5' 3.5" (1.613 m)   Wt 127 lb (57.6 kg)   BMI 22.14 kg/m    General: Alert, oriented x3, no distress, appears substantially younger than stated age Head: no evidence of trauma, PERRL, EOMI, no exophtalmos or lid lag, no myxedema, no xanthelasma; normal ears, nose and oropharynx Neck: normal jugular venous pulsations and no hepatojugular reflux; brisk carotid pulses without delay and no carotid bruits Chest: clear to auscultation, no  signs of consolidation by percussion or palpation, normal fremitus, symmetrical and full respiratory excursions Cardiovascular: normal position and quality of the apical impulse, regular rhythm, normal first and second heart sounds, 1-2/6 early peaking systolic ejection murmur in the aortic focus, no diastolic murmurs, rubs or gallops.  Healthy appearing left subclavian pacemaker site Abdomen: no tenderness or distention, no masses by palpation, no abnormal pulsatility or arterial bruits, normal bowel sounds, no hepatosplenomegaly Extremities: no clubbing, cyanosis or edema; 2+ radial, ulnar and brachial pulses bilaterally; 2+ right femoral, posterior tibial and dorsalis pedis pulses; 2+ left femoral, posterior tibial and dorsalis pedis pulses; no subclavian or femoral bruits Neurological: grossly nonfocal Psych: Normal mood and affect    Wt  Readings from Last 3 Encounters:  08/24/18 127 lb (57.6 kg)  08/16/17 124 lb 9.6 oz (56.5 kg)  08/17/16 128 lb (58.1 kg)      Studies/Labs Reviewed:   EKG:  EKG is ordered today.  The ekg ordered today shows atrial paced, ventricular sensed rhythm with QS pattern in leads V1 and V2, T wave inversion in V4-V6 unchanged from previous tracings, QTC 480 ms  ms Lipid Panel    Component Value Date/Time   CHOL 208 (H) 08/05/2014 0922   TRIG 202 (H) 08/05/2014 0922   HDL 45 08/05/2014 0922   CHOLHDL 4.6 08/05/2014 0922   VLDL 40 08/05/2014 0922   LDLCALC 123 (H) 08/05/2014 4503      ASSESSMENT:    1. SVT (supraventricular tachycardia) ([place])   2. SSS (sick sinus syndrome) ([place])   3. Pacemaker - Medtronic Revo, dual chamber 2013   4. Essential hypertension      PLAN:  In order of problems listed above:  1. SVT: Recurrent paroxysmal atrial tachycardia, quite possibly AV node reentry stenosis of the arrhythmia can be induced during ventricular threshold testing.  We will add metoprolol succinate 25 mg once daily. 2. SSS: Presented with  symptomatic sinus bradycardia and presyncope.  Heart rate histogram distribution appears adequate for her activity level. 3. PM: Normal device function.  Remote download in 3 months, office visit in 6 months. 4. HTN: Blood pressure checked at home is consistently in the 120s-130s/70s.  As we are adding metoprolol, we cut her losartan-hydrochlorothiazide dose in half.    Medication Adjustments/Labs and Tests Ordered: Current medicines are reviewed at length with the patient today.  Concerns regarding medicines are outlined above.  Medication changes, Labs and Tests ordered today are listed in the Patient Instructions below. Patient Instructions  Medication Instructions:  DECREASE losartan-hctz by half - cut your tablets in half START metoprolol succiante 25mg  daily If you need a refill on your cardiac medications before your next appointment, please call your pharmacy.   Follow-Up: At [facility], you and your health needs are our priority.  As part of our continuing mission to provide you with exceptional heart care, we have created designated Provider Care Teams.  These Care Teams include your primary Cardiologist (physician) and Advanced Practice Providers (APPs -  Physician Assistants and Nurse Practitioners) who all work together to provide you with the care you need, when you need it. You will need a follow up appointment in 6 months.  Please call our office 2 months in advance to schedule this appointment.  You may see Sanda Klein, MD or one of the following Advanced Practice Providers on your designated Care Team: [place], [place] . Fabian Sharp, PA-C  Any Other Special Instructions Will Be Listed Below (If Applicable).    Signed, Sanda Klein, MD  08/24/2018 12:48 PM    [facility], [place], [place]  [address] Phone: [phone]; Fax: [phone]

## 2018-08-31 LAB — CUP PACEART INCLINIC DEVICE CHECK
Date Time Interrogation Session: 20200207104659
Implantable Lead Implant Date: 20130729
Implantable Lead Implant Date: 20130729
Implantable Lead Location: 753859
Implantable Pulse Generator Implant Date: 20130729
MDC IDC LEAD LOCATION: 753860

## 2018-10-30 ENCOUNTER — Telehealth: Payer: Self-pay | Admitting: Cardiovascular Disease

## 2018-10-30 NOTE — Telephone Encounter (Signed)
I suspect there may have been an issue with the availability of losartan-HCTZ which has been an on again-off again problem. Her blood pressure control is excellent on the current regimen and we need to make sure that she continues the same doses of medications. Meaning that she should take 25 mg of losartan once daily and hydrochlorothiazide 6.25 mg (half tablet) once daily. If the hydrochlorothiazide 12.5 mg tablet is too small to break in two it is perfectly fine to simply take a whole tablet every other day (for example, take it only on odd days of the month, skip it on even days of the month).

## 2018-10-30 NOTE — Telephone Encounter (Signed)
So currently she would be okay to continue the combined medication that she has been taking because this is what she currently has at home.   Wanted to clarify- thanks!

## 2018-10-30 NOTE — Telephone Encounter (Signed)
  Ms Katelyn Morris is calling for the patient to discuss some medication issues and ask some questions. She states that the patient has confusion and so Ms Droll would like to speak with Dr Sallyanne Kuster to get some clarification on some things

## 2018-10-30 NOTE — Telephone Encounter (Signed)
Called patient spoke with daughter. She states that her mother has been doing well, but they are needing clarification on medication. She states her mother has been taking Losartan/HCTZ 50 mg-12.5 and cutting that in half as well as the Metoprolol that she was placed on at last OV.   Daughter states that something got switched with [facility] and around November of 2019 they found prescriptions that were sent to her that were separate medications of Losartan 25 mg BID and another RX of HCTZ of 12.5; she would like to know if she should continue the combined medication before we send in a refill or if you wanted her to do separate pills, and also if she should continue the metoprolol at this time.  She gave blood pressure readings The lowest in the morning has been 127/67 And the highest in the morning it has been 135/71  The lowest in the evening has been 132/61 And the highest in the evening has been 143/75.  Daughter states you can call her directly- or advise and I will contact.  Thanks!

## 2018-11-01 MED ORDER — LOSARTAN POTASSIUM 25 MG PO TABS: 25.0000 mg | ORAL_TABLET | Freq: Every day | ORAL | 3 refills | Status: DC

## 2018-11-01 MED ORDER — HYDROCHLOROTHIAZIDE 12.5 MG PO TABS: 6.2500 mg | ORAL_TABLET | Freq: Every day | ORAL | 3 refills | Status: DC

## 2018-11-01 NOTE — Telephone Encounter (Signed)
New Rx sent to pharmacy

## 2018-11-01 NOTE — Telephone Encounter (Signed)
Follow up    Katelyn Morris is calling to request a rx to be sent to pharmacy for this patient how Dr. Sallyanne Kuster would like for her take the medication. 25 mg of losartan once daily and hydrochlorothiazide 6.25 mg (half tablet) once daily.  Due to the patient being confused and then they have a correct rx on file

## 2019-01-07 DIAGNOSIS — N993 Prolapse of vaginal vault after hysterectomy: Secondary | ICD-10-CM | POA: Diagnosis not present

## 2019-01-14 DIAGNOSIS — Z0000 Encounter for general adult medical examination without abnormal findings: Secondary | ICD-10-CM | POA: Diagnosis not present

## 2019-01-14 DIAGNOSIS — Z950 Presence of cardiac pacemaker: Secondary | ICD-10-CM | POA: Diagnosis not present

## 2019-01-14 DIAGNOSIS — E039 Hypothyroidism, unspecified: Secondary | ICD-10-CM | POA: Diagnosis not present

## 2019-01-14 DIAGNOSIS — N183 Chronic kidney disease, stage 3 (moderate): Secondary | ICD-10-CM | POA: Diagnosis not present

## 2019-01-14 DIAGNOSIS — G479 Sleep disorder, unspecified: Secondary | ICD-10-CM | POA: Diagnosis not present

## 2019-01-14 DIAGNOSIS — I129 Hypertensive chronic kidney disease with stage 1 through stage 4 chronic kidney disease, or unspecified chronic kidney disease: Secondary | ICD-10-CM | POA: Diagnosis not present

## 2019-01-24 DIAGNOSIS — L299 Pruritus, unspecified: Secondary | ICD-10-CM | POA: Diagnosis not present

## 2019-01-24 DIAGNOSIS — L237 Allergic contact dermatitis due to plants, except food: Secondary | ICD-10-CM | POA: Diagnosis not present

## 2019-01-31 DIAGNOSIS — Z135 Encounter for screening for eye and ear disorders: Secondary | ICD-10-CM | POA: Diagnosis not present

## 2019-01-31 DIAGNOSIS — H52209 Unspecified astigmatism, unspecified eye: Secondary | ICD-10-CM | POA: Diagnosis not present

## 2019-01-31 DIAGNOSIS — Z961 Presence of intraocular lens: Secondary | ICD-10-CM | POA: Diagnosis not present

## 2019-01-31 DIAGNOSIS — I10 Essential (primary) hypertension: Secondary | ICD-10-CM | POA: Diagnosis not present

## 2019-02-04 ENCOUNTER — Telehealth: Payer: Self-pay | Admitting: Cardiovascular Disease

## 2019-02-04 DIAGNOSIS — R609 Edema, unspecified: Secondary | ICD-10-CM | POA: Diagnosis not present

## 2019-02-04 NOTE — Telephone Encounter (Signed)
New Message  Patient's daughter-in-law [name]) called in. States that patient has always had swollen ankles that have never given her any issues. Currently one of them in "weeping"/leaking fluids, and [name] wants to know if it would help if the patient takes the whole hydrochlorothiazide (HYDRODIURIL) 12.5 MG tablet versus half of the pill. She says that she has tried elevating the leg, cold compresses, as well as bandaging the leg. Please call daughter-in-law [name] back to advise on what to do.

## 2019-02-04 NOTE — Telephone Encounter (Signed)
Spoke with pt's daughter in law who report pt has been experiencing swelling and weeping in her left ankle. She report pt has been elevating her leg and wearing compressions but it hasn't helped. Daughter in law states pt denies any other symptoms and HR and BP has been WNL. Pt has an appointment scheduled to see Dr. Loletha Grayer on 7/20 but she was wondering if pt can take full does of Hydrochlorothiazide 12.5 mg in the meantime.   Will route to MD

## 2019-02-04 NOTE — Telephone Encounter (Signed)
Left message to call back  

## 2019-02-04 NOTE — Telephone Encounter (Signed)
New Message ° ° °Patient returning your call. °

## 2019-02-05 NOTE — Telephone Encounter (Signed)
Yes, she can do that. Increase to HCTZ 12.5 mg daily

## 2019-02-05 NOTE — Telephone Encounter (Signed)
Called patient, advised of med change- she verbalized understanding.  Asked that I call her daughter in law and advise as well, spoke with [name] her daughter in law and explained. They verbalized understanding.

## 2019-02-08 ENCOUNTER — Telehealth: Payer: Self-pay | Admitting: Cardiovascular Disease

## 2019-02-08 NOTE — Telephone Encounter (Addendum)
Called patient's son back. They wanted to know if the patient's daughter-in-law could accompany her into her appointment on Monday. I explained due to covid restrictions this is not allowed unless it is a safety concern. He verbally understood. I did let them know we can do a conference call with the daughter-in-law, patient, and doctor. He agreed to this. Will inform Dr. Victorino December staff.

## 2019-02-08 NOTE — Telephone Encounter (Signed)
Call placed to the patient's son and daughter in law. The daughter in law will need to accompany the patient to help her physically for the appointment. They have both been prescreened      COVID-19 Pre-Screening Questions:  . In the past 7 to 10 days have you had a cough,  shortness of breath, headache, congestion, fever (100 or greater) body aches, chills, sore throat, or sudden loss of taste or sense of smell? No . Have you been around anyone with known Covid 19. No . Have you been around anyone who is awaiting Covid 19 test results in the past 7 to 10 days? No . Have you been around anyone who has been exposed to Covid 19, or has mentioned symptoms of Covid 19 within the past 7 to 10 days? No  If you have any concerns/questions about symptoms patients report during screening (either on the phone or at threshold). Contact the provider seeing the patient or DOD for further guidance.  If neither are available contact a member of the leadership team.

## 2019-02-08 NOTE — Telephone Encounter (Signed)
New Message    Patient's daughter in law would like to come up to appt with patient and wants to know if that's ok.  Please call patient back to discuss.  Leave a detailed message if they don't pick up the phone.

## 2019-02-11 ENCOUNTER — Other Ambulatory Visit: Payer: Self-pay

## 2019-02-11 ENCOUNTER — Ambulatory Visit (INDEPENDENT_AMBULATORY_CARE_PROVIDER_SITE_OTHER): Payer: Medicare HMO | Admitting: Cardiovascular Disease

## 2019-02-11 ENCOUNTER — Telehealth: Payer: Self-pay

## 2019-02-11 ENCOUNTER — Encounter: Payer: Self-pay | Admitting: Cardiovascular Disease

## 2019-02-11 VITALS — BP 185/84 | HR 69 | Temp 97.0°F | Ht 63.0 in | Wt 123.4 lb

## 2019-02-11 DIAGNOSIS — R001 Bradycardia, unspecified: Secondary | ICD-10-CM

## 2019-02-11 DIAGNOSIS — I83892 Varicose veins of left lower extremities with other complications: Secondary | ICD-10-CM | POA: Diagnosis not present

## 2019-02-11 DIAGNOSIS — Z950 Presence of cardiac pacemaker: Secondary | ICD-10-CM

## 2019-02-11 DIAGNOSIS — I495 Sick sinus syndrome: Secondary | ICD-10-CM

## 2019-02-11 DIAGNOSIS — I951 Orthostatic hypotension: Secondary | ICD-10-CM | POA: Diagnosis not present

## 2019-02-11 DIAGNOSIS — I471 Supraventricular tachycardia, unspecified: Secondary | ICD-10-CM

## 2019-02-11 DIAGNOSIS — I10 Essential (primary) hypertension: Secondary | ICD-10-CM

## 2019-02-11 DIAGNOSIS — M7989 Other specified soft tissue disorders: Secondary | ICD-10-CM

## 2019-02-11 NOTE — Telephone Encounter (Signed)
I called the pt per Dr. Sallyanne Kuster to help her trouble shoot her monitor. Her monitor gave the 3248 error code. I told her I will contact [employer] to get them to call her to help trouble shoot her monitor.

## 2019-02-11 NOTE — Progress Notes (Signed)
Patient ID: Katelyn Morris, female   DOB: 24-Feb-1923, 85 y.o.   MRN: [id number]    Cardiology Office Note    Date:  02/11/2019   ID:  Ymani, Porcher [birth year], MRN [id number]  PCP:  Melinda Crutch (Inactive)  Cardiologist:   Sanda Klein, MD   No chief complaint on file. Pacemaker check  History of Present Illness:  Katelyn Morris is a 85 y.o. female with history of sinus node dysfunction and pacemaker implantation ([employer]tr[employer] Revo MRI conditional 2013), paroxysmal atrial tachycardia, essential hypertension, varicose veins who returns for a pacemaker check and to discuss lower extremity edema and elevated blood pressure.  At her last appointment she had complaints of sudden spells of unexplained weakness and dyspnea, which would resolve spontaneously after sitting down for several minutes.  These appeared to coincide with bursts of paroxysmal atrial tachycardia, some up to 20 minutes in duration.  Metoprolol was added and her dose of diuretic was decreased to avoid hypotension.    Her episodes of weakness/dyspnea have improved, virtually resolved over the last few months.  However, she gradually developed lower extremity edema, particularly prominent of the left side, which has been the case ever since an ankle injury years ago.  At one point she developed weeping from the skin.  Restarted hydrochlorothiazide, kept the leg elevated and put on compression stockings with improvement.  There is only mild edema today and the weeping has stopped.  She also complains of some tightness and tingling in that limb and is worried about vascular issues.  Katelyn Morris continues to live independently taking care of her own household.  She loves working in her garden.  Her daughter points out that she does not drink enough fluids.  She often feels that her legs are weak and she is wobbly when she first gets up in the morning.  She generally feels well without major cardiac complaints, specifically denying  syncope, angina, dyspnea at rest, claudication or focal neurological complaints.  She brings a very detailed recording of her blood pressure with her home monitor.  These are diligently recorded on a daily basis and she sometimes will record both sitting and standing blood pressure.  There is clear evidence of orthostatic hypotension with a drop in systolic blood pressure of a minimum of 15 mmHg, frequently dropping 35 mmHg, down to the 90-95 mmHg range.  Having said that, her systolic blood pressure is widely variable from day to day, in the 120-170 range.  She has not had her home monitor checked in the office recently.  Her pacemaker transmitter has not been working so today is the first time we checked her blood pressure since February.  Her [employer] Revo device was implanted in 2013. Device function is normal.  Battery voltage is 2.96 V (RRT 2.81 V).  Both the atrial and ventricular leads have normal parameters.  She has 78% atrial pacing and never has ventricular pacing.  In the last 6 months there have been 12 episodes of regular tachycardia, the longest 7 minutes in duration.  They all show 1:1 AV conduction and appear to represent ectopic atrial tachycardia.  The overall burden is very low, less than 0.1% of the time.  The typical tachycardia cycle length is around 380 ms, but one episode had a period with a cycle length of 300 ms (200 bpm).  Past attempts to perform manual testing of the ventricular pacing threshold repeatedly led to induction of brief nonsustained atrial tachycardia, suggesting AV node-dependent mechanism,  but there is no clear evidence of long-short sequence initiation in the AV and V electrograms are not superimposed.  She has normal left ventricular systolic function and aortic valve sclerosis explaining her systolic murmur. Her right carotid bruit was not associated with any significant obstruction by ultrasonography. A remote nuclear stress test did not show perfusion  abnormalities.     Past Medical History:  Diagnosis Date   Arthritis    in knees per patient   Dysrhythmia    Bradycardia   Heart murmur    Hypertension    Hypothyroidism    Pacemaker 02/20/2012   dual chamber   SSS (sick sinus syndrome) ([place]) 08/06/2014    Past Surgical History:  Procedure Laterality Date   ABDOMINAL HYSTERECTOMY     bladder tack     INSERT / REPLACE / REMOVE PACEMAKER     PERMANENT PACEMAKER INSERTION N/A 02/20/2012   Procedure: PERMANENT PACEMAKER INSERTION;  Surgeon: Sanda Klein, MD;  Location: [facility];  Service: Cardiovascular;  Laterality: N/A;   ROTATOR CUFF REPAIR     bilateral    Outpatient Medications Prior to Visit  Medication Sig Dispense Refill   ALPRAZolam (XANAX) 0.5 MG tablet Take 0.25 mg by mouth at bedtime as needed.     aspirin 81 MG tablet Take 81 mg by mouth daily.     Cholecalciferol (VITAMIN D3 PO) Take 1 capsule by mouth daily.     hydrochlorothiazide (HYDRODIURIL) 12.5 MG tablet Take 0.5 tablets (6.25 mg total) by mouth daily. 45 tablet 3   levothyroxine (SYNTHROID, LEVOTHROID) 50 MCG tablet Take 50 mcg by mouth daily.     losartan (COZAAR) 25 MG tablet Take 1 tablet (25 mg total) by mouth daily. 90 tablet 3   metoprolol succinate (TOPROL-XL) 25 MG 24 hr tablet Take 1 tablet (25 mg total) by mouth daily. 90 tablet 3   No facility-administered medications prior to visit.      Allergies:   Sulfa antibiotics and Sulfonamide derivatives   Social History   Socioeconomic History   Marital status: Widowed    Spouse name: Not on file   Number of children: Not on file   Years of education: Not on file   Highest education level: Not on file  Occupational History   Not on file  Social Needs   Financial resource strain: Not on file   Food insecurity    Worry: Not on file    Inability: Not on file   Transportation needs    Medical: Not on file    Non-medical: Not on file  Tobacco Use   Smoking  status: Never Smoker   Smokeless tobacco: Never Used  Substance and Sexual Activity   Alcohol use: No   Drug use: No   Sexual activity: Never    Birth control/protection: Post-menopausal  Lifestyle   Physical activity    Days per week: Not on file    Minutes per session: Not on file   Stress: Not on file  Relationships   Social connections    Talks on phone: Not on file    Gets together: Not on file    Attends religious service: Not on file    Active member of club or organization: Not on file    Attends meetings of clubs or organizations: Not on file    Relationship status: Not on file  Other Topics Concern   Not on file  Social History Narrative   Not on file  ROS:   Please see the history of present illness.    ROS  All other systems are reviewed and are negative PHYSICAL EXAM:   VS:  BP (!) 185/84    Pulse 69    Temp (!) 97 F (36.1 C)    Ht 5\' 3"  (1.6 m)    Wt 123 lb 6.4 oz (56 kg)    SpO2 97%    BMI 21.86 kg/m    General: Alert, oriented x3, no distress, she is very slender but also appears spry and substantially younger than her stated age. Head: no evidence of trauma, PERRL, EOMI, no exophtalmos or lid lag, no myxedema, no xanthelasma; normal ears, nose and oropharynx Neck: normal jugular venous pulsations and no hepatojugular reflux; brisk carotid pulses without delay; there is a faint right carotid bruit Chest: clear to auscultation, no signs of consolidation by percussion or palpation, normal fremitus, symmetrical and full respiratory excursions.  Healthy left subclavian pacemaker site Cardiovascular: normal position and quality of the apical impulse, regular rhythm, normal first and second heart sounds, 1-2/6 early peaking systolic ejection murmur, no diastolic murmurs, rubs or gallops Abdomen: no tenderness or distention, no masses by palpation, no abnormal pulsatility or arterial bruits, normal bowel sounds, no hepatosplenomegaly Extremities: no  clubbing, cyanosis; she has very prominent bilateral varicose veins and has 1+ left ankle edema; 2+ radial, ulnar and brachial pulses bilaterally; 2+ right femoral, posterior tibial and dorsalis pedis pulses; 2+ left femoral, posterior tibial and dorsalis pedis pulses; no subclavian or femoral bruits Neurological: grossly nonfocal except hard of hearing Psych: Normal mood and affect    Wt Readings from Last 3 Encounters:  02/11/19 123 lb 6.4 oz (56 kg)  08/24/18 127 lb (57.6 kg)  08/16/17 124 lb 9.6 oz (56.5 kg)      Studies/Labs Reviewed:   EKG: The intracardiac electrogram shows atrial paced, ventricular sensed rhythm.  EKG is not ordered today.  The ekg ordered January 31 shows atrial paced, ventricular sensed rhythm with QS pattern in leads V1 and V2, T wave inversion in V4-V6 unchanged from previous tracings, QTC 480 ms  ms Lipid Panel    Component Value Date/Time   CHOL 208 (H) 08/05/2014 0922   TRIG 202 (H) 08/05/2014 0922   HDL 45 08/05/2014 0922   CHOLHDL 4.6 08/05/2014 0922   VLDL 40 08/05/2014 0922   LDLCALC 123 (H) 08/05/2014 3875      ASSESSMENT:    1. Paroxysmal SVT (supraventricular tachycardia) ([place])   2. SSS (sick sinus syndrome) ([place])   3. Pacemaker - [employer] Revo, dual chamber 2013   4. Essential hypertension   5. Orthostatic hypotension   6. Varicose veins of left leg with edema   7. Left leg swelling      PLAN:  In order of problems listed above:  1. SVT: Unclear whether this represents AV node reentry or ectopic atrial tachycardia.  Subjectively better after we added metoprolol although several episodes have been recorded by her pacemaker (one lasting for 7 minutes).  No changes in the metoprolol dose were made today. 2. SSS: Presented with symptomatic sinus bradycardia and presyncope.  No evidence of chronotropic incompetence on the heart rate histogram distribution. 3. PM: Normal device function.  Unable to get remote transmissions.  Will  troubleshoot via our device clinic.  Should have remote downloads every 3 months.  She is not pacemaker dependent. 4. HTN: Her blood pressure at home is quite volatile, but often elevated.  Need to get  her home blood pressure cuff checked.  Recently restarted hydrochlorothiazide but its full effect has not yet "kicked in".  Asked her to report a daily blood pressure log in August 1, checking both standing and sitting blood pressures. 5. Orthostatic hypotension: Relatively severe (35 mmHg), cannot compensate via tachycardia due to her conduction system abnormalities, mildly symptomatic in the mornings where she feels weak and wobbly.  She did not tolerate complete discontinuation of her diuretic so we have added it back. Need to tolerate systolic blood pressure in the 150-160 range since she has substantial orthostatic hypotension. 6. Leg edema: She is concerned about possible blood clots and will check a lower extremity venous ultrasound.  I suspect that the varicose veins are sufficient to explain her symptoms and the swelling.  Encouraged frequent use of the compression stockings and keeping the left leg elevated.    Medication Adjustments/Labs and Tests Ordered: Current medicines are reviewed at length with the patient today.  Concerns regarding medicines are outlined above.  Medication changes, Labs and Tests ordered today are listed in the Patient Instructions below. Patient Instructions  Medication Instructions:  Your physician recommends that you continue on your current medications as directed. Please refer to the Current Medication list given to you today.  If you need a refill on your cardiac medications before your next appointment, please call your pharmacy.   Lab work: Your provider would like for you to have the following labs today: CMET  If you have labs (blood work) drawn today and your tests are completely normal, you will receive your results only by: [place] (if you  have MyChart) OR A paper copy in the mail If you have any lab test that is abnormal or we need to change your treatment, we will call you to review the results.  Testing/Procedures: Your physician has requested that you have a lower extremity venous duplex. This test is an ultrasound of the veins in the legs. It looks at venous blood flow that carries blood from the heart to the legs. Allow one hour for a Lower Venous exam. There are no restrictions or special instructions.  Follow-Up: At [facility], you and your health needs are our priority.  As part of our continuing mission to provide you with exceptional heart care, we have created designated Provider Care Teams.  These Care Teams include your primary Cardiologist (physician) and Advanced Practice Providers (APPs -  Physician Assistants and Nurse Practitioners) who all work together to provide you with the care you need, when you need it. You will need a follow up appointment in 6 months.  Please call our office 2 months in advance to schedule this appointment.  You may see Sanda Klein, MD or one of the following Advanced Practice Providers on your designated Care Team: Almyra Deforest, [place] Fabian Sharp, [place]  Any Other Special Instructions Will Be Listed Below (If Applicable). Please bring your blood pressure cuff when you come for your appointment for the lower venous so that we make check it  Please check your blood pressure when you are standing and sitting. Wait at least 1-3 minutes when changing positions before checking the blood pressure. Please keep a log of these readings and call back around 02/23/[phone]      Signed, Sanda Klein, MD  02/11/2019 12:44 PM    [address], [place], [place]  [address] Phone: [phone]; Fax: [phone]

## 2019-02-11 NOTE — Patient Instructions (Addendum)
Medication Instructions:  Your physician recommends that you continue on your current medications as directed. Please refer to the Current Medication list given to you today.  If you need a refill on your cardiac medications before your next appointment, please call your pharmacy.   Lab work: Your provider would like for you to have the following labs today: CMET  If you have labs (blood work) drawn today and your tests are completely normal, you will receive your results only by: [place] (if you have MyChart) OR A paper copy in the mail If you have any lab test that is abnormal or we need to change your treatment, we will call you to review the results.  Testing/Procedures: Your physician has requested that you have a lower extremity venous duplex. This test is an ultrasound of the veins in the legs. It looks at venous blood flow that carries blood from the heart to the legs. Allow one hour for a Lower Venous exam. There are no restrictions or special instructions.  Follow-Up: At [facility], you and your health needs are our priority.  As part of our continuing mission to provide you with exceptional heart care, we have created designated Provider Care Teams.  These Care Teams include your primary Cardiologist (physician) and Advanced Practice Providers (APPs -  Physician Assistants and Nurse Practitioners) who all work together to provide you with the care you need, when you need it. You will need a follow up appointment in 6 months.  Please call our office 2 months in advance to schedule this appointment.  You may see Sanda Klein, MD or one of the following Advanced Practice Providers on your designated Care Team: Almyra Deforest, [place] Fabian Sharp, [place]  Any Other Special Instructions Will Be Listed Below (If Applicable). Please bring your blood pressure cuff when you come for your appointment for the lower venous so that we make check it  Please check your blood pressure when you  are standing and sitting. Wait at least 1-3 minutes when changing positions before checking the blood pressure. Please keep a log of these readings and call back around 02/23/[phone]

## 2019-02-12 ENCOUNTER — Ambulatory Visit (HOSPITAL_COMMUNITY)
Admission: RE | Admit: 2019-02-12 | Discharge: 2019-02-12 | Disposition: A | Discharge status: Home / Self Care | Payer: Medicare HMO | Source: Ambulatory Visit | Attending: Cardiovascular Disease | Admitting: Cardiovascular Disease

## 2019-02-12 DIAGNOSIS — M7989 Other specified soft tissue disorders: Secondary | ICD-10-CM | POA: Diagnosis not present

## 2019-02-12 LAB — COMPREHENSIVE METABOLIC PANEL
ALT: 15 IU/L (ref 0–32)
AST: 18 IU/L (ref 0–40)
Albumin/Globulin Ratio: 1.7 (ref 1.2–2.2)
Albumin: 4.3 g/dL (ref 3.5–4.6)
Alkaline Phosphatase: 74 IU/L (ref 39–117)
BUN/Creatinine Ratio: 23 (ref 12–28)
BUN: 22 mg/dL (ref 10–36)
Bilirubin Total: 0.5 mg/dL (ref 0.0–1.2)
CO2: 25 mmol/L (ref 20–29)
Calcium: 9.6 mg/dL (ref 8.7–10.3)
Chloride: 101 mmol/L (ref 96–106)
Creatinine, Ser: 0.94 mg/dL (ref 0.57–1.00)
GFR calc Af Amer: 60 mL/min/{1.73_m2} (ref >59)
GFR calc non Af Amer: 52 mL/min/{1.73_m2} — ABNORMAL LOW (ref >59)
Globulin, Total: 2.6 g/dL (ref 1.5–4.5)
Glucose: 86 mg/dL (ref 65–99)
Potassium: 4.7 mmol/L (ref 3.5–5.2)
Sodium: 141 mmol/L (ref 134–144)
Total Protein: 6.9 g/dL (ref 6.0–8.5)

## 2019-02-13 ENCOUNTER — Telehealth: Payer: Self-pay | Admitting: *Deleted

## 2019-02-13 NOTE — Telephone Encounter (Signed)
Call placed to the daughter in law per the patient's permission. The daughter in law stated that she did not remember that the patient was ever on a cholesterol medication.  She did state that the patient's PCP is Dr. Kathyrn Lass which has been updated in the patient's chart.

## 2019-02-13 NOTE — Telephone Encounter (Signed)
-----   Message from Sanda Klein, MD sent at 02/12/2019  4:42 PM EDT ----- No clot in the deep veins to explain swelling.  As expected, there is evidence of superficial varicose veins which are the reason for the swelling.  Incidentally, 1 of the 3 arterial vessels that feed the calf and foot is occluded, but there is normal flow through the other 2. Should continue aspirin. We should discuss whether or not she should go back on cholesterol lowering meds, since there is evidence of atherosclerosis (hard to find any solid data on the value of that in a 85 year old). Judging by labs, she was taking a statin in 2011, no longer taking since 2016. Does she remember why it was stopped ?

## 2019-02-13 NOTE — Telephone Encounter (Signed)
Patient made aware of results and verbalized understanding.  The patient stated that she did not remember ever being on a cholesterol medication.  Left a message with the daughter in law to call back for possible clarification.

## 2019-02-14 DIAGNOSIS — N993 Prolapse of vaginal vault after hysterectomy: Secondary | ICD-10-CM | POA: Diagnosis not present

## 2019-02-14 DIAGNOSIS — R300 Dysuria: Secondary | ICD-10-CM | POA: Diagnosis not present

## 2019-02-14 DIAGNOSIS — Z4689 Encounter for fitting and adjustment of other specified devices: Secondary | ICD-10-CM | POA: Diagnosis not present

## 2019-02-15 NOTE — Telephone Encounter (Signed)
Patient has been made aware that per Dr. Sallyanne Kuster there will be no changes at this moment.

## 2019-02-19 ENCOUNTER — Ambulatory Visit (INDEPENDENT_AMBULATORY_CARE_PROVIDER_SITE_OTHER): Payer: Medicare HMO | Admitting: *Deleted

## 2019-02-19 DIAGNOSIS — I495 Sick sinus syndrome: Secondary | ICD-10-CM

## 2019-02-19 LAB — CUP PACEART REMOTE DEVICE CHECK
Battery Voltage: 2.96 V
Brady Statistic AP VP Percent: 0.09 %
Brady Statistic AP VS Percent: 96.83 %
Brady Statistic AS VP Percent: 0 %
Brady Statistic AS VS Percent: 3.08 %
Brady Statistic RA Percent Paced: 96.65 %
Brady Statistic RV Percent Paced: 0.11 %
Date Time Interrogation Session: 20200728143411
Implantable Lead Implant Date: 20130729
Implantable Lead Implant Date: 20130729
Implantable Lead Location: 753859
Implantable Lead Location: 753860
Implantable Pulse Generator Implant Date: 20130729
Lead Channel Impedance Value: 448 Ohm
Lead Channel Impedance Value: 528 Ohm
Lead Channel Sensing Intrinsic Amplitude: 11.939 mV
Lead Channel Sensing Intrinsic Amplitude: 2.217 mV
Lead Channel Setting Pacing Amplitude: 2 V
Lead Channel Setting Pacing Amplitude: 2.5 V
Lead Channel Setting Pacing Pulse Width: 0.4 ms
Lead Channel Setting Sensing Sensitivity: 0.9 mV

## 2019-02-19 NOTE — Telephone Encounter (Signed)
Transmission received.

## 2019-02-27 ENCOUNTER — Telehealth: Payer: Self-pay | Admitting: Cardiovascular Disease

## 2019-02-27 NOTE — Telephone Encounter (Signed)
Routed to MD and RN

## 2019-02-27 NOTE — Telephone Encounter (Signed)
New Message   Patient states that Dr C requested her blood pressure readings at the beginning of August. Patient states that she is still getting a little swelling around her ankles, but it is better than when she was last seen.    Blood Pressure Readings:  02/23/19- sitting 127/72 hr64  Standing 111/67 hr63  02/24/19- sitting 123/80 hr65  Standing 93/65 hr71  02/25/19- sitting 118/74 hr64  Standing 99/68 hr64  02/26/19- sitting 124/72 hr62  Standing 114/69 hr62  02/27/19-sitting 129/69 hr 63  Standing 96/66 hr71

## 2019-02-27 NOTE — Telephone Encounter (Signed)
Patient called with MD advice. She voiced understanding. Med list updated.

## 2019-02-27 NOTE — Telephone Encounter (Signed)
Blood pressure is much lower than it was in clinic and is actually "too good".  Obviously she drops her systolic blood pressure 32-99 points every time she stands up.  I recommend discontinuing the losartan.  Get up gradually, change positions slowly and carefully.  Wear compression stockings.

## 2019-03-04 ENCOUNTER — Encounter: Payer: Self-pay | Admitting: Cardiology

## 2019-03-04 NOTE — Progress Notes (Signed)
Remote pacemaker transmission.   

## 2019-03-11 DIAGNOSIS — N816 Rectocele: Secondary | ICD-10-CM | POA: Diagnosis not present

## 2019-03-11 DIAGNOSIS — N815 Vaginal enterocele: Secondary | ICD-10-CM | POA: Diagnosis not present

## 2019-03-14 DIAGNOSIS — R5381 Other malaise: Secondary | ICD-10-CM | POA: Diagnosis not present

## 2019-03-14 DIAGNOSIS — E785 Hyperlipidemia, unspecified: Secondary | ICD-10-CM | POA: Diagnosis not present

## 2019-03-14 DIAGNOSIS — N183 Chronic kidney disease, stage 3 (moderate): Secondary | ICD-10-CM | POA: Diagnosis not present

## 2019-03-14 DIAGNOSIS — E039 Hypothyroidism, unspecified: Secondary | ICD-10-CM | POA: Diagnosis not present

## 2019-03-14 DIAGNOSIS — G479 Sleep disorder, unspecified: Secondary | ICD-10-CM | POA: Diagnosis not present

## 2019-04-19 ENCOUNTER — Telehealth: Payer: Self-pay | Admitting: Cardiovascular Disease

## 2019-04-19 NOTE — Telephone Encounter (Signed)
Please call patient to confirm all of the medications she should still be on. She wants to make sure she is taking all of the correct medicines

## 2019-04-19 NOTE — Telephone Encounter (Signed)
Spoke with patient and reviewed medication list. She was advised to call in October to schedule a January 2021 6 month visit

## 2019-04-19 NOTE — Telephone Encounter (Signed)
LMTCB

## 2019-05-21 ENCOUNTER — Ambulatory Visit (INDEPENDENT_AMBULATORY_CARE_PROVIDER_SITE_OTHER): Payer: Medicare HMO | Admitting: *Deleted

## 2019-05-21 DIAGNOSIS — I495 Sick sinus syndrome: Secondary | ICD-10-CM

## 2019-05-21 DIAGNOSIS — I471 Supraventricular tachycardia: Secondary | ICD-10-CM

## 2019-05-22 LAB — CUP PACEART REMOTE DEVICE CHECK
Battery Voltage: 2.96 V
Brady Statistic AP VP Percent: 0.06 %
Brady Statistic AP VS Percent: 81.5 %
Brady Statistic AS VP Percent: 0 %
Brady Statistic AS VS Percent: 18.44 %
Brady Statistic RA Percent Paced: 81.27 %
Brady Statistic RV Percent Paced: 0.08 %
Date Time Interrogation Session: 20201027135653
Implantable Lead Implant Date: 20130729
Implantable Lead Implant Date: 20130729
Implantable Lead Location: 753859
Implantable Lead Location: 753860
Implantable Pulse Generator Implant Date: 20130729
Lead Channel Impedance Value: 432 Ohm
Lead Channel Impedance Value: 608 Ohm
Lead Channel Sensing Intrinsic Amplitude: 12.621 mV
Lead Channel Sensing Intrinsic Amplitude: 2.217 mV
Lead Channel Setting Pacing Amplitude: 2 V
Lead Channel Setting Pacing Amplitude: 2.5 V
Lead Channel Setting Pacing Pulse Width: 0.4 ms
Lead Channel Setting Sensing Sensitivity: 0.9 mV

## 2019-05-27 DIAGNOSIS — N8111 Cystocele, midline: Secondary | ICD-10-CM | POA: Diagnosis not present

## 2019-05-27 DIAGNOSIS — N993 Prolapse of vaginal vault after hysterectomy: Secondary | ICD-10-CM | POA: Diagnosis not present

## 2019-06-03 DIAGNOSIS — S51802A Unspecified open wound of left forearm, initial encounter: Secondary | ICD-10-CM | POA: Diagnosis not present

## 2019-06-11 NOTE — Progress Notes (Signed)
Remote pacemaker transmission.   

## 2019-06-14 DIAGNOSIS — I10 Essential (primary) hypertension: Secondary | ICD-10-CM | POA: Diagnosis not present

## 2019-06-14 DIAGNOSIS — Z09 Encounter for follow-up examination after completed treatment for conditions other than malignant neoplasm: Secondary | ICD-10-CM | POA: Diagnosis not present

## 2019-06-14 DIAGNOSIS — R609 Edema, unspecified: Secondary | ICD-10-CM | POA: Diagnosis not present

## 2019-08-01 DIAGNOSIS — I129 Hypertensive chronic kidney disease with stage 1 through stage 4 chronic kidney disease, or unspecified chronic kidney disease: Secondary | ICD-10-CM | POA: Diagnosis not present

## 2019-08-01 DIAGNOSIS — N183 Chronic kidney disease, stage 3 unspecified: Secondary | ICD-10-CM | POA: Diagnosis not present

## 2019-08-01 DIAGNOSIS — E039 Hypothyroidism, unspecified: Secondary | ICD-10-CM | POA: Diagnosis not present

## 2019-08-01 DIAGNOSIS — E785 Hyperlipidemia, unspecified: Secondary | ICD-10-CM | POA: Diagnosis not present

## 2019-08-01 DIAGNOSIS — I10 Essential (primary) hypertension: Secondary | ICD-10-CM | POA: Diagnosis not present

## 2019-08-19 ENCOUNTER — Encounter (INDEPENDENT_AMBULATORY_CARE_PROVIDER_SITE_OTHER): Payer: Self-pay

## 2019-08-19 ENCOUNTER — Encounter: Payer: Self-pay | Admitting: Cardiovascular Disease

## 2019-08-19 ENCOUNTER — Other Ambulatory Visit: Payer: Self-pay

## 2019-08-19 ENCOUNTER — Ambulatory Visit (INDEPENDENT_AMBULATORY_CARE_PROVIDER_SITE_OTHER): Payer: Medicare HMO | Admitting: Cardiovascular Disease

## 2019-08-19 VITALS — BP 180/80 | HR 62 | Ht 63.0 in | Wt 122.0 lb

## 2019-08-19 DIAGNOSIS — I739 Peripheral vascular disease, unspecified: Secondary | ICD-10-CM

## 2019-08-19 DIAGNOSIS — E782 Mixed hyperlipidemia: Secondary | ICD-10-CM

## 2019-08-19 DIAGNOSIS — I471 Supraventricular tachycardia: Secondary | ICD-10-CM

## 2019-08-19 DIAGNOSIS — I951 Orthostatic hypotension: Secondary | ICD-10-CM

## 2019-08-19 DIAGNOSIS — I495 Sick sinus syndrome: Secondary | ICD-10-CM

## 2019-08-19 DIAGNOSIS — Z950 Presence of cardiac pacemaker: Secondary | ICD-10-CM | POA: Diagnosis not present

## 2019-08-19 DIAGNOSIS — I872 Venous insufficiency (chronic) (peripheral): Secondary | ICD-10-CM

## 2019-08-19 DIAGNOSIS — I10 Essential (primary) hypertension: Secondary | ICD-10-CM

## 2019-08-19 MED ORDER — CILOSTAZOL 50 MG PO TABS: 50.0000 mg | ORAL_TABLET | Freq: Two times a day (BID) | ORAL | 5 refills | Status: DC

## 2019-08-19 MED ORDER — ATORVASTATIN CALCIUM 10 MG PO TABS: 10.0000 mg | ORAL_TABLET | Freq: Every day | ORAL | 5 refills | Status: DC

## 2019-08-19 NOTE — Patient Instructions (Signed)
Medication Instructions:  START Cilostazol (Pletal) 50 mg twice daily START Atorvastatin 10 mg once daily  *If you need a refill on your cardiac medications before your next appointment, please call your pharmacy*  Lab Work: Your provider would like for you to return in 3 months to have the following labs drawn: fasting lipid. You do not need an appointment for the lab. Once in our office lobby there is a podium where you can sign in and ring the doorbell to alert [place] that you are here. The lab is open from 8:00 am to 4:30 pm; closed for lunch from 12:45pm-1:45pm.  If you have labs (blood work) drawn today and your tests are completely normal, you will receive your results only by: Marland Kitchen MyChart Message (if you have MyChart) OR . A paper copy in the mail If you have any lab test that is abnormal or we need to change your treatment, we will call you to review the results.  Testing/Procedures: None ordered  Follow-Up: At [facility], you and your health needs are our priority.  As part of our continuing mission to provide you with exceptional heart care, we have created designated Provider Care Teams.  These Care Teams include your primary Cardiologist (physician) and Advanced Practice Providers (APPs -  Physician Assistants and Nurse Practitioners) who all work together to provide you with the care you need, when you need it.  Your next appointment:   6 month(s)  The format for your next appointment:   In Person  Provider:   Sanda Klein, MD

## 2019-08-19 NOTE — Progress Notes (Signed)
Patient ID: Katelyn Morris, female   DOB: Jan 16, 1923, 86 y.o.   MRN: [id number]    Cardiology Office Note    Date:  08/20/2019   ID:  Katelyn Morris, [name] [birth year], MRN [id number]  PCP:  Kathyrn Lass, MD  Cardiologist:   Sanda Klein, MD   Chief Complaint  Patient presents with  . Leg Pain  Pacemaker check  History of Present Illness:  Katelyn Morris is a 86 y.o. female with history of sinus node dysfunction and pacemaker implantation (Medtronic Revo MRI conditional 2013), paroxysmal atrial tachycardia, essential hypertension, varicose veins who returns for a pacemaker check and reports left calf intermittent claudication.   She reports left calf cramping and weakness if she walks to the mailbox, but not during household activities. She still lives independently. Denies dyspnea, but would like to have more energy. No angina. No syncope. Markedly fewer palpitations compared to last year. Fewer symptoms of orthostatic hypotension, but these still occasionally occur.  Edema has improved with compression stockings.   The venous US showed absence of DVT and incidentally documented occlusion of the L posterior tibial (biphasic flow in the other two infrapopliteal branches.  She has kept detailed BP logs at home and usually has 120-130/70s. HR usually in low 60s. Her Medtronic Revo device was implanted in 2013. Device function is normal.  Battery voltage is 2.95 V (RRT 2.81 V).  Both the atrial and ventricular leads have normal parameters.  As before, she has about 80% atrial pacing and never has ventricular pacing. The heart rate histogram is very blunted. She has occasional atrial tachycardia with 1:1 AV conduction, probably ectopic atrial tachycardia.  The overall burden is very low, less than 0.1% of the time.   Past attempts to perform manual testing of the ventricular pacing threshold repeatedly led to induction of brief nonsustained atrial tachycardia, suggesting AV node-dependent  mechanism, but there is no clear evidence of long-short sequence initiation and the A and V electrograms are not superimposed.  She has normal left ventricular systolic function and aortic valve sclerosis explaining her systolic murmur. Her right carotid bruit was not associated with any significant obstruction by ultrasonography. A remote nuclear stress test did not show perfusion abnormalities.     Past Medical History:  Diagnosis Date  . Arthritis    in knees per patient  . Dysrhythmia    Bradycardia  . Heart murmur   . Hypertension   . Hypothyroidism   . Pacemaker 02/20/2012   dual chamber  . SSS (sick sinus syndrome) ([place]) 08/06/2014    Past Surgical History:  Procedure Laterality Date  . ABDOMINAL HYSTERECTOMY    . bladder tack    . INSERT / REPLACE / REMOVE PACEMAKER    . PERMANENT PACEMAKER INSERTION N/A 02/20/2012   Procedure: PERMANENT PACEMAKER INSERTION;  Surgeon: Sanda Klein, MD;  Location: [facility];  Service: Cardiovascular;  Laterality: N/A;  . ROTATOR CUFF REPAIR     bilateral    Outpatient Medications Prior to Visit  Medication Sig Dispense Refill  . ALPRAZolam (XANAX) 0.5 MG tablet Take 0.25 mg by mouth at bedtime as needed.    Marland Kitchen aspirin 81 MG tablet Take 81 mg by mouth daily.    . Cholecalciferol (VITAMIN D3 PO) Take 1 capsule by mouth daily.    . hydrochlorothiazide (HYDRODIURIL) 12.5 MG tablet Take 12.5 mg by mouth daily.    Marland Kitchen levothyroxine (SYNTHROID, LEVOTHROID) 50 MCG tablet Take 50 mcg by mouth daily.    Marland Kitchen  metoprolol succinate (TOPROL-XL) 50 MG 24 hr tablet Take 50 mg by mouth daily. Take with or immediately following a meal.    . metoprolol succinate (TOPROL-XL) 25 MG 24 hr tablet Take 1 tablet (25 mg total) by mouth daily. (Patient not taking: Reported on 08/19/2019) 90 tablet 3   No facility-administered medications prior to visit.     Allergies:   Sulfa antibiotics and Sulfonamide derivatives   Social History   Socioeconomic History  .  Marital status: Widowed    Spouse name: Not on file  . Number of children: Not on file  . Years of education: Not on file  . Highest education level: Not on file  Occupational History  . Not on file  Tobacco Use  . Smoking status: Never Smoker  . Smokeless tobacco: Never Used  Substance and Sexual Activity  . Alcohol use: No  . Drug use: No  . Sexual activity: Never    Birth control/protection: Post-menopausal  Other Topics Concern  . Not on file  Social History Narrative  . Not on file   Social Determinants of Health   Financial Resource Strain:   . Difficulty of Paying Living Expenses: Not on file  Food Insecurity:   . Worried About Charity fundraiser in the Last Year: Not on file  . Ran Out of Food in the Last Year: Not on file  Transportation Needs:   . Lack of Transportation (Medical): Not on file  . Lack of Transportation (Non-Medical): Not on file  Physical Activity:   . Days of Exercise per Week: Not on file  . Minutes of Exercise per Session: Not on file  Stress:   . Feeling of Stress : Not on file  Social Connections:   . Frequency of Communication with Friends and Family: Not on file  . Frequency of Social Gatherings with Friends and Family: Not on file  . Attends Religious Services: Not on file  . Active Member of Clubs or Organizations: Not on file  . Attends Archivist Meetings: Not on file  . Marital Status: Not on file      ROS:   Please see the history of present illness.    All other systems are reviewed and are negative.  PHYSICAL EXAM:   VS:  BP (!) 180/80   Pulse 62   Ht 5\' 3"  (1.6 m)   Wt 122 lb (55.3 kg)   BMI 21.61 kg/m    Recheck BP 135/75 mmHg General: Alert, oriented x3, no distress, healthy L subclavian pacer site Head: no evidence of trauma, PERRL, EOMI, no exophtalmos or lid lag, no myxedema, no xanthelasma; normal ears, nose and oropharynx Neck: normal jugular venous pulsations and no hepatojugular reflux; brisk  carotid pulses without delay and no carotid bruits Chest: clear to auscultation, no signs of consolidation by percussion or palpation, normal fremitus, symmetrical and full respiratory excursions Cardiovascular: normal position and quality of the apical impulse, regular rhythm, normal first and second heart sounds, no murmurs, rubs or gallops Abdomen: no tenderness or distention, no masses by palpation, no abnormal pulsatility or arterial bruits, normal bowel sounds, no hepatosplenomegaly Extremities: no clubbing, cyanosis or edema; 2+ radial, ulnar and brachial pulses bilaterally; 2+ right femoral, posterior tibial and dorsalis pedis pulses; 2+ left femoral, posterior tibial and dorsalis pedis pulses; no subclavian or femoral bruits Neurological: grossly nonfocal Psych: Normal mood and affect    Wt Readings from Last 3 Encounters:  08/19/19 122 lb (55.3 kg)  02/11/19  123 lb 6.4 oz (56 kg)  08/24/18 127 lb (57.6 kg)      Studies/Labs Reviewed:   EKG: An electrocardiogram was ordered today and shows atrial paced, ventricular sensed rhythm with prolonged AV delay 234 ms, QS pattern in leads V1 V2 and T wave inversion V4 V5, similar with previous tracings.  QTc 438 ms. Lipid Panel    Component Value Date/Time   CHOL 208 (H) 08/05/2014 0922   TRIG 202 (H) 08/05/2014 0922   HDL 45 08/05/2014 0922   CHOLHDL 4.6 08/05/2014 0922   VLDL 40 08/05/2014 0922   LDLCALC 123 (H) 08/05/2014 0922    03/14/2019 total cholesterol 215, LDL 137, HDL 39, triglycerides 196  Hemoglobin 13.2, creatinine 0.94, potassium 4.7  ASSESSMENT:    1. PAD (peripheral artery disease) ([place])   2. SSS (sick sinus syndrome) ([place])   3. Mixed hyperlipidemia   4. SVT (supraventricular tachycardia) ([place])   5. Pacemaker - Medtronic Revo, dual chamber 2013   6. Essential hypertension   7. Orthostatic hypotension   8. Peripheral venous insufficiency      PLAN:  In order of problems listed above:  1. PAD: She has  symptoms of intermittent left calf claudication and documented occlusion of the posterior tibial artery.  Suggested we could perform more comprehensive evaluation of circulation in her legs, but she is reluctant to undergo any type of invasive angiography or intervention so this may be a moot point.  Added cilostazol and will reevaluate symptom response in a couple of months.   2. SVT: Symptomatically improved after we added metoprolol.  Unsure whether this is AV node reentry or ectopic atrial tachycardia.  No additional medication changes made today. 3. SSS: Initial presentation was with sinus bradycardia and near syncope.  After we add a beta-blocker she seems to have also developed evidence of chronotropic incompetence.  Pacemaker settings adjusted (slope increased from "8" to "9"). 4. PM: Not device dependent.  Normal device function.  Unable to get remote downloads.  We will schedule for every 3-6 month checks in clinic. 5. HTN: Blood pressure was little high when she first checked in, but her home recordings consistently show excellent blood pressure control.  Note that she had symptoms of orthostatic hypotension and we documented a 35 mmHg drop in blood pressure on one of her visits.  Improved after we reduced her antihypertensive medications.. 6. Orthostatic hypotension:  She did not tolerate complete discontinuation of her diuretic so we have added it back. Need to tolerate systolic blood pressure in the 150-160 range since she has substantial orthostatic hypotension. 7. HLP: With the development of symptomatic PAD it appears there is a more compelling reason to treat her moderate hypercholesterolemia (total cholesterol 215, LDL 137, HDL 39, triglycerides 196).  Added atorvastatin today.  Recheck lipid profile in 3 months. 8. Leg edema: Improved with compression stockings.    Medication Adjustments/Labs and Tests Ordered: Current medicines are reviewed at length with the patient today.  Concerns  regarding medicines are outlined above.  Medication changes, Labs and Tests ordered today are listed in the Patient Instructions below. Patient Instructions  Medication Instructions:  START Cilostazol (Pletal) 50 mg twice daily START Atorvastatin 10 mg once daily  *If you need a refill on your cardiac medications before your next appointment, please call your pharmacy*  Lab Work: Your provider would like for you to return in 3 months to have the following labs drawn: fasting lipid. You do not need an appointment for the  lab. Once in our office lobby there is a podium where you can sign in and ring the doorbell to alert [place] that you are here. The lab is open from 8:00 am to 4:30 pm; closed for lunch from 12:45pm-1:45pm.  If you have labs (blood work) drawn today and your tests are completely normal, you will receive your results only by: Marland Kitchen MyChart Message (if you have MyChart) OR . A paper copy in the mail If you have any lab test that is abnormal or we need to change your treatment, we will call you to review the results.  Testing/Procedures: None ordered  Follow-Up: At [facility], you and your health needs are our priority.  As part of our continuing mission to provide you with exceptional heart care, we have created designated Provider Care Teams.  These Care Teams include your primary Cardiologist (physician) and Advanced Practice Providers (APPs -  Physician Assistants and Nurse Practitioners) who all work together to provide you with the care you need, when you need it.  Your next appointment:   6 month(s)  The format for your next appointment:   In Person  Provider:   Sanda Klein, MD  Signed, Sanda Klein, MD  08/20/2019 4:23 PM    [facility] [address], [place], [place]  [address] Phone: [phone]; Fax: [phone]

## 2019-08-20 ENCOUNTER — Ambulatory Visit (INDEPENDENT_AMBULATORY_CARE_PROVIDER_SITE_OTHER): Payer: Medicare HMO | Admitting: *Deleted

## 2019-08-20 ENCOUNTER — Encounter: Payer: Self-pay | Admitting: Cardiovascular Disease

## 2019-08-20 DIAGNOSIS — I495 Sick sinus syndrome: Secondary | ICD-10-CM

## 2019-08-20 LAB — CUP PACEART REMOTE DEVICE CHECK
Battery Voltage: 2.95 V
Brady Statistic AP VP Percent: 0.09 %
Brady Statistic AP VS Percent: 99.7 %
Brady Statistic AS VP Percent: 0.02 %
Brady Statistic AS VS Percent: 0.19 %
Brady Statistic RA Percent Paced: 99.72 %
Brady Statistic RV Percent Paced: 0.13 %
Date Time Interrogation Session: 20210126074337
Implantable Lead Implant Date: 20130729
Implantable Lead Implant Date: 20130729
Implantable Lead Location: 753859
Implantable Lead Location: 753860
Implantable Pulse Generator Implant Date: 20130729
Lead Channel Impedance Value: 432 Ohm
Lead Channel Impedance Value: 464 Ohm
Lead Channel Sensing Intrinsic Amplitude: 12.28 mV
Lead Channel Sensing Intrinsic Amplitude: 2.087 mV
Lead Channel Setting Pacing Amplitude: 2 V
Lead Channel Setting Pacing Amplitude: 2.5 V
Lead Channel Setting Pacing Pulse Width: 0.4 ms
Lead Channel Setting Sensing Sensitivity: 0.9 mV

## 2019-08-23 ENCOUNTER — Other Ambulatory Visit: Payer: Self-pay | Admitting: Cardiovascular Disease

## 2019-09-27 DIAGNOSIS — N183 Chronic kidney disease, stage 3 unspecified: Secondary | ICD-10-CM | POA: Diagnosis not present

## 2019-09-27 DIAGNOSIS — I10 Essential (primary) hypertension: Secondary | ICD-10-CM | POA: Diagnosis not present

## 2019-09-27 DIAGNOSIS — I129 Hypertensive chronic kidney disease with stage 1 through stage 4 chronic kidney disease, or unspecified chronic kidney disease: Secondary | ICD-10-CM | POA: Diagnosis not present

## 2019-09-27 DIAGNOSIS — E039 Hypothyroidism, unspecified: Secondary | ICD-10-CM | POA: Diagnosis not present

## 2019-09-27 DIAGNOSIS — E785 Hyperlipidemia, unspecified: Secondary | ICD-10-CM | POA: Diagnosis not present

## 2019-10-21 ENCOUNTER — Other Ambulatory Visit: Payer: Self-pay | Admitting: *Deleted

## 2019-10-21 DIAGNOSIS — N993 Prolapse of vaginal vault after hysterectomy: Secondary | ICD-10-CM | POA: Diagnosis not present

## 2019-10-21 DIAGNOSIS — N815 Vaginal enterocele: Secondary | ICD-10-CM | POA: Diagnosis not present

## 2019-10-21 MED ORDER — HYDROCHLOROTHIAZIDE 12.5 MG PO TABS: 12.5000 mg | ORAL_TABLET | Freq: Every day | ORAL | 1 refills | Status: DC

## 2019-10-21 NOTE — Telephone Encounter (Signed)
Rx has been sent to the pharmacy electronically. ° °

## 2019-10-23 ENCOUNTER — Telehealth: Payer: Self-pay | Admitting: Cardiovascular Disease

## 2019-10-23 MED ORDER — HYDROCHLOROTHIAZIDE 12.5 MG PO TABS: 12.5000 mg | ORAL_TABLET | Freq: Every day | ORAL | 1 refills | Status: DC

## 2019-10-23 NOTE — Telephone Encounter (Signed)
*  STAT* If patient is at the pharmacy, call can be transferred to refill team.   1. Which medications need to be refilled? (please list name of each medication and dose if known) hydrochlorothiazide (HYDRODIURIL) 12.5 MG tablet  2. Which pharmacy/location (including street and city if local pharmacy) is medication to be sent to? [id number] - [place], [place] [address]   3. Do they need a 30 day or 90 day supply? 90 day  Patient is out of medication

## 2019-10-23 NOTE — Telephone Encounter (Signed)
HCTZ refill sent to preferred pharmacy.

## 2019-10-25 ENCOUNTER — Other Ambulatory Visit: Payer: Self-pay

## 2019-10-25 MED ORDER — HYDROCHLOROTHIAZIDE 12.5 MG PO TABS: 12.5000 mg | ORAL_TABLET | Freq: Every day | ORAL | 3 refills | Status: DC

## 2019-10-28 ENCOUNTER — Telehealth: Payer: Self-pay | Admitting: Cardiovascular Disease

## 2019-10-28 DIAGNOSIS — E039 Hypothyroidism, unspecified: Secondary | ICD-10-CM

## 2019-10-28 DIAGNOSIS — I10 Essential (primary) hypertension: Secondary | ICD-10-CM

## 2019-10-28 NOTE — Telephone Encounter (Signed)
The daughter has been made aware that orders have been placed for the CMET and TSH. She will have the patient come into the office tomorrow for labs including the fasting lipid.

## 2019-10-28 NOTE — Telephone Encounter (Signed)
Patient's daughter in law is calling to see if Dr. Sallyanne Kuster can put in an order for TSH lab order to check the patients thyroid functioning.

## 2019-10-28 NOTE — Telephone Encounter (Signed)
Please advise if okay to check lab work?   Thank you!

## 2019-10-29 DIAGNOSIS — E039 Hypothyroidism, unspecified: Secondary | ICD-10-CM | POA: Diagnosis not present

## 2019-10-29 DIAGNOSIS — I10 Essential (primary) hypertension: Secondary | ICD-10-CM | POA: Diagnosis not present

## 2019-10-29 DIAGNOSIS — E785 Hyperlipidemia, unspecified: Secondary | ICD-10-CM | POA: Diagnosis not present

## 2019-10-29 LAB — LIPID PANEL
Chol/HDL Ratio: 2.2 ratio (ref 0.0–4.4)
Cholesterol, Total: 121 mg/dL (ref 100–199)
HDL: 54 mg/dL (ref >39)
LDL Chol Calc (NIH): 52 mg/dL (ref 0–99)
Triglycerides: 73 mg/dL (ref 0–149)
VLDL Cholesterol Cal: 15 mg/dL (ref 5–40)

## 2019-10-29 LAB — COMPREHENSIVE METABOLIC PANEL
ALT: 15 IU/L (ref 0–32)
AST: 22 IU/L (ref 0–40)
Albumin/Globulin Ratio: 1.8 (ref 1.2–2.2)
Albumin: 4.2 g/dL (ref 3.5–4.6)
Alkaline Phosphatase: 66 IU/L (ref 39–117)
BUN/Creatinine Ratio: 19 (ref 12–28)
BUN: 20 mg/dL (ref 10–36)
Bilirubin Total: 0.6 mg/dL (ref 0.0–1.2)
CO2: 25 mmol/L (ref 20–29)
Calcium: 9.7 mg/dL (ref 8.7–10.3)
Chloride: 106 mmol/L (ref 96–106)
Creatinine, Ser: 1.05 mg/dL — ABNORMAL HIGH (ref 0.57–1.00)
GFR calc Af Amer: 52 mL/min/{1.73_m2} — ABNORMAL LOW (ref >59)
GFR calc non Af Amer: 45 mL/min/{1.73_m2} — ABNORMAL LOW (ref >59)
Globulin, Total: 2.4 g/dL (ref 1.5–4.5)
Glucose: 90 mg/dL (ref 65–99)
Potassium: 4.6 mmol/L (ref 3.5–5.2)
Sodium: 144 mmol/L (ref 134–144)
Total Protein: 6.6 g/dL (ref 6.0–8.5)

## 2019-10-29 LAB — TSH: TSH: 1.39 u[IU]/mL (ref 0.450–4.500)

## 2019-11-01 ENCOUNTER — Encounter: Payer: Self-pay | Admitting: *Deleted

## 2019-11-01 ENCOUNTER — Telehealth: Payer: Self-pay | Admitting: *Deleted

## 2019-11-01 NOTE — Telephone Encounter (Signed)
The daughter has been advised of the patient's lab results which were all normal.  She has been concerned that the patient is not as active as she used to be which is why she requested the labs. The patient is asymptomatic, no shortness of breath or chest pain, but does want to take frequent naps.   The daughter would like to know if the Pletal can cause the patient to have this symptom.

## 2019-11-01 NOTE — Telephone Encounter (Signed)
That is not a side effect of Pletal that I have ever heard of.

## 2019-11-01 NOTE — Telephone Encounter (Signed)
The patient's daughter has been made aware.

## 2019-11-01 NOTE — Telephone Encounter (Signed)
Appointment made for 4/20 with Dr. Sallyanne Kuster. The daughter stated that the patient is concerned about her right leg. She stated that occassionally her right leg will have some swelling. She denies any pain, redness or discoloration.   She has been advised to call back if the swelling comes back and worsens, pain develops or any discoloration occurs.

## 2019-11-12 ENCOUNTER — Encounter: Payer: Self-pay | Admitting: Cardiovascular Disease

## 2019-11-12 ENCOUNTER — Other Ambulatory Visit: Payer: Self-pay

## 2019-11-12 ENCOUNTER — Ambulatory Visit (INDEPENDENT_AMBULATORY_CARE_PROVIDER_SITE_OTHER): Payer: Medicare HMO | Admitting: Cardiovascular Disease

## 2019-11-12 VITALS — BP 160/71 | HR 67 | Temp 97.3°F | Resp 20 | Ht 63.0 in | Wt 122.0 lb

## 2019-11-12 DIAGNOSIS — I10 Essential (primary) hypertension: Secondary | ICD-10-CM

## 2019-11-12 DIAGNOSIS — Z950 Presence of cardiac pacemaker: Secondary | ICD-10-CM | POA: Diagnosis not present

## 2019-11-12 DIAGNOSIS — E782 Mixed hyperlipidemia: Secondary | ICD-10-CM | POA: Diagnosis not present

## 2019-11-12 DIAGNOSIS — I951 Orthostatic hypotension: Secondary | ICD-10-CM

## 2019-11-12 DIAGNOSIS — I739 Peripheral vascular disease, unspecified: Secondary | ICD-10-CM | POA: Diagnosis not present

## 2019-11-12 DIAGNOSIS — I471 Supraventricular tachycardia: Secondary | ICD-10-CM | POA: Diagnosis not present

## 2019-11-12 DIAGNOSIS — R600 Localized edema: Secondary | ICD-10-CM

## 2019-11-12 DIAGNOSIS — I495 Sick sinus syndrome: Secondary | ICD-10-CM | POA: Diagnosis not present

## 2019-11-12 NOTE — Progress Notes (Signed)
Patient ID: Katelyn Morris, female   DOB: 1922/12/27, 86 y.o.   MRN: [id number]    Cardiology Office Note    Date:  11/15/2019   ID:  Katelyn Morris, Katelyn Morris Feb 25, 1923, MRN [id number]  PCP:  Lawerance Cruel, MD  Cardiologist:   Sanda Klein, MD   No chief complaint on file. Pacemaker check  History of Present Illness:  Katelyn Morris is a 86 y.o. female with history of sinus node dysfunction and pacemaker implantation (Medtronic Revo MRI conditional 2013), paroxysmal atrial tachycardia, essential hypertension, mild PAD, varicose veins who returns for a pacemaker check.  Her complaints of left calf intermittent claudication are less prominent than at her last appointment.  She has had a few instances of orthostatic dizziness, less severe than in the past.  With compression stockings she has relatively mild ankle swelling.  She denies orthopnea, PND, exertional dyspnea, angina pectoris at rest or with activity, palpitations, syncope or focal neurological events.  She has documented occlusion of the L posterior tibial (biphasic flow in the other two infrapopliteal branches).  Her blood pressure was quite elevated when she first checked in today, although it had decreased to 160/70 by the end of our interview.  At home her typical blood pressure is in the 130s/70s.  Her Medtronic Revo device was implanted in 2013. Device function is normal.  Her last pacemaker download battery voltage is 2.95 V (RRT 2.81 V).  Lead parameters are normal.  She has virtually 100% atrial pacing and virtually never requires ventricular pacing.  The heart rate histogram distribution is blunted but she is very sedentary.  She has occasional episodes of atrial tachycardia with 1: 1 AV conduction.  Overall burden remains very low at under 0.1%.  As has happened in the past, when I attempted to check ventricular pacing thresholds today, brief bursts of atrial tachycardia were induced, suggesting an AV node  dependent mechanism.  She has normal left ventricular systolic function and aortic valve sclerosis explaining her systolic murmur. Her right carotid bruit was not associated with any significant obstruction by ultrasonography. A remote nuclear stress test did not show perfusion abnormalities.     Past Medical History:  Diagnosis Date  . Arthritis    in knees per patient  . Dysrhythmia    Bradycardia  . Heart murmur   . Hypertension   . Hypothyroidism   . Pacemaker 02/20/2012   dual chamber  . SSS (sick sinus syndrome) ([place]) 08/06/2014    Past Surgical History:  Procedure Laterality Date  . ABDOMINAL HYSTERECTOMY    . bladder tack    . INSERT / REPLACE / REMOVE PACEMAKER    . PERMANENT PACEMAKER INSERTION N/A 02/20/2012   Procedure: PERMANENT PACEMAKER INSERTION;  Surgeon: Sanda Klein, MD;  Location: [facility]ATH [facility];  Service: Cardiovascular;  Laterality: N/A;  . ROTATOR CUFF REPAIR     bilateral    Outpatient Medications Prior to Visit  Medication Sig Dispense Refill  . ALPRAZolam (XANAX) 0.5 MG tablet Take 0.25 mg by mouth at bedtime as needed.    Marland Kitchen aspirin 81 MG tablet Take 81 mg by mouth daily.    Marland Kitchen atorvastatin (LIPITOR) 10 MG tablet Take 1 tablet (10 mg total) by mouth daily. 30 tablet 5  . Cholecalciferol (VITAMIN D3 PO) Take 1 capsule by mouth daily.    . hydrochlorothiazide (HYDRODIURIL) 12.5 MG tablet Take 1 tablet (12.5 mg total) by mouth daily. 90 tablet 3  . levothyroxine (SYNTHROID, LEVOTHROID) 50 MCG  tablet Take 50 mcg by mouth daily.    . metoprolol succinate (TOPROL-XL) 50 MG 24 hr tablet Take 50 mg by mouth daily. Take with or immediately following a meal.    . cilostazol (PLETAL) 50 MG tablet Take 1 tablet (50 mg total) by mouth 2 (two) times daily. 60 tablet 5  . losartan (COZAAR) 25 MG tablet      No facility-administered medications prior to visit.     Allergies:   Sulfa antibiotics and Sulfonamide derivatives   Social History   Socioeconomic History    . Marital status: Widowed    Spouse name: Not on file  . Number of children: Not on file  . Years of education: Not on file  . Highest education level: Not on file  Occupational History  . Not on file  Tobacco Use  . Smoking status: Never Smoker  . Smokeless tobacco: Never Used  Substance and Sexual Activity  . Alcohol use: No  . Drug use: No  . Sexual activity: Never    Birth control/protection: Post-menopausal  Other Topics Concern  . Not on file  Social History Narrative  . Not on file   Social Determinants of Health   Financial Resource Strain:   . Difficulty of Paying Living Expenses:   Food Insecurity:   . Worried About Charity fundraiser in the Last Year:   . Arboriculturist in the Last Year:   Transportation Needs:   . Film/video editor (Medical):   Marland Kitchen Lack of Transportation (Non-Medical):   Physical Activity:   . Days of Exercise per Week:   . Minutes of Exercise per Session:   Stress:   . Feeling of Stress :   Social Connections:   . Frequency of Communication with Friends and Family:   . Frequency of Social Gatherings with Friends and Family:   . Attends Religious Services:   . Active Member of Clubs or Organizations:   . Attends Archivist Meetings:   Marland Kitchen Marital Status:       ROS:   Please see the history of present illness.    All other systems are reviewed and are negative.   PHYSICAL EXAM:   VS:  BP (!) 160/71   Pulse 67   Temp (!) 97.3 F (36.3 C)   Resp 20   Ht 5\' 3"  (1.6 m)   Wt 122 lb (55.3 kg)   SpO2 98%   BMI 21.61 kg/m     General: Alert, oriented x3, no distress, appears younger than stated age, alert and bright.  Healthy left subclavian pacemaker site. Head: no evidence of trauma, PERRL, EOMI, no exophtalmos or lid lag, no myxedema, no xanthelasma; normal ears, nose and oropharynx Neck: normal jugular venous pulsations and no hepatojugular reflux; brisk carotid pulses without delay and no carotid bruits Chest:  clear to auscultation, no signs of consolidation by percussion or palpation, normal fremitus, symmetrical and full respiratory excursions Cardiovascular: normal position and quality of the apical impulse, regular rhythm, normal first and second heart sounds, no murmurs, rubs or gallops Abdomen: no tenderness or distention, no masses by palpation, no abnormal pulsatility or arterial bruits, normal bowel sounds, no hepatosplenomegaly Extremities: No edema, wearing compression stockings Neurological: grossly nonfocal Psych: Normal mood and affect  Wt Readings from Last 3 Encounters:  11/12/19 122 lb (55.3 kg)  08/19/19 122 lb (55.3 kg)  02/11/19 123 lb 6.4 oz (56 kg)    Studies/Labs Reviewed:   EKG: The  intracardiac electrogram shows atrial paced, ventricular sensed rhythm Lipid Panel    Component Value Date/Time   CHOL 121 10/29/2019 1132   TRIG 73 10/29/2019 1132   HDL 54 10/29/2019 1132   CHOLHDL 2.2 10/29/2019 1132   CHOLHDL 4.6 08/05/2014 0922   VLDL 40 08/05/2014 0922   LDLCALC 52 10/29/2019 1132    03/14/2019 total cholesterol 215, LDL 137, HDL 39, triglycerides 196  Hemoglobin 13.2, creatinine 0.94, potassium 4.7  10/29/2019 Total cholesterol 121, HDL 54, triglycerides 73, LDL 60 Creatinine 1.05, potassium 4.6, normal liver function test, TSH 1.39  ASSESSMENT:    1. PAD (peripheral artery disease) (HCC)   2. Paroxysmal SVT (supraventricular tachycardia) (HCC)   3. SSS (sick sinus syndrome) ([place])   4. Pacemaker - Medtronic Revo, dual chamber 2013   5. Essential hypertension   6. Orthostatic hypotension   7. Mixed hyperlipidemia   8. Edema of both legs      PLAN:  In order of problems listed above:  1. PAD: It appears that she has had some symptomatic benefit from the cilostazol.  Continue this as well as risk factor modification.  Invasive evaluation is not indicated at this time. 2. SVT: Episodes are brief and asymptomatic and overall burden is very low.   Unsure whether this is AV node reentry or ectopic atrial tachycardia.  No additional medication changes made today. 3. SSS: Initial presentation was with sinus bradycardia and near syncope.  Heart rate histograms remain blunted, but she is very sedentary and has no complaints of chronotropic incompetence symptoms. 4. PM: Not device dependent.  Normal device function.  Reevaluate in clinic every 6 months unless we can reliably get remote downloads every 3 months. 5. HTN: Fair control today.  Previously documented to have orthostatic hypotension drops of about 35 mmHg.  Tolerate systolic blood pressure in the 150-160 range. 6. Orthostatic hypotension:  She did not tolerate complete discontinuation of her diuretic so we have added it back. Need to tolerate systolic blood pressure in the 150-160 range since she has substantial orthostatic hypotension. 7. HLP: All lipid parameters are in target range, LDL is less than 70. 8. Leg edema: Improved with compression stockings.  These may also help her orthostatic hypotension.    Medication Adjustments/Labs and Tests Ordered: Current medicines are reviewed at length with the patient today.  Concerns regarding medicines are outlined above.  Medication changes, Labs and Tests ordered today are listed in the Patient Instructions below. Patient Instructions  Medication Instructions:  No changes *If you need a refill on your cardiac medications before your next appointment, please call your pharmacy*   Lab Work: None ordered If you have labs (blood work) drawn today and your tests are completely normal, you will receive your results only by: Marland Kitchen MyChart Message (if you have MyChart) OR . A paper copy in the mail If you have any lab test that is abnormal or we need to change your treatment, we will call you to review the results.   Testing/Procedures: None ordered   Follow-Up: At [facility], you and your health needs are our priority.  As part of our  continuing mission to provide you with exceptional heart care, we have created designated Provider Care Teams.  These Care Teams include your primary Cardiologist (physician) and Advanced Practice Providers (APPs -  Physician Assistants and Nurse Practitioners) who all work together to provide you with the care you need, when you need it.  We recommend signing up for the patient  portal called "MyChart".  Sign up information is provided on this After Visit Summary.  MyChart is used to connect with patients for Virtual Visits (Telemedicine).  Patients are able to view lab/test results, encounter notes, upcoming appointments, etc.  Non-urgent messages can be sent to your provider as well.   To learn more about what you can do with MyChart, go to [email or web address].    Your next appointment:   12 month(s)  The format for your next appointment:   In Person  Provider:   You may see Sanda Klein, MD or one of the following Advanced Practice Providers on your designated Care Team:    Almyra Deforest, PA-C  Fabian Sharp, [place] or   Roby Lofts, PA-C    Signed, Sanda Klein, MD  11/15/2019 4:07 PM    [facility] [place], [place], [place]  [address] Phone: [phone]; Fax: [phone]

## 2019-11-12 NOTE — Patient Instructions (Signed)

## 2019-11-15 ENCOUNTER — Encounter: Payer: Self-pay | Admitting: Cardiovascular Disease

## 2019-11-19 ENCOUNTER — Ambulatory Visit (INDEPENDENT_AMBULATORY_CARE_PROVIDER_SITE_OTHER): Payer: Medicare HMO | Admitting: *Deleted

## 2019-11-19 DIAGNOSIS — I495 Sick sinus syndrome: Secondary | ICD-10-CM

## 2019-11-19 LAB — CUP PACEART REMOTE DEVICE CHECK
Battery Voltage: 2.95 V
Brady Statistic AP VP Percent: 0.02 %
Brady Statistic AP VS Percent: 96.1 %
Brady Statistic AS VP Percent: 0 %
Brady Statistic AS VS Percent: 3.88 %
Brady Statistic RA Percent Paced: 95.97 %
Brady Statistic RV Percent Paced: 0.02 %
Date Time Interrogation Session: 20210427070938
Implantable Lead Implant Date: 20130729
Implantable Lead Implant Date: 20130729
Implantable Lead Location: 753859
Implantable Lead Location: 753860
Implantable Pulse Generator Implant Date: 20130729
Lead Channel Impedance Value: 416 Ohm
Lead Channel Impedance Value: 496 Ohm
Lead Channel Sensing Intrinsic Amplitude: 10.916 mV
Lead Channel Sensing Intrinsic Amplitude: 2.521 mV
Lead Channel Setting Pacing Amplitude: 2 V
Lead Channel Setting Pacing Amplitude: 2.5 V
Lead Channel Setting Pacing Pulse Width: 0.4 ms
Lead Channel Setting Sensing Sensitivity: 0.9 mV

## 2019-11-20 NOTE — Progress Notes (Signed)
PPM Remote  

## 2019-12-15 ENCOUNTER — Emergency Department (HOSPITAL_BASED_OUTPATIENT_CLINIC_OR_DEPARTMENT_OTHER): Payer: Medicare HMO

## 2019-12-15 ENCOUNTER — Encounter (HOSPITAL_BASED_OUTPATIENT_CLINIC_OR_DEPARTMENT_OTHER): Payer: Self-pay | Admitting: *Deleted

## 2019-12-15 ENCOUNTER — Other Ambulatory Visit: Payer: Self-pay

## 2019-12-15 ENCOUNTER — Emergency Department (HOSPITAL_BASED_OUTPATIENT_CLINIC_OR_DEPARTMENT_OTHER)
Admission: EM | Admit: 2019-12-15 | Discharge: 2019-12-15 | Disposition: A | Discharge status: Home / Self Care | Payer: Medicare HMO | Attending: Emergency Medicine | Admitting: Emergency Medicine

## 2019-12-15 DIAGNOSIS — Z950 Presence of cardiac pacemaker: Secondary | ICD-10-CM | POA: Diagnosis not present

## 2019-12-15 DIAGNOSIS — Z79899 Other long term (current) drug therapy: Secondary | ICD-10-CM | POA: Diagnosis not present

## 2019-12-15 DIAGNOSIS — I10 Essential (primary) hypertension: Secondary | ICD-10-CM | POA: Insufficient documentation

## 2019-12-15 DIAGNOSIS — Z7982 Long term (current) use of aspirin: Secondary | ICD-10-CM | POA: Insufficient documentation

## 2019-12-15 DIAGNOSIS — R600 Localized edema: Secondary | ICD-10-CM

## 2019-12-15 DIAGNOSIS — E039 Hypothyroidism, unspecified: Secondary | ICD-10-CM | POA: Diagnosis not present

## 2019-12-15 DIAGNOSIS — R2241 Localized swelling, mass and lump, right lower limb: Secondary | ICD-10-CM | POA: Insufficient documentation

## 2019-12-15 NOTE — Discharge Instructions (Addendum)
DVT study was negative.  Recommend elevation, compression socks.  Follow-up with your primary care doctor.

## 2019-12-15 NOTE — ED Provider Notes (Signed)
[facility] EMERGENCY DEPARTMENT Provider Note   CSN: [id number] Arrival date & time: 12/15/19  1800     History Chief Complaint  Patient presents with  . Leg Swelling    Katelyn Morris is a 86 y.o. female.  The history is provided by the patient.  Illness Location:  Right leg Quality:  Swelling Severity:  Mild Onset quality:  Gradual Duration:  1 day Timing:  Constant Progression:  Unchanged Chronicity:  Recurrent Context:  Hx of varicose veins/venous stasis, DVT in left leg but not on blood thinners, Right leg swelling worse today. No trauma, mild pain. Relieved by:  Nothing Worsened by:  Nothing Associated symptoms: no abdominal pain, no chest pain, no cough, no fever, no rash and no shortness of breath        Past Medical History:  Diagnosis Date  . Arthritis    in knees per patient  . Dysrhythmia    Bradycardia  . Heart murmur   . Hypertension   . Hypothyroidism   . Pacemaker 02/20/2012   dual chamber  . SSS (sick sinus syndrome) ([place]) 08/06/2014    Patient Active Problem List   Diagnosis Date Noted  . Orthostatic hypotension 02/11/2019  . Varicose veins of left leg with edema 02/11/2019  . Paroxysmal SVT (supraventricular tachycardia) ([place]) 08/20/2015  . Cardiac murmur 08/06/2014  . Right carotid bruit 08/06/2014  . SSS (sick sinus syndrome) ([place]) 08/06/2014  . Pacemaker - Medtronic Revo, dual chamber 2013 08/05/2013  . Edema of both legs 08/05/2013  . Bradycardia 02/21/2012  . Pre-syncope 02/21/2012  . THYROID DISORDER 08/30/2010  . Essential hypertension 08/30/2010  . DIVERTICULAR DISEASE 08/30/2010  . ABDOMINAL BLOATING 08/30/2010  . TUBULOVILLOUS ADENOMA, COLON, HX OF 08/30/2010    Past Surgical History:  Procedure Laterality Date  . ABDOMINAL HYSTERECTOMY    . bladder tack    . INSERT / REPLACE / REMOVE PACEMAKER    . PERMANENT PACEMAKER INSERTION N/A 02/20/2012   Procedure: PERMANENT PACEMAKER INSERTION;  Surgeon: Sanda Klein, MD;  Location: [facility] CATH [facility];  Service: Cardiovascular;  Laterality: N/A;  . ROTATOR CUFF REPAIR     bilateral     OB History   No obstetric history on file.     No family history on file.  Social History   Tobacco Use  . Smoking status: Never Smoker  . Smokeless tobacco: Never Used  Substance Use Topics  . Alcohol use: No  . Drug use: No    Home Medications Prior to Admission medications   Medication Sig Start Date End Date Taking? Authorizing Provider  ALPRAZolam Duanne Moron) 0.5 MG tablet Take 0.25 mg by mouth at bedtime as needed.    [provider]  aspirin 81 MG tablet Take 81 mg by mouth daily.    [provider]  atorvastatin (LIPITOR) 10 MG tablet Take 1 tablet (10 mg total) by mouth daily. 08/19/19 11/17/19  Croitoru, Mihai, MD  Cholecalciferol (VITAMIN D3 PO) Take 1 capsule by mouth daily.    [provider]  cilostazol (PLETAL) 50 MG tablet Take 50 mg by mouth 2 (two) times daily. 12/15/19   [provider]  hydrochlorothiazide (HYDRODIURIL) 12.5 MG tablet Take 1 tablet (12.5 mg total) by mouth daily. 10/25/19   Croitoru, Mihai, MD  levothyroxine (SYNTHROID, LEVOTHROID) 50 MCG tablet Take 50 mcg by mouth daily.    [provider]  metoprolol succinate (TOPROL-XL) 50 MG 24 hr tablet Take 50 mg by mouth daily. Take with  or immediately following a meal.    [provider]    Allergies    Sulfa antibiotics and Sulfonamide derivatives  Review of Systems   Review of Systems  Constitutional: Negative for fever.  Respiratory: Negative for cough and shortness of breath.   Cardiovascular: Positive for leg swelling. Negative for chest pain.  Gastrointestinal: Negative for abdominal pain.  Musculoskeletal: Negative for joint swelling.  Skin: Negative for rash.    Physical Exam Updated Vital Signs  ED Triage Vitals  Enc Vitals Group     BP 12/15/19 1816 (!) 177/61     Pulse Rate 12/15/19 1816 61     Resp  12/15/19 1816 18     Temp 12/15/19 1816 98.3 F (36.8 C)     Temp Source 12/15/19 1816 Oral     SpO2 12/15/19 1816 98 %     Weight 12/15/19 1811 122 lb (55.3 kg)     Height 12/15/19 1811 5' 3.5" (1.613 m)     Head Circumference --      Peak Flow --      Pain Score 12/15/19 1811 0     Pain Loc --      Pain Edu? --      Excl. in [place]? --     Physical Exam Constitutional:      General: She is not in acute distress.    Appearance: She is not ill-appearing.  Cardiovascular:     Pulses: Normal pulses.     Heart sounds: Normal heart sounds.  Pulmonary:     Effort: Pulmonary effort is normal. No respiratory distress.     Breath sounds: Normal breath sounds. No stridor. No wheezing, rhonchi or rales.  Musculoskeletal:     Right lower leg: Edema (1+ pitting) present.     Left lower leg: Edema (trace) present.  Skin:    Capillary Refill: Capillary refill takes less than 2 seconds.  Neurological:     Mental Status: She is alert.     ED Results / Procedures / Treatments   Labs (all labs ordered are listed, but only abnormal results are displayed) Labs Reviewed - No data to display  EKG None  Radiology US Venous Img Lower Right (DVT Study)  Result Date: 12/15/2019 CLINICAL DATA:  Right lower extremity swelling x2 days. EXAM: RIGHT LOWER EXTREMITY VENOUS DOPPLER ULTRASOUND TECHNIQUE: Gray-scale sonography with compression, as well as color and duplex ultrasound, were performed to evaluate the deep venous system(s) from the level of the common femoral vein through the popliteal and proximal calf veins. COMPARISON:  None. FINDINGS: VENOUS Normal compressibility of the common femoral, superficial femoral, and popliteal veins, as well as the visualized calf veins. Visualized portions of profunda femoral vein and great saphenous vein unremarkable. No filling defects to suggest DVT on grayscale or color Doppler imaging. Doppler waveforms show normal direction of venous flow, normal respiratory  plasticity and response to augmentation. Limited views of the contralateral common femoral vein are unremarkable. OTHER Superficial soft tissue edema is seen within the right calf. Limitations: none IMPRESSION: Superficial right calf edema without evidence of a right lower extremity DVT. Electronically Signed   By: Virgina Norfolk M.D.   On: 12/15/2019 19:56    Procedures Procedures (including critical care time)  Medications Ordered in ED Medications - No data to display  ED Course  I have reviewed the triage vital signs and the nursing notes.  Pertinent labs & imaging results that were available during my care of the patient  were reviewed by me and considered in my medical decision making (see chart for details).    MDM Rules/Calculators/A&P                      Katelyn Morris is a 86 year old female with history of varicose veins who presents to the ED with right lower leg swelling.  Patient concern for DVT.  No respiratory symptoms.  No signs of volume overload on exam.  Clear breath sounds.  Has some pitting edema to the right leg compared to the left.  Has varicose/venous stasis changes likely causing swelling.  DVT study was negative.  Patient with good pulses in her feet bilaterally.  Overall patient given reassurance.  Recommend elevation, compression socks.  Discharged in good condition.  Given return precautions.  This chart was dictated using voice recognition software.  Despite best efforts to proofread,  errors can occur which can change the documentation meaning.    Final Clinical Impression(s) / ED Diagnoses Final diagnoses:  Leg edema    Rx / DC Orders ED Discharge Orders    None       Lennice Sites, DO 12/15/19 2009

## 2019-12-15 NOTE — ED Triage Notes (Addendum)
Pt reports bilateral leg swelling, right leg with pitting edema since yesterday. Denies chest pain. Pt has been vaccinated for covid

## 2019-12-15 NOTE — ED Notes (Signed)
US at bedside

## 2019-12-17 DIAGNOSIS — I10 Essential (primary) hypertension: Secondary | ICD-10-CM | POA: Diagnosis not present

## 2019-12-17 DIAGNOSIS — R609 Edema, unspecified: Secondary | ICD-10-CM | POA: Diagnosis not present

## 2019-12-18 ENCOUNTER — Telehealth: Payer: Self-pay | Admitting: Cardiovascular Disease

## 2019-12-18 NOTE — Telephone Encounter (Signed)
      I went in the pt;s chart several time trying to find out if pt's daughter was authorized on her DPR

## 2019-12-18 NOTE — Telephone Encounter (Signed)
Called patient, and spoke to daughter in law- she states that they took her to the ED, and they did a scan of her leg, it didn't show a DVT- but her and her husband (her son) notices a change in her walking, she has been swelling a lot- she denies chest pain, or shortness of breath, but they would like someone to look at her and see if any medications needs to be adjusted- they have a virtual visit with Dr.C coming up- but would rather see someone in office.   Made appointment with NP.  They verbalized understanding.

## 2019-12-18 NOTE — Telephone Encounter (Signed)
New message   Patient's daughter states that she would like to see Dr. Sallyanne Kuster earlier that 01/03/20. The patient is having issues with her leg. Please advise.

## 2019-12-23 NOTE — Progress Notes (Signed)
Cardiology Clinic Note   Patient Name: Cieara Fisk Date of Encounter: 12/24/2019  Primary Care Provider:  Lawerance Cruel, MD Primary Cardiologist:  Sanda Klein, MD  Patient Profile    Roslynn Amble 86 year old female presents today for an evaluation of her lower extremity edema.  Past Medical History    Past Medical History:  Diagnosis Date  . Arthritis    in knees per patient  . Dysrhythmia    Bradycardia  . Heart murmur   . Hypertension   . Hypothyroidism   . Pacemaker 02/20/2012   dual chamber  . SSS (sick sinus syndrome) ([place]) 08/06/2014   Past Surgical History:  Procedure Laterality Date  . ABDOMINAL HYSTERECTOMY    . bladder tack    . INSERT / REPLACE / REMOVE PACEMAKER    . PERMANENT PACEMAKER INSERTION N/A 02/20/2012   Procedure: PERMANENT PACEMAKER INSERTION;  Surgeon: Sanda Klein, MD;  Location: [facility] CATH LAB;  Service: Cardiovascular;  Laterality: N/A;  . ROTATOR CUFF REPAIR     bilateral    Allergies  Allergies  Allergen Reactions  . Sulfa Antibiotics Swelling  . Sulfonamide Derivatives Swelling    History of Present Illness    Ms. Guth has a past medical history of sinus node dysfunction, PPM (Medtronic Revo MRI conditional 2013), paroxysmal atrial tachycardia, essential hypertension, PAD, varicose veins, and lower extremity edema.  She follows with Dr. Sallyanne Kuster for her pacemaker.  She was last seen by Dr. Loletha Grayer on 11/12/2019.  During that time she complained of left intermittent calf claudication, rare instances of orthostatic dizziness, and mild ankle swelling.  She denied orthopnea, PND, exertional dyspnea, angina, palpitations, syncope and neurological events.  Her blood pressure was initially 160/70 in the office.  At home her blood pressure typically would range 130s over 70s.  Her Medtronic device was implanted in 2013 and functioning normally.  Presented to the emergency department on 12/15/2019.  There was concern for lower extremity  DVT.  Lower extremity DVT study negative.  Good pulses bilateral lower extremities.  Recommendations were made for elevating lower extremities, compression stockings.  She presents the clinic today for evaluation of her lower extremity edema and states her swelling is much better.  Over the last 1 to 2 weeks she has noticed increased swelling in her right lower extremity.  She has had dietary indiscretion with sodium and states that nothing tastes good without salt.  She states that she is physically active but has not been able to do things as fast as she wants was.  She has noticed decreased endurance/fatigue over the last month.  I will order an echocardiogram, give the salty 6 diet sheet, continue use of lower extremity support stockings and encouraged her to maintain hydration.  Today she denies chest pain, shortness of breath, lower extremity edema,  palpitations,diaphoresis, weakness, syncope, orthopnea, and PND.     Home Medications    Prior to Admission medications   Medication Sig Start Date End Date Taking? Authorizing Provider  ALPRAZolam Duanne Moron) 0.5 MG tablet Take 0.25 mg by mouth at bedtime as needed.    [provider]  aspirin 81 MG tablet Take 81 mg by mouth daily.    [provider]  atorvastatin (LIPITOR) 10 MG tablet Take 1 tablet (10 mg total) by mouth daily. 08/19/19 11/17/19  Croitoru, Mihai, MD  Cholecalciferol (VITAMIN D3 PO) Take 1 capsule by mouth daily.    [provider]  cilostazol (PLETAL) 50 MG tablet Take 50  mg by mouth 2 (two) times daily. 12/15/19   [provider]  hydrochlorothiazide (HYDRODIURIL) 12.5 MG tablet Take 1 tablet (12.5 mg total) by mouth daily. 10/25/19   Croitoru, Mihai, MD  levothyroxine (SYNTHROID, LEVOTHROID) 50 MCG tablet Take 50 mcg by mouth daily.    [provider]  metoprolol succinate (TOPROL-XL) 50 MG 24 hr tablet Take 50 mg by mouth daily. Take with or immediately following a meal.    [provider]    Family History    History reviewed. No pertinent family history. She indicated that her mother is deceased. She indicated that her father is deceased. She indicated that only one of her five sisters is alive. She indicated that both of her brothers are deceased.  Social History    Social History   Socioeconomic History  . Marital status: Widowed    Spouse name: Not on file  . Number of children: Not on file  . Years of education: Not on file  . Highest education level: Not on file  Occupational History  . Not on file  Tobacco Use  . Smoking status: Never Smoker  . Smokeless tobacco: Never Used  Substance and Sexual Activity  . Alcohol use: No  . Drug use: No  . Sexual activity: Never    Birth control/protection: Post-menopausal  Other Topics Concern  . Not on file  Social History Narrative  . Not on file   Social Determinants of Health   Financial Resource Strain:   . Difficulty of Paying Living Expenses:   Food Insecurity:   . Worried About Charity fundraiser in the Last Year:   . Arboriculturist in the Last Year:   Transportation Needs:   . Film/video editor (Medical):   Marland Kitchen Lack of Transportation (Non-Medical):   Physical Activity:   . Days of Exercise per Week:   . Minutes of Exercise per Session:   Stress:   . Feeling of Stress :   Social Connections:   . Frequency of Communication with Friends and Family:   . Frequency of Social Gatherings with Friends and Family:   . Attends Religious Services:   . Active Member of Clubs or Organizations:   . Attends Archivist Meetings:   Marland Kitchen Marital Status:   Intimate Partner Violence:   . Fear of Current or Ex-Partner:   . Emotionally Abused:   Marland Kitchen Physically Abused:   . Sexually Abused:      Review of Systems    General:  No chills, fever, night sweats or weight changes.  Cardiovascular:  No chest pain, dyspnea on exertion, edema, orthopnea, palpitations, paroxysmal nocturnal  dyspnea. Dermatological: No rash, lesions/masses Respiratory: No cough, dyspnea Urologic: No hematuria, dysuria Abdominal:   No nausea, vomiting, diarrhea, bright red blood per rectum, melena, or hematemesis Neurologic:  No visual changes, wkns, changes in mental status. All other systems reviewed and are otherwise negative except as noted above.  Physical Exam    VS:  BP 138/72   Pulse 63   Temp 98 F (36.7 C)   Ht 5\' 3"  (1.6 m)   Wt 117 lb (53.1 kg)   BMI 20.73 kg/m  , BMI Body mass index is 20.73 kg/m. GEN: Well nourished, well developed, in no acute distress. HEENT: normal. Neck: Supple, no JVD, carotid bruits, or masses. Cardiac: RRR, no murmurs, rubs, or gallops. No clubbing, cyanosis, edema.  Radials/DP/PT 2+ and equal bilaterally.  Respiratory:  Respirations regular and unlabored,  clear to auscultation bilaterally. GI: Soft, nontender, nondistended, BS + x 4. MS: no deformity or atrophy. Skin: warm and dry, no rash. Neuro:  Strength and sensation are intact. Psych: Normal affect.  Accessory Clinical Findings    ECG personally reviewed by me today-none today.  EKG 08/19/2019 Atrial paced with prolonged AV conduction septal infarct undetermined age 51 bpm  Echocardiogram 08/20/2014 Study Conclusions   - Left ventricle: The cavity size was normal. Wall thickness was  normal. Systolic function was normal. The estimated ejection  fraction was in the range of 55% to 60%. Wall motion was normal;  there were no regional wall motion abnormalities. Doppler  parameters are consistent with abnormal left ventricular  relaxation (grade 1 diastolic dysfunction).  - Aortic valve: There was mild regurgitation.  - Mitral valve: There was mild regurgitation.   Assessment & Plan   1.  Lower extremity edema-euvolemic today.  Indiscretion with sodium in her diet. Heart healthy low-sodium diet-salty 6 given Increase physical activity as tolerated Lower extremity  support stockings Elevate extremities when not active Daily weights  Diastolic heart failure-has noticed some increased fatigue over the last month. echocardiogram from 1/16 showed EF of 55-60% and grade 1 DD. Heart healthy low-sodium diet Increase physical activity as tolerated Daily weights Lower extremity support stockings Order echocardiogram  Essential hypertension-BP today 138/72.  Labile at home.   Continue HCTZ 12.5, metoprolol succinate. Heart healthy low-sodium diet-salty 6 given Increase physical activity as tolerated Continue to monitor  Presyncope/orthostatic hypotension -occasional episodes of presyncope with standing and turning quickly. He is constantly changing positions. Increase p.o. fluid consumption Heart healthy low-sodium diet-salty 6 given Increase physical activity as tolerated Continue lower extremity support stockings   Disposition: Follow-up with Dr. Sallyanne Kuster as scheduled.   Jossie Ng. Leif Loflin NP-C    12/24/2019, 9:50 AM [address] [place] [address] Office [phone] Fax [phone]

## 2019-12-24 ENCOUNTER — Encounter: Payer: Self-pay | Admitting: General Practice

## 2019-12-24 ENCOUNTER — Ambulatory Visit: Payer: Medicare HMO | Admitting: General Practice

## 2019-12-24 VITALS — BP 138/72 | HR 63 | Temp 98.0°F | Ht 63.0 in | Wt 117.0 lb

## 2019-12-24 DIAGNOSIS — R600 Localized edema: Secondary | ICD-10-CM | POA: Diagnosis not present

## 2019-12-24 DIAGNOSIS — I10 Essential (primary) hypertension: Secondary | ICD-10-CM

## 2019-12-24 DIAGNOSIS — I951 Orthostatic hypotension: Secondary | ICD-10-CM

## 2019-12-24 DIAGNOSIS — I5032 Chronic diastolic (congestive) heart failure: Secondary | ICD-10-CM

## 2019-12-24 DIAGNOSIS — I110 Hypertensive heart disease with heart failure: Secondary | ICD-10-CM | POA: Diagnosis not present

## 2019-12-24 NOTE — Patient Instructions (Addendum)
Medication Instructions:  The current medical regimen is effective;  continue present plan and medications as directed. Please refer to the Current Medication list given to you today. *If you need a refill on your cardiac medications before your next appointment, please call your pharmacy*  Testing/Procedures: Echocardiogram - Your physician has requested that you have an echocardiogram. Echocardiography is a painless test that uses sound waves to create images of your heart. It provides your doctor with information about the size and shape of your heart and how well your heart's chambers and valves are working. This procedure takes approximately one hour. There are no restrictions for this procedure. This will be performed at our [facility] location - [address].  Special Instructions PLEASE PURCHASE AND WEAR COMPRESSION STOCKINGS DAILY AND OFF AT BEDTIME. Compression stockings are elastic socks that squeeze the legs. They help to increase blood flow to the legs and to decrease swelling in the legs from fluid retention, and reduce the chance of developing blood clots in the lower legs. Please put on in the AM when dressing and off at night when dressing for bed. PLEASE MAKE SURE TO ELEVATE YOUR LEGS WHILE SITTING, THIS WILL HELP WITH THE SWELLING ALSO.  PLEASE READ AND FOLLOW SALTY 6-ATTACHED  Follow-Up: Your next appointment:  12 month(s) Please call our office 2 months in advance to schedule this appointment In Person with Sanda Klein, MD  At [facility], you and your health needs are our priority.  As part of our continuing mission to provide you with exceptional heart care, we have created designated Provider Care Teams.  These Care Teams include your primary Cardiologist (physician) and Advanced Practice Providers (APPs -  Physician Assistants and Nurse Practitioners) who all work together to provide you with the care you need, when you need it.  We recommend signing up  for the patient portal called "MyChart".  Sign up information is provided on this After Visit Summary.  MyChart is used to connect with patients for Virtual Visits (Telemedicine).  Patients are able to view lab/test results, encounter notes, upcoming appointments, etc.  Non-urgent messages can be sent to your provider as well.   To learn more about what you can do with MyChart, go to [email or web address].

## 2019-12-26 ENCOUNTER — Encounter (INDEPENDENT_AMBULATORY_CARE_PROVIDER_SITE_OTHER): Payer: Self-pay

## 2019-12-26 ENCOUNTER — Other Ambulatory Visit: Payer: Self-pay

## 2019-12-26 ENCOUNTER — Ambulatory Visit (HOSPITAL_COMMUNITY): Payer: Medicare HMO | Attending: General Practice

## 2019-12-26 DIAGNOSIS — I951 Orthostatic hypotension: Secondary | ICD-10-CM | POA: Insufficient documentation

## 2019-12-26 DIAGNOSIS — I10 Essential (primary) hypertension: Secondary | ICD-10-CM | POA: Diagnosis not present

## 2019-12-26 DIAGNOSIS — R600 Localized edema: Secondary | ICD-10-CM | POA: Diagnosis not present

## 2019-12-26 DIAGNOSIS — I5032 Chronic diastolic (congestive) heart failure: Secondary | ICD-10-CM | POA: Insufficient documentation

## 2020-01-03 ENCOUNTER — Telehealth: Payer: Self-pay | Admitting: Emergency Medicine

## 2020-01-03 ENCOUNTER — Telehealth (INDEPENDENT_AMBULATORY_CARE_PROVIDER_SITE_OTHER): Payer: Medicare HMO | Admitting: Cardiovascular Disease

## 2020-01-03 VITALS — BP 132/72 | HR 69 | Ht 63.0 in | Wt 109.0 lb

## 2020-01-03 DIAGNOSIS — E7800 Pure hypercholesterolemia, unspecified: Secondary | ICD-10-CM

## 2020-01-03 DIAGNOSIS — I739 Peripheral vascular disease, unspecified: Secondary | ICD-10-CM | POA: Diagnosis not present

## 2020-01-03 DIAGNOSIS — I872 Venous insufficiency (chronic) (peripheral): Secondary | ICD-10-CM

## 2020-01-03 DIAGNOSIS — I951 Orthostatic hypotension: Secondary | ICD-10-CM

## 2020-01-03 DIAGNOSIS — E039 Hypothyroidism, unspecified: Secondary | ICD-10-CM | POA: Diagnosis not present

## 2020-01-03 DIAGNOSIS — I5189 Other ill-defined heart diseases: Secondary | ICD-10-CM

## 2020-01-03 DIAGNOSIS — Z950 Presence of cardiac pacemaker: Secondary | ICD-10-CM | POA: Diagnosis not present

## 2020-01-03 DIAGNOSIS — I495 Sick sinus syndrome: Secondary | ICD-10-CM | POA: Diagnosis not present

## 2020-01-03 DIAGNOSIS — I10 Essential (primary) hypertension: Secondary | ICD-10-CM | POA: Diagnosis not present

## 2020-01-03 DIAGNOSIS — E785 Hyperlipidemia, unspecified: Secondary | ICD-10-CM | POA: Diagnosis not present

## 2020-01-03 DIAGNOSIS — I471 Supraventricular tachycardia: Secondary | ICD-10-CM | POA: Diagnosis not present

## 2020-01-03 DIAGNOSIS — N183 Chronic kidney disease, stage 3 unspecified: Secondary | ICD-10-CM | POA: Diagnosis not present

## 2020-01-03 MED ORDER — METOPROLOL SUCCINATE ER 25 MG PO TB24: 25.0000 mg | ORAL_TABLET | Freq: Every day | ORAL | 3 refills | Status: DC

## 2020-01-03 NOTE — Patient Instructions (Addendum)
Medication Instructions:  Decrease Metoprolol to 25 mg daily  *If you need a refill on your cardiac medications before your next appointment, please call your pharmacy*   Lab Work: None   Testing/Procedures: None   Follow-Up: At [facility], you and your health needs are our priority.  As part of our continuing mission to provide you with exceptional heart care, we have created designated Provider Care Teams.  These Care Teams include your primary Cardiologist (physician) and Advanced Practice Providers (APPs -  Physician Assistants and Nurse Practitioners) who all work together to provide you with the care you need, when you need it.  We recommend signing up for the patient portal called "MyChart".  Sign up information is provided on this After Visit Summary.  MyChart is used to connect with patients for Virtual Visits (Telemedicine).  Patients are able to view lab/test results, encounter notes, upcoming appointments, etc.  Non-urgent messages can be sent to your provider as well.   To learn more about what you can do with MyChart, go to [email or web address].    Your next appointment:   4-6 weeks  The format for your next appointment:   In Person  Provider:   Sanda Klein, MD   Other Instructions Your provider recommend you purchase an abdominal binder and thigh-high compression stockings from [facility].

## 2020-01-03 NOTE — Progress Notes (Signed)
Virtual Visit via Telephone Note   This visit type was conducted due to national recommendations for restrictions regarding the COVID-19 Pandemic (e.g. social distancing) in an effort to limit this patient's exposure and mitigate transmission in our community.  Due to her co-morbid illnesses, this patient is at least at moderate risk for complications without adequate follow up.  This format is felt to be most appropriate for this patient at this time.  The patient did not have access to video technology/had technical difficulties with video requiring transitioning to audio format only (telephone).  All issues noted in this document were discussed and addressed.  No physical exam could be performed with this format.  Please refer to the patient's chart for her  consent to telehealth for [facility].   The patient was identified using 2 identifiers.  Date:  01/03/2020   ID:  Katelyn Morris, DOB [birth year], MRN [id number]  Patient Location: Home Provider Location: Home  PCP:  Katelyn Cruel, MD  Cardiologist:  Katelyn Klein, MD  Electrophysiologist:  None   Evaluation Performed:  Follow-Up Visit  Chief Complaint:  Edema, weakness  History of Present Illness:    Katelyn Morris is a 86 y.o. female with history of sinus node dysfunction and a dual-chamber permanent pacemaker (2013, Medtronic), hypertension with orthostatic hypotension, atrial tachycardia, treated hypothyroidism.  She developed lower extremity pitting edema and was concern for DVT.  She was evaluated in the emergency room on May 23.  Ultrasound was negative for DVT.  Varicose veins were again noted.  Conservative management with limb elevation and compression stockings was recommended.  Today, early in the morning, she does not have any swelling.  Her major complaint is that she has reduced exercise ability.  She can walk for 50 to 75 feet after that she estimates stop and rest because she is afraid of poor  balance.  Her providers have described her as being wobbly and having a "weak gait".  She puts her compression stockings on first thing in the morning and wears them all day.    She denies dyspnea with activity, orthopnea, PND, palpitations, syncope, focal neurological events, falls or bleeding problems.  She has not had a cough or wheezing or hemoptysis.  She has not had intermittent claudication (she is known to have infrapopliteal disease, occluded left posterior tibial artery).  Her most recent pacemaker download was in April and as usual she has virtually 100% atrial paced, ventricular sensed rhythm.  In the past she has had episodes of atrial tachycardia, but none were seen during the last download.  She has normal left ventricular systolic function and aortic valve sclerosis explaining her systolic murmur. Her right carotid bruit was not associated with any significant obstruction by ultrasonography. A remote nuclear stress test did not show perfusion abnormalities.  The patient does not have symptoms concerning for COVID-19 infection (fever, chills, cough, or new shortness of breath).    Past Medical History:  Diagnosis Date  . Arthritis    in knees per patient  . Dysrhythmia    Bradycardia  . Heart murmur   . Hypertension   . Hypothyroidism   . Pacemaker 02/20/2012   dual chamber  . SSS (sick sinus syndrome) ([place]) 08/06/2014   Past Surgical History:  Procedure Laterality Date  . ABDOMINAL HYSTERECTOMY    . bladder tack    . INSERT / REPLACE / REMOVE PACEMAKER    . PERMANENT PACEMAKER INSERTION N/A 02/20/2012   Procedure: PERMANENT PACEMAKER  INSERTION;  Surgeon: Katelyn Klein, MD;  Location: [facility] CATH LAB;  Service: Cardiovascular;  Laterality: N/A;  . ROTATOR CUFF REPAIR     bilateral     Current Meds  Medication Sig  . ALPRAZolam (XANAX) 0.5 MG tablet Take 0.25 mg by mouth at bedtime as needed.  Marland Kitchen aspirin 81 MG tablet Take 81 mg by mouth daily.  Marland Kitchen atorvastatin (LIPITOR)  10 MG tablet Take 1 tablet (10 mg total) by mouth daily.  . Cholecalciferol (VITAMIN D3 PO) Take 2,000 Units by mouth daily.   . cilostazol (PLETAL) 50 MG tablet Take 50 mg by mouth 2 (two) times daily.  . hydrochlorothiazide (HYDRODIURIL) 12.5 MG tablet Take 1 tablet (12.5 mg total) by mouth daily.  Marland Kitchen levothyroxine (SYNTHROID, LEVOTHROID) 50 MCG tablet Take 50 mcg by mouth daily.  . metoprolol succinate (TOPROL-XL) 25 MG 24 hr tablet Take 1 tablet (25 mg total) by mouth daily. Take with or immediately following a meal.  . [DISCONTINUED] metoprolol succinate (TOPROL-XL) 50 MG 24 hr tablet Take 50 mg by mouth daily. Take with or immediately following a meal.     Allergies:   Sulfa antibiotics and Sulfonamide derivatives   Social History   Tobacco Use  . Smoking status: Never Smoker  . Smokeless tobacco: Never Used  Substance Use Topics  . Alcohol use: No  . Drug use: No     Family Hx: The patient's family history is not on file.  ROS:   Please see the history of present illness.    All other systems reviewed and are negative.  Prior CV studies:   The following studies were reviewed today: ED evaluation from May 23, including the venous Doppler study, labs, ECG.  Notes from Katelyn Morris from June 1.  Most recent pacemaker download April 27.  Labs/Other Tests and Data Reviewed:    EKG:  An ECG dated 08/19/2019 was personally reviewed today and demonstrated:  Atrial paced, ventricular sensed rhythm with long LV delay, possible LVH, lateral T wave inversion  Recent Labs: 10/29/2019: ALT 15; BUN 20; Creatinine, Ser 1.05; Potassium 4.6; Sodium 144; TSH 1.390   Recent Lipid Panel Lab Results  Component Value Date/Time   CHOL 121 10/29/2019 11:32 AM   TRIG 73 10/29/2019 11:32 AM   HDL 54 10/29/2019 11:32 AM   CHOLHDL 2.2 10/29/2019 11:32 AM   CHOLHDL 4.6 08/05/2014 09:22 AM   LDLCALC 52 10/29/2019 11:32 AM    Wt Readings from Last 3 Encounters:  01/03/20 109 lb (49.4 kg)    12/24/19 117 lb (53.1 kg)  12/15/19 122 lb (55.3 kg)     Objective:    Vital Signs:  BP 132/72   Pulse 69   Ht 5\' 3"  (1.6 m)   Wt 109 lb (49.4 kg)   BMI 19.31 kg/m    VITAL SIGNS:  reviewed Unable to examine  ASSESSMENT & PLAN:    1. Orthostatic hypotension   2. Essential hypertension   3. Paroxysmal atrial tachycardia ([place])   4. SSS (sick sinus syndrome) ([place])   5. Pacemaker - Medtronic Revo, dual chamber 2013   6. Peripheral venous insufficiency   7. Diastolic dysfunction   8. PAD (peripheral artery disease) ([place])   9. Hypercholesterolemia      1. Orthostatic hypotension: It's challenging to know just with a remote visit, but I suspect that her wobbly gait and need to take frequent rests may be an expression of orthostatic hypotension.  Previous attempts to discontinue her diuretic altogether  have led to edema and she had a recent emergency room visit.  We will reduce the dose of her metoprolol succinate to only 25 mg daily.  Advised switching to thigh-high compression stockings and/or using an abdominal binder whenever she will be walking. 2. PAT: This is occasionally recorded on her pacemaker and not particularly symptomatic.  It may be AV node reentry dependent tachycardia since it often occurs when we check a lead pacing thresholds.  We will use her device to monitor for increased frequency after reduce the beta-blocker dose.   3. HTN: Allow systolic blood pressures in the 150-160 range. 4. SSS: Heart rate histogram distribution was fair at her last download.  She does not have symptoms of chronotropic incompetence in my opinion; the exertional fatigue probably has another explanation.  Reassess at next visit to see if we should turn off her rate response sensor. 5. Pacemaker: Normal device function of the last remote download.  She has another download scheduled in about a month's time. 6. Edema: I think this is mostly related to peripheral venous insufficiency.  Excessive  diuretics should be avoided.  Keep legs elevated and use compression stockings. 7. Diastolic dysfunction: I am not convinced that she is ever truly had diastolic heart failure.  For a 86 year old, her annular diastolic velocities are probably normal.  She does not have left ventricular hypertrophy or left atrial dilation.  Shortness of breath is not of a complaint. 8. PAD: Known occlusion of 1 of 3 infrapopliteal vessels on the left side.  Currently asymptomatic. 9. HLP: Lipid parameters in target range when last checked.  COVID-19 Education: The signs and symptoms of COVID-19 were discussed with the patient and how to seek care for testing (follow up with PCP or arrange E-visit).  The importance of social distancing was discussed today.  Time:   Today, I have spent 23 minutes with the patient with telehealth technology discussing the above problems.     Medication Adjustments/Labs and Tests Ordered: Current medicines are reviewed at length with the patient today.  Concerns regarding medicines are outlined above.   Tests Ordered: No orders of the defined types were placed in this encounter.   Medication Changes: Meds ordered this encounter  Medications  . metoprolol succinate (TOPROL-XL) 25 MG 24 hr tablet    Sig: Take 1 tablet (25 mg total) by mouth daily. Take with or immediately following a meal.    Dispense:  90 tablet    Refill:  3    Follow Up: Reassessed by virtual visit or in person in 4-6 weeks.  Office visit with pacemaker check In Person 6 months   Patient Instructions  Medication Instructions:  Decrease Metoprolol to 25 mg daily  *If you need a refill on your cardiac medications before your next appointment, please call your pharmacy*   Lab Work: None   Testing/Procedures: None   Follow-Up: At [facility], you and your health needs are our priority.  As part of our continuing mission to provide you with exceptional heart care, we have created designated  Provider Care Teams.  These Care Teams include your primary Cardiologist (physician) and Advanced Practice Providers (APPs -  Physician Assistants and Nurse Practitioners) who all work together to provide you with the care you need, when you need it.  We recommend signing up for the patient portal called "MyChart".  Sign up information is provided on this After Visit Summary.  MyChart is used to connect with patients for Virtual Visits (Telemedicine).  Patients are able to view lab/test results, encounter notes, upcoming appointments, etc.  Non-urgent messages can be sent to your provider as well.   To learn more about what you can do with MyChart, go to [email or web address].    Your next appointment:   4-6 weeks  The format for your next appointment:   In Person  Provider:   Sanda Klein, MD   Other Instructions Your provider recommend you purchase an abdominal binder and thigh-high compression stockings from [facility].    Signed, Katelyn Klein, MD  01/03/2020 10:55 AM    [place]

## 2020-01-03 NOTE — Telephone Encounter (Signed)
Patient returning call to [facility]. Transferred call.

## 2020-01-03 NOTE — Addendum Note (Signed)
Addended by: Meryl Crutch on: 01/03/2020 03:23 PM   Modules accepted: Orders

## 2020-01-06 ENCOUNTER — Other Ambulatory Visit: Payer: Self-pay | Admitting: *Deleted

## 2020-01-06 ENCOUNTER — Other Ambulatory Visit: Payer: Self-pay

## 2020-01-06 ENCOUNTER — Telehealth: Payer: Self-pay | Admitting: Cardiovascular Disease

## 2020-01-06 DIAGNOSIS — I951 Orthostatic hypotension: Secondary | ICD-10-CM

## 2020-01-06 NOTE — Telephone Encounter (Signed)
Informed daughter in law that epic system will not allow me to send order electronically. Will have MD sign paper prescription and fax when he return on 6/16. Daughter in law verbalized understanding.

## 2020-01-06 NOTE — Telephone Encounter (Signed)
    Pt's daughter in law calling, she is following up pt's compression stocking and abdominal binder DR. C ordered for the pt. She said it should send to [facility] [facility] fax # [phone] attn Nevin Bloodgood

## 2020-01-08 NOTE — Telephone Encounter (Signed)
Hey, are you aware of this?  I know she was asking how to send this- wanted to check in with you as well.   Thanks!

## 2020-01-08 NOTE — Telephone Encounter (Signed)
The prescription has been signed and faxed to the number provided. The prescription was on hold until the provider was back in the office and could sign it.

## 2020-01-08 NOTE — Telephone Encounter (Signed)
[name] is called stating the medical supply company has not received the order yet. She wants to know what is the hold up.

## 2020-01-13 DIAGNOSIS — E039 Hypothyroidism, unspecified: Secondary | ICD-10-CM | POA: Diagnosis not present

## 2020-01-13 DIAGNOSIS — R030 Elevated blood-pressure reading, without diagnosis of hypertension: Secondary | ICD-10-CM | POA: Diagnosis not present

## 2020-01-13 DIAGNOSIS — E785 Hyperlipidemia, unspecified: Secondary | ICD-10-CM | POA: Diagnosis not present

## 2020-01-13 DIAGNOSIS — I10 Essential (primary) hypertension: Secondary | ICD-10-CM | POA: Diagnosis not present

## 2020-01-13 DIAGNOSIS — H6123 Impacted cerumen, bilateral: Secondary | ICD-10-CM | POA: Diagnosis not present

## 2020-01-16 DIAGNOSIS — F411 Generalized anxiety disorder: Secondary | ICD-10-CM | POA: Diagnosis not present

## 2020-01-16 DIAGNOSIS — L570 Actinic keratosis: Secondary | ICD-10-CM | POA: Diagnosis not present

## 2020-01-16 DIAGNOSIS — E785 Hyperlipidemia, unspecified: Secondary | ICD-10-CM | POA: Diagnosis not present

## 2020-01-16 DIAGNOSIS — H6123 Impacted cerumen, bilateral: Secondary | ICD-10-CM | POA: Diagnosis not present

## 2020-01-16 DIAGNOSIS — N183 Chronic kidney disease, stage 3 unspecified: Secondary | ICD-10-CM | POA: Diagnosis not present

## 2020-01-16 DIAGNOSIS — Z0000 Encounter for general adult medical examination without abnormal findings: Secondary | ICD-10-CM | POA: Diagnosis not present

## 2020-01-16 DIAGNOSIS — I10 Essential (primary) hypertension: Secondary | ICD-10-CM | POA: Diagnosis not present

## 2020-01-16 DIAGNOSIS — E039 Hypothyroidism, unspecified: Secondary | ICD-10-CM | POA: Diagnosis not present

## 2020-02-03 DIAGNOSIS — I10 Essential (primary) hypertension: Secondary | ICD-10-CM | POA: Diagnosis not present

## 2020-02-03 DIAGNOSIS — E039 Hypothyroidism, unspecified: Secondary | ICD-10-CM | POA: Diagnosis not present

## 2020-02-03 DIAGNOSIS — E785 Hyperlipidemia, unspecified: Secondary | ICD-10-CM | POA: Diagnosis not present

## 2020-02-03 DIAGNOSIS — N183 Chronic kidney disease, stage 3 unspecified: Secondary | ICD-10-CM | POA: Diagnosis not present

## 2020-02-04 DIAGNOSIS — I10 Essential (primary) hypertension: Secondary | ICD-10-CM | POA: Diagnosis not present

## 2020-02-04 DIAGNOSIS — L989 Disorder of the skin and subcutaneous tissue, unspecified: Secondary | ICD-10-CM | POA: Diagnosis not present

## 2020-02-04 DIAGNOSIS — H6120 Impacted cerumen, unspecified ear: Secondary | ICD-10-CM | POA: Diagnosis not present

## 2020-02-04 DIAGNOSIS — I739 Peripheral vascular disease, unspecified: Secondary | ICD-10-CM | POA: Diagnosis not present

## 2020-02-07 ENCOUNTER — Other Ambulatory Visit: Payer: Self-pay | Admitting: Cardiovascular Disease

## 2020-02-07 NOTE — Telephone Encounter (Signed)
Rx(s) sent to pharmacy electronically.  

## 2020-02-14 ENCOUNTER — Ambulatory Visit (INDEPENDENT_AMBULATORY_CARE_PROVIDER_SITE_OTHER): Payer: Medicare HMO | Admitting: Cardiovascular Disease

## 2020-02-14 ENCOUNTER — Encounter: Payer: Self-pay | Admitting: Cardiovascular Disease

## 2020-02-14 ENCOUNTER — Other Ambulatory Visit: Payer: Self-pay

## 2020-02-14 VITALS — BP 187/70 | HR 61 | Ht 63.0 in | Wt 119.0 lb

## 2020-02-14 DIAGNOSIS — I10 Essential (primary) hypertension: Secondary | ICD-10-CM

## 2020-02-14 DIAGNOSIS — I471 Supraventricular tachycardia: Secondary | ICD-10-CM

## 2020-02-14 DIAGNOSIS — I739 Peripheral vascular disease, unspecified: Secondary | ICD-10-CM | POA: Diagnosis not present

## 2020-02-14 DIAGNOSIS — I951 Orthostatic hypotension: Secondary | ICD-10-CM | POA: Diagnosis not present

## 2020-02-14 DIAGNOSIS — R269 Unspecified abnormalities of gait and mobility: Secondary | ICD-10-CM | POA: Diagnosis not present

## 2020-02-14 DIAGNOSIS — I495 Sick sinus syndrome: Secondary | ICD-10-CM | POA: Diagnosis not present

## 2020-02-14 DIAGNOSIS — E7800 Pure hypercholesterolemia, unspecified: Secondary | ICD-10-CM

## 2020-02-14 DIAGNOSIS — I4719 Other supraventricular tachycardia: Secondary | ICD-10-CM

## 2020-02-14 DIAGNOSIS — Z950 Presence of cardiac pacemaker: Secondary | ICD-10-CM | POA: Diagnosis not present

## 2020-02-14 DIAGNOSIS — I872 Venous insufficiency (chronic) (peripheral): Secondary | ICD-10-CM

## 2020-02-14 NOTE — Patient Instructions (Signed)
Medication Instructions:  No Changes *If you need a refill on your cardiac medications before your next appointment, please call your pharmacy*   Lab Work: None ordered If you have labs (blood work) drawn today and your tests are completely normal, you will receive your results only by: Marland Kitchen MyChart Message (if you have MyChart) OR . A paper copy in the mail If you have any lab test that is abnormal or we need to change your treatment, we will call you to review the results.   Testing/Procedures: None Ordered   Follow-Up: At [facility], you and your health needs are our priority.  As part of our continuing mission to provide you with exceptional heart care, we have created designated Provider Care Teams.  These Care Teams include your primary Cardiologist (physician) and Advanced Practice Providers (APPs -  Physician Assistants and Nurse Practitioners) who all work together to provide you with the care you need, when you need it.  We recommend signing up for the patient portal called "MyChart".  Sign up information is provided on this After Visit Summary.  MyChart is used to connect with patients for Virtual Visits (Telemedicine).  Patients are able to view lab/test results, encounter notes, upcoming appointments, etc.  Non-urgent messages can be sent to your provider as well.   To learn more about what you can do with MyChart, go to [email or web address].    Your next appointment:   6 month(s)  The format for your next appointment:   In Person  Provider:   Sanda Klein, MD   Other Instructions Out patient PT evaluation

## 2020-02-14 NOTE — Progress Notes (Signed)
Patient ID: Katelyn Morris, female   DOB: [birth year], 86 y.o.   MRN: [id number]    Cardiology Office Note    Date:  02/16/2020   ID:  Chantel, Teti [birth year], MRN [id number]  PCP:  Lawerance Cruel, MD  Cardiologist:   Sanda Klein, MD   Chief Complaint  Patient presents with  . Dizziness    History of Present Illness:  Katelyn Morris is a 86 y.o. female with history of sinus node dysfunction and pacemaker implantation (Medtronic Revo MRI conditional 2013), paroxysmal atrial tachycardia, essential hypertension and severe orthostatic hypotension, mild PAD, varicose veins who returns for a pacemaker check.  She has been wearing the compression stockings and at times has worn the abdominal binder.  This has helped with her lower extremity edema and orthostatic dizziness.  Her systolic blood pressure at home has been 130s-160s.  Her diastolic usually runs in the 50s or low 60s.  Her family is trying to make sure that she has an adequate intake of food.  She has gained back a few pounds.  She denies orthopnea, PND, exertional dyspnea, angina pectoris at rest or with activity, palpitations, syncope or focal neurological events.  She has documented occlusion of the L posterior tibial (biphasic flow in the other two infrapopliteal branches).  She continues to have a wobbly gait at times and is worried about falling. Her Medtronic Revo device was implanted in 2013. Device function is normal.  Her last pacemaker download battery voltage is 2.95 V (RRT 2.81 V).  Lead parameters are normal.  She has virtually 100% atrial pacing and virtually never requires ventricular pacing.  The heart rate histogram distribution is blunted but she is very sedentary.  She has occasional episodes of atrial tachycardia with 1: 1 AV conduction.  Overall burden remains very low at under 0.1%.  As has happened in the past, when I attempted to check ventricular pacing thresholds today, brief bursts of atrial  tachycardia were induced, suggesting an AV node dependent mechanism.  She has normal left ventricular systolic function and aortic valve sclerosis explaining her systolic murmur. Her right carotid bruit was not associated with any significant obstruction by ultrasonography. A remote nuclear stress test did not show perfusion abnormalities.     Past Medical History:  Diagnosis Date  . Arthritis    in knees per patient  . Dysrhythmia    Bradycardia  . Heart murmur   . Hypertension   . Hypothyroidism   . Pacemaker 02/20/2012   dual chamber  . SSS (sick sinus syndrome) ([place]) 08/06/2014    Past Surgical History:  Procedure Laterality Date  . ABDOMINAL HYSTERECTOMY    . bladder tack    . INSERT / REPLACE / REMOVE PACEMAKER    . PERMANENT PACEMAKER INSERTION N/A 02/20/2012   Procedure: PERMANENT PACEMAKER INSERTION;  Surgeon: Sanda Klein, MD;  Location: [facility] CATH LAB;  Service: Cardiovascular;  Laterality: N/A;  . ROTATOR CUFF REPAIR     bilateral    Outpatient Medications Prior to Visit  Medication Sig Dispense Refill  . ALPRAZolam (XANAX) 0.5 MG tablet Take 0.25 mg by mouth at bedtime as needed.    Marland Kitchen aspirin 81 MG tablet Take 81 mg by mouth daily.    Marland Kitchen atorvastatin (LIPITOR) 10 MG tablet Take 1 tablet (10 mg total) by mouth daily. 30 tablet 11  . Cholecalciferol (VITAMIN D3 PO) Take 2,000 Units by mouth daily.     . cilostazol (PLETAL) 50 MG tablet Take  1 tablet (50 mg total) by mouth 2 (two) times daily. 60 tablet 11  . hydrochlorothiazide (HYDRODIURIL) 12.5 MG tablet Take 1 tablet (12.5 mg total) by mouth daily. 90 tablet 3  . levothyroxine (SYNTHROID, LEVOTHROID) 50 MCG tablet Take 50 mcg by mouth daily.    . metoprolol succinate (TOPROL-XL) 25 MG 24 hr tablet Take 1 tablet (25 mg total) by mouth daily. Take with or immediately following a meal. 90 tablet 3   No facility-administered medications prior to visit.     Allergies:   Sulfa antibiotics and Sulfonamide derivatives    Social History   Socioeconomic History  . Marital status: Widowed    Spouse name: Not on file  . Number of children: Not on file  . Years of education: Not on file  . Highest education level: Not on file  Occupational History  . Not on file  Tobacco Use  . Smoking status: Never Smoker  . Smokeless tobacco: Never Used  Substance and Sexual Activity  . Alcohol use: No  . Drug use: No  . Sexual activity: Never    Birth control/protection: Post-menopausal  Other Topics Concern  . Not on file  Social History Narrative  . Not on file   Social Determinants of Health   Financial Resource Strain:   . Difficulty of Paying Living Expenses:   Food Insecurity:   . Worried About Charity fundraiser in the Last Year:   . Arboriculturist in the Last Year:   Transportation Needs:   . Film/video editor (Medical):   Marland Kitchen Lack of Transportation (Non-Medical):   Physical Activity:   . Days of Exercise per Week:   . Minutes of Exercise per Session:   Stress:   . Feeling of Stress :   Social Connections:   . Frequency of Communication with Friends and Family:   . Frequency of Social Gatherings with Friends and Family:   . Attends Religious Services:   . Active Member of Clubs or Organizations:   . Attends Archivist Meetings:   Marland Kitchen Marital Status:       ROS:   Please see the history of present illness.   All other systems are reviewed and are negative.   PHYSICAL EXAM:   VS:  BP (!) 187/70   Pulse 61   Ht 5\' 3"  (1.6 m)   Wt 119 lb (54 kg)   SpO2 99%   BMI 21.08 kg/m     General: Alert, oriented x3, no distress, appears younger than stated age.  Healthy subclavian pacemaker site. Head: no evidence of trauma, PERRL, EOMI, no exophtalmos or lid lag, no myxedema, no xanthelasma; normal ears, nose and oropharynx Neck: normal jugular venous pulsations and no hepatojugular reflux; brisk carotid pulses without delay and no carotid bruits Chest: clear to auscultation, no  signs of consolidation by percussion or palpation, normal fremitus, symmetrical and full respiratory excursions Cardiovascular: normal position and quality of the apical impulse, regular rhythm, normal first and second heart sounds, no murmurs, rubs or gallops Abdomen: no tenderness or distention, no masses by palpation, no abnormal pulsatility or arterial bruits, normal bowel sounds, no hepatosplenomegaly Extremities: no clubbing, cyanosis or edema; wearing compression stockings; 2+ radial, ulnar and brachial pulses bilaterally; 2+ right femoral, posterior tibial and dorsalis pedis pulses; 2+ left femoral, posterior tibial and dorsalis pedis pulses; no subclavian or femoral bruits Neurological: grossly nonfocal except moderately hard of hearing Psych: Normal mood and affect   Wt Readings  from Last 3 Encounters:  02/14/20 119 lb (54 kg)  01/03/20 109 lb (49.4 kg)  12/24/19 117 lb (53.1 kg)    Studies/Labs Reviewed:   EKG: Ordered today, shows atrial paced, ventricular sensed rhythm with QS pattern in leads V1 V2 and QTC of 372 ms Lipid Panel    Component Value Date/Time   CHOL 121 10/29/2019 1132   TRIG 73 10/29/2019 1132   HDL 54 10/29/2019 1132   CHOLHDL 2.2 10/29/2019 1132   CHOLHDL 4.6 08/05/2014 0922   VLDL 40 08/05/2014 0922   LDLCALC 52 10/29/2019 1132    03/14/2019 total cholesterol 215, LDL 137, HDL 39, triglycerides 196  Hemoglobin 13.2, creatinine 0.94, potassium 4.7  10/29/2019 Total cholesterol 121, HDL 54, triglycerides 73, LDL 60 Creatinine 1.05, potassium 4.6, normal liver function test, TSH 1.39  ASSESSMENT:    1. PAD (peripheral artery disease) (HCC)   2. Paroxysmal atrial tachycardia ([place])   3. SSS (sick sinus syndrome) ([place])   4. Pacemaker - Medtronic Revo, dual chamber 2013   5. Essential hypertension   6. Orthostatic hypotension   7. Peripheral venous insufficiency   8. Hypercholesterolemia   9. Gait difficulty      PLAN:  In order of problems  listed above:  1. PAD: No complaints recently.  It seems that the cilostazol is helping.  No plan for invasive angiography. 2. SVT: Episodes are brief and asymptomatic and overall burden is very low.  Unsure whether this is AV node reentry or ectopic atrial tachycardia.  No additional medication changes made today. 3. SSS: Atrial paced rhythm today.  Initial presentation was with sinus bradycardia and near syncope.  Heart rate histograms remain blunted, but she is very sedentary and has no complaints of chronotropic incompetence symptoms. 4. PM: Not device dependent.  Normal device function.  Reevaluate in clinic every 6 months unless we can reliably get remote downloads every 3 months. 5. HTN: She has documented to have drops in blood pressure of about 35 mmHg with standing up so we will tolerate systolic blood pressure in the 150-160 range. 6. Orthostatic hypotension: Doing much better with compression stockings in the abdominal binder.   7. Peripheral venous insufficiency: This is the major reason for her lower extremity edema.  She has prominent varicose veins if she does not wear the stockings.  Discussed the fact that this will not resolve with diuretics which could worsen her orthostatic dizziness. 8. HLP: All lipid parameters are in target range, LDL is less than 70. 9. Gait instability: I am concerned about the risk of falls, as is her daughter.  We will try to arrange a physical therapy evaluation.  Katelyn Morris should not drive.  She would benefit from home PT.    Medication Adjustments/Labs and Tests Ordered: Current medicines are reviewed at length with the patient today.  Concerns regarding medicines are outlined above.  Medication changes, Labs and Tests ordered today are listed in the Patient Instructions below. Patient Instructions  Medication Instructions:  No Changes *If you need a refill on your cardiac medications before your next appointment, please call your pharmacy*   Lab  Work: None ordered If you have labs (blood work) drawn today and your tests are completely normal, you will receive your results only by: Marland Kitchen MyChart Message (if you have MyChart) OR . A paper copy in the mail If you have any lab test that is abnormal or we need to change your treatment, we will call you to review the  results.   Testing/Procedures: None Ordered   Follow-Up: At [facility], you and your health needs are our priority.  As part of our continuing mission to provide you with exceptional heart care, we have created designated Provider Care Teams.  These Care Teams include your primary Cardiologist (physician) and Advanced Practice Providers (APPs -  Physician Assistants and Nurse Practitioners) who all work together to provide you with the care you need, when you need it.  We recommend signing up for the patient portal called "MyChart".  Sign up information is provided on this After Visit Summary.  MyChart is used to connect with patients for Virtual Visits (Telemedicine).  Patients are able to view lab/test results, encounter notes, upcoming appointments, etc.  Non-urgent messages can be sent to your provider as well.   To learn more about what you can do with MyChart, go to [email or web address].    Your next appointment:   6 month(s)  The format for your next appointment:   In Person  Provider:   Sanda Klein, MD   Other Instructions Out patient PT evaluation    Signed, Sanda Klein, MD  02/16/2020 3:49 PM    [address], [place], [place]  [address] Phone: [phone]; Fax: [phone]

## 2020-02-16 ENCOUNTER — Encounter: Payer: Self-pay | Admitting: Cardiovascular Disease

## 2020-02-18 ENCOUNTER — Ambulatory Visit (INDEPENDENT_AMBULATORY_CARE_PROVIDER_SITE_OTHER): Payer: Medicare HMO | Admitting: *Deleted

## 2020-02-18 DIAGNOSIS — I495 Sick sinus syndrome: Secondary | ICD-10-CM

## 2020-02-19 LAB — CUP PACEART REMOTE DEVICE CHECK
Battery Voltage: 2.93 V
Brady Statistic AP VP Percent: 0.04 %
Brady Statistic AP VS Percent: 85.81 %
Brady Statistic AS VP Percent: 0 %
Brady Statistic AS VS Percent: 14.15 %
Brady Statistic RA Percent Paced: 85.59 %
Brady Statistic RV Percent Paced: 0.05 %
Date Time Interrogation Session: 20210727115000
Implantable Lead Implant Date: 20130729
Implantable Lead Implant Date: 20130729
Implantable Lead Location: 753859
Implantable Lead Location: 753860
Implantable Pulse Generator Implant Date: 20130729
Lead Channel Impedance Value: 416 Ohm
Lead Channel Impedance Value: 512 Ohm
Lead Channel Sensing Intrinsic Amplitude: 1.869 mV
Lead Channel Sensing Intrinsic Amplitude: 10.916 mV
Lead Channel Setting Pacing Amplitude: 2 V
Lead Channel Setting Pacing Amplitude: 2.5 V
Lead Channel Setting Pacing Pulse Width: 0.4 ms
Lead Channel Setting Sensing Sensitivity: 0.9 mV

## 2020-02-21 DIAGNOSIS — I70203 Unspecified atherosclerosis of native arteries of extremities, bilateral legs: Secondary | ICD-10-CM | POA: Diagnosis not present

## 2020-02-21 DIAGNOSIS — Z950 Presence of cardiac pacemaker: Secondary | ICD-10-CM | POA: Diagnosis not present

## 2020-02-21 DIAGNOSIS — I495 Sick sinus syndrome: Secondary | ICD-10-CM | POA: Diagnosis not present

## 2020-02-21 DIAGNOSIS — I951 Orthostatic hypotension: Secondary | ICD-10-CM | POA: Diagnosis not present

## 2020-02-21 DIAGNOSIS — R2681 Unsteadiness on feet: Secondary | ICD-10-CM | POA: Diagnosis not present

## 2020-02-21 DIAGNOSIS — I739 Peripheral vascular disease, unspecified: Secondary | ICD-10-CM | POA: Diagnosis not present

## 2020-02-21 NOTE — Progress Notes (Signed)
Remote pacemaker transmission.   

## 2020-02-25 DIAGNOSIS — Z950 Presence of cardiac pacemaker: Secondary | ICD-10-CM | POA: Diagnosis not present

## 2020-02-25 DIAGNOSIS — I951 Orthostatic hypotension: Secondary | ICD-10-CM | POA: Diagnosis not present

## 2020-02-25 DIAGNOSIS — R2681 Unsteadiness on feet: Secondary | ICD-10-CM | POA: Diagnosis not present

## 2020-02-25 DIAGNOSIS — I739 Peripheral vascular disease, unspecified: Secondary | ICD-10-CM | POA: Diagnosis not present

## 2020-02-25 DIAGNOSIS — I70203 Unspecified atherosclerosis of native arteries of extremities, bilateral legs: Secondary | ICD-10-CM | POA: Diagnosis not present

## 2020-02-25 DIAGNOSIS — I495 Sick sinus syndrome: Secondary | ICD-10-CM | POA: Diagnosis not present

## 2020-02-26 DIAGNOSIS — L821 Other seborrheic keratosis: Secondary | ICD-10-CM | POA: Diagnosis not present

## 2020-02-26 DIAGNOSIS — L570 Actinic keratosis: Secondary | ICD-10-CM | POA: Diagnosis not present

## 2020-02-26 DIAGNOSIS — L820 Inflamed seborrheic keratosis: Secondary | ICD-10-CM | POA: Diagnosis not present

## 2020-02-26 DIAGNOSIS — D692 Other nonthrombocytopenic purpura: Secondary | ICD-10-CM | POA: Diagnosis not present

## 2020-02-27 DIAGNOSIS — Z950 Presence of cardiac pacemaker: Secondary | ICD-10-CM | POA: Diagnosis not present

## 2020-02-27 DIAGNOSIS — I951 Orthostatic hypotension: Secondary | ICD-10-CM | POA: Diagnosis not present

## 2020-02-27 DIAGNOSIS — I70203 Unspecified atherosclerosis of native arteries of extremities, bilateral legs: Secondary | ICD-10-CM | POA: Diagnosis not present

## 2020-02-27 DIAGNOSIS — I495 Sick sinus syndrome: Secondary | ICD-10-CM | POA: Diagnosis not present

## 2020-02-27 DIAGNOSIS — I739 Peripheral vascular disease, unspecified: Secondary | ICD-10-CM | POA: Diagnosis not present

## 2020-02-27 DIAGNOSIS — R2681 Unsteadiness on feet: Secondary | ICD-10-CM | POA: Diagnosis not present

## 2020-03-02 ENCOUNTER — Telehealth: Payer: Self-pay | Admitting: Cardiovascular Disease

## 2020-03-02 DIAGNOSIS — I70203 Unspecified atherosclerosis of native arteries of extremities, bilateral legs: Secondary | ICD-10-CM | POA: Diagnosis not present

## 2020-03-02 DIAGNOSIS — I739 Peripheral vascular disease, unspecified: Secondary | ICD-10-CM | POA: Diagnosis not present

## 2020-03-02 DIAGNOSIS — R2681 Unsteadiness on feet: Secondary | ICD-10-CM | POA: Diagnosis not present

## 2020-03-02 DIAGNOSIS — Z950 Presence of cardiac pacemaker: Secondary | ICD-10-CM | POA: Diagnosis not present

## 2020-03-02 DIAGNOSIS — I951 Orthostatic hypotension: Secondary | ICD-10-CM | POA: Diagnosis not present

## 2020-03-02 DIAGNOSIS — I495 Sick sinus syndrome: Secondary | ICD-10-CM | POA: Diagnosis not present

## 2020-03-02 DIAGNOSIS — R296 Repeated falls: Secondary | ICD-10-CM | POA: Diagnosis not present

## 2020-03-02 NOTE — Telephone Encounter (Signed)
She is calling to report patient had a fall on Saturday. She slipped out of her chair has some tenderness, but otherwise is doing okay.

## 2020-03-02 NOTE — Telephone Encounter (Signed)
Spoke to patient she stated she fell out of chair this past Saturday.She lost her balance.Stated her elbow went into left chest.Stated when she coughs she has a soreness under left breast.Advised to call PCP if does not approve.Advised I will make Dr.Croitoru aware.

## 2020-03-04 DIAGNOSIS — I739 Peripheral vascular disease, unspecified: Secondary | ICD-10-CM | POA: Diagnosis not present

## 2020-03-04 DIAGNOSIS — R2681 Unsteadiness on feet: Secondary | ICD-10-CM | POA: Diagnosis not present

## 2020-03-04 DIAGNOSIS — I951 Orthostatic hypotension: Secondary | ICD-10-CM | POA: Diagnosis not present

## 2020-03-04 DIAGNOSIS — Z950 Presence of cardiac pacemaker: Secondary | ICD-10-CM | POA: Diagnosis not present

## 2020-03-04 DIAGNOSIS — I495 Sick sinus syndrome: Secondary | ICD-10-CM | POA: Diagnosis not present

## 2020-03-04 DIAGNOSIS — I70203 Unspecified atherosclerosis of native arteries of extremities, bilateral legs: Secondary | ICD-10-CM | POA: Diagnosis not present

## 2020-03-09 DIAGNOSIS — I70203 Unspecified atherosclerosis of native arteries of extremities, bilateral legs: Secondary | ICD-10-CM | POA: Diagnosis not present

## 2020-03-09 DIAGNOSIS — Z950 Presence of cardiac pacemaker: Secondary | ICD-10-CM | POA: Diagnosis not present

## 2020-03-09 DIAGNOSIS — R2681 Unsteadiness on feet: Secondary | ICD-10-CM | POA: Diagnosis not present

## 2020-03-09 DIAGNOSIS — I495 Sick sinus syndrome: Secondary | ICD-10-CM | POA: Diagnosis not present

## 2020-03-09 DIAGNOSIS — I951 Orthostatic hypotension: Secondary | ICD-10-CM | POA: Diagnosis not present

## 2020-03-09 DIAGNOSIS — I739 Peripheral vascular disease, unspecified: Secondary | ICD-10-CM | POA: Diagnosis not present

## 2020-03-11 DIAGNOSIS — R2681 Unsteadiness on feet: Secondary | ICD-10-CM | POA: Diagnosis not present

## 2020-03-11 DIAGNOSIS — I70203 Unspecified atherosclerosis of native arteries of extremities, bilateral legs: Secondary | ICD-10-CM | POA: Diagnosis not present

## 2020-03-11 DIAGNOSIS — I739 Peripheral vascular disease, unspecified: Secondary | ICD-10-CM | POA: Diagnosis not present

## 2020-03-11 DIAGNOSIS — Z950 Presence of cardiac pacemaker: Secondary | ICD-10-CM | POA: Diagnosis not present

## 2020-03-11 DIAGNOSIS — I951 Orthostatic hypotension: Secondary | ICD-10-CM | POA: Diagnosis not present

## 2020-03-11 DIAGNOSIS — I495 Sick sinus syndrome: Secondary | ICD-10-CM | POA: Diagnosis not present

## 2020-03-18 DIAGNOSIS — I495 Sick sinus syndrome: Secondary | ICD-10-CM | POA: Diagnosis not present

## 2020-03-18 DIAGNOSIS — I70203 Unspecified atherosclerosis of native arteries of extremities, bilateral legs: Secondary | ICD-10-CM | POA: Diagnosis not present

## 2020-03-18 DIAGNOSIS — I951 Orthostatic hypotension: Secondary | ICD-10-CM | POA: Diagnosis not present

## 2020-03-18 DIAGNOSIS — R2681 Unsteadiness on feet: Secondary | ICD-10-CM | POA: Diagnosis not present

## 2020-03-18 DIAGNOSIS — I739 Peripheral vascular disease, unspecified: Secondary | ICD-10-CM | POA: Diagnosis not present

## 2020-03-18 DIAGNOSIS — Z950 Presence of cardiac pacemaker: Secondary | ICD-10-CM | POA: Diagnosis not present

## 2020-03-31 DIAGNOSIS — Z950 Presence of cardiac pacemaker: Secondary | ICD-10-CM | POA: Diagnosis not present

## 2020-03-31 DIAGNOSIS — R2681 Unsteadiness on feet: Secondary | ICD-10-CM | POA: Diagnosis not present

## 2020-03-31 DIAGNOSIS — I70203 Unspecified atherosclerosis of native arteries of extremities, bilateral legs: Secondary | ICD-10-CM | POA: Diagnosis not present

## 2020-03-31 DIAGNOSIS — I739 Peripheral vascular disease, unspecified: Secondary | ICD-10-CM | POA: Diagnosis not present

## 2020-03-31 DIAGNOSIS — I951 Orthostatic hypotension: Secondary | ICD-10-CM | POA: Diagnosis not present

## 2020-03-31 DIAGNOSIS — I495 Sick sinus syndrome: Secondary | ICD-10-CM | POA: Diagnosis not present

## 2020-04-07 DIAGNOSIS — E039 Hypothyroidism, unspecified: Secondary | ICD-10-CM | POA: Diagnosis not present

## 2020-04-07 DIAGNOSIS — I10 Essential (primary) hypertension: Secondary | ICD-10-CM | POA: Diagnosis not present

## 2020-04-07 DIAGNOSIS — N183 Chronic kidney disease, stage 3 unspecified: Secondary | ICD-10-CM | POA: Diagnosis not present

## 2020-04-07 DIAGNOSIS — E785 Hyperlipidemia, unspecified: Secondary | ICD-10-CM | POA: Diagnosis not present

## 2020-04-14 DIAGNOSIS — I495 Sick sinus syndrome: Secondary | ICD-10-CM | POA: Diagnosis not present

## 2020-04-14 DIAGNOSIS — R2681 Unsteadiness on feet: Secondary | ICD-10-CM | POA: Diagnosis not present

## 2020-04-14 DIAGNOSIS — I951 Orthostatic hypotension: Secondary | ICD-10-CM | POA: Diagnosis not present

## 2020-04-14 DIAGNOSIS — Z950 Presence of cardiac pacemaker: Secondary | ICD-10-CM | POA: Diagnosis not present

## 2020-04-14 DIAGNOSIS — I739 Peripheral vascular disease, unspecified: Secondary | ICD-10-CM | POA: Diagnosis not present

## 2020-04-14 DIAGNOSIS — I70203 Unspecified atherosclerosis of native arteries of extremities, bilateral legs: Secondary | ICD-10-CM | POA: Diagnosis not present

## 2020-04-24 ENCOUNTER — Other Ambulatory Visit: Payer: Self-pay

## 2020-04-24 MED ORDER — CILOSTAZOL 50 MG PO TABS: 50.0000 mg | ORAL_TABLET | Freq: Two times a day (BID) | ORAL | 11 refills | Status: DC

## 2020-04-24 MED ORDER — ATORVASTATIN CALCIUM 10 MG PO TABS: 10.0000 mg | ORAL_TABLET | Freq: Every day | ORAL | 11 refills | Status: DC

## 2020-05-19 ENCOUNTER — Ambulatory Visit (INDEPENDENT_AMBULATORY_CARE_PROVIDER_SITE_OTHER): Payer: Medicare HMO

## 2020-05-19 DIAGNOSIS — I495 Sick sinus syndrome: Secondary | ICD-10-CM | POA: Diagnosis not present

## 2020-05-19 LAB — CUP PACEART REMOTE DEVICE CHECK
Battery Voltage: 2.93 V
Brady Statistic AP VP Percent: 0.02 %
Brady Statistic AP VS Percent: 77.91 %
Brady Statistic AS VP Percent: 0 %
Brady Statistic AS VS Percent: 22.07 %
Brady Statistic RA Percent Paced: 77.71 %
Brady Statistic RV Percent Paced: 0.03 %
Date Time Interrogation Session: 20211026085916
Implantable Lead Implant Date: 20130729
Implantable Lead Implant Date: 20130729
Implantable Lead Location: 753859
Implantable Lead Location: 753860
Implantable Pulse Generator Implant Date: 20130729
Lead Channel Impedance Value: 408 Ohm
Lead Channel Impedance Value: 512 Ohm
Lead Channel Sensing Intrinsic Amplitude: 10.575 mV
Lead Channel Sensing Intrinsic Amplitude: 2.261 mV
Lead Channel Setting Pacing Amplitude: 2 V
Lead Channel Setting Pacing Amplitude: 2.5 V
Lead Channel Setting Pacing Pulse Width: 0.4 ms
Lead Channel Setting Sensing Sensitivity: 0.9 mV

## 2020-05-22 NOTE — Progress Notes (Signed)
Remote pacemaker transmission.   

## 2020-06-17 DIAGNOSIS — E039 Hypothyroidism, unspecified: Secondary | ICD-10-CM | POA: Diagnosis not present

## 2020-06-17 DIAGNOSIS — N183 Chronic kidney disease, stage 3 unspecified: Secondary | ICD-10-CM | POA: Diagnosis not present

## 2020-06-17 DIAGNOSIS — E785 Hyperlipidemia, unspecified: Secondary | ICD-10-CM | POA: Diagnosis not present

## 2020-06-17 DIAGNOSIS — I10 Essential (primary) hypertension: Secondary | ICD-10-CM | POA: Diagnosis not present

## 2020-07-09 DIAGNOSIS — F411 Generalized anxiety disorder: Secondary | ICD-10-CM | POA: Diagnosis not present

## 2020-07-10 DIAGNOSIS — Z23 Encounter for immunization: Secondary | ICD-10-CM | POA: Diagnosis not present

## 2020-08-04 DIAGNOSIS — Z961 Presence of intraocular lens: Secondary | ICD-10-CM | POA: Diagnosis not present

## 2020-08-04 DIAGNOSIS — H52209 Unspecified astigmatism, unspecified eye: Secondary | ICD-10-CM | POA: Diagnosis not present

## 2020-08-04 DIAGNOSIS — Z0100 Encounter for examination of eyes and vision without abnormal findings: Secondary | ICD-10-CM | POA: Diagnosis not present

## 2020-08-04 DIAGNOSIS — Z135 Encounter for screening for eye and ear disorders: Secondary | ICD-10-CM | POA: Diagnosis not present

## 2020-08-16 DIAGNOSIS — E785 Hyperlipidemia, unspecified: Secondary | ICD-10-CM | POA: Diagnosis not present

## 2020-08-16 DIAGNOSIS — E039 Hypothyroidism, unspecified: Secondary | ICD-10-CM | POA: Diagnosis not present

## 2020-08-16 DIAGNOSIS — I10 Essential (primary) hypertension: Secondary | ICD-10-CM | POA: Diagnosis not present

## 2020-08-16 DIAGNOSIS — N183 Chronic kidney disease, stage 3 unspecified: Secondary | ICD-10-CM | POA: Diagnosis not present

## 2020-08-18 ENCOUNTER — Ambulatory Visit (INDEPENDENT_AMBULATORY_CARE_PROVIDER_SITE_OTHER): Payer: Medicare HMO

## 2020-08-18 DIAGNOSIS — I495 Sick sinus syndrome: Secondary | ICD-10-CM | POA: Diagnosis not present

## 2020-08-18 LAB — CUP PACEART REMOTE DEVICE CHECK
Battery Voltage: 2.92 V
Brady Statistic AP VP Percent: 0.05 %
Brady Statistic AP VS Percent: 85.18 %
Brady Statistic AS VP Percent: 0 %
Brady Statistic AS VS Percent: 14.78 %
Brady Statistic RA Percent Paced: 84.9 %
Brady Statistic RV Percent Paced: 0.05 %
Date Time Interrogation Session: 20220125102737
Implantable Lead Implant Date: 20130729
Implantable Lead Implant Date: 20130729
Implantable Lead Location: 753859
Implantable Lead Location: 753860
Implantable Pulse Generator Implant Date: 20130729
Lead Channel Impedance Value: 400 Ohm
Lead Channel Impedance Value: 504 Ohm
Lead Channel Sensing Intrinsic Amplitude: 2.261 mV
Lead Channel Sensing Intrinsic Amplitude: 9.551 mV
Lead Channel Setting Pacing Amplitude: 2 V
Lead Channel Setting Pacing Amplitude: 2.5 V
Lead Channel Setting Pacing Pulse Width: 0.4 ms
Lead Channel Setting Sensing Sensitivity: 0.9 mV

## 2020-08-31 NOTE — Progress Notes (Signed)
Remote pacemaker transmission.   

## 2020-09-07 ENCOUNTER — Encounter: Payer: Medicare HMO | Admitting: Cardiovascular Disease

## 2020-10-05 ENCOUNTER — Encounter: Payer: Self-pay | Admitting: Cardiovascular Disease

## 2020-10-05 ENCOUNTER — Ambulatory Visit (INDEPENDENT_AMBULATORY_CARE_PROVIDER_SITE_OTHER): Payer: Medicare HMO | Admitting: Cardiovascular Disease

## 2020-10-05 ENCOUNTER — Other Ambulatory Visit: Payer: Self-pay

## 2020-10-05 VITALS — BP 142/64 | HR 64 | Ht 63.0 in | Wt 123.6 lb

## 2020-10-05 DIAGNOSIS — I495 Sick sinus syndrome: Secondary | ICD-10-CM

## 2020-10-05 DIAGNOSIS — I872 Venous insufficiency (chronic) (peripheral): Secondary | ICD-10-CM

## 2020-10-05 DIAGNOSIS — I10 Essential (primary) hypertension: Secondary | ICD-10-CM

## 2020-10-05 DIAGNOSIS — I739 Peripheral vascular disease, unspecified: Secondary | ICD-10-CM | POA: Diagnosis not present

## 2020-10-05 DIAGNOSIS — I471 Supraventricular tachycardia: Secondary | ICD-10-CM

## 2020-10-05 DIAGNOSIS — R2681 Unsteadiness on feet: Secondary | ICD-10-CM

## 2020-10-05 DIAGNOSIS — E7800 Pure hypercholesterolemia, unspecified: Secondary | ICD-10-CM

## 2020-10-05 DIAGNOSIS — Z950 Presence of cardiac pacemaker: Secondary | ICD-10-CM

## 2020-10-05 DIAGNOSIS — I951 Orthostatic hypotension: Secondary | ICD-10-CM

## 2020-10-05 NOTE — Patient Instructions (Signed)

## 2020-10-05 NOTE — Progress Notes (Unsigned)
Patient ID: Katelyn Morris, female   DOB: [birth year], 87 y.o.   MRN: [id number]    Cardiology Office Note    Date:  10/06/2020   ID:  Katelyn, Morris 1922/07/29, MRN [id number]  PCP:  Lawerance Cruel, MD  Cardiologist:   Sanda Klein, MD   Chief Complaint  Patient presents with  . PAD  . Pacemaker Check    History of Present Illness:  Katelyn Morris is a 87 y.o. female with history of sinus node dysfunction and pacemaker implantation (Medtronic Revo MRI conditional 2013), paroxysmal atrial tachycardia, essential hypertension and severe orthostatic hypotension, mild PAD.  Her last appointment without any syncope, dizziness or other symptoms of orthostatic hypotension.  She is wearing the compression stockings, which also help with her lower extremity edema and her varicose veins.  Her typical blood pressure is 130-140/50-60s.  She is maintaining a healthy weight with a BMI around 22.  She continues to have an unsteady gait and her family asked for a prescription for rolling walker.  She denies orthopnea, PND, exertional dyspnea, change in the chronic pattern of mild lower extremity edema, angina at rest or with activity or intermittent claudication, despite the fact that she has documented occlusion of the L posterior tibial (biphasic flow in the other two infrapopliteal branches).  Pacemaker interrogation shows normal device function, although the battery is getting closer to ERI (Medtronic Revo 2013, voltage 2.92 V, ERI at 2.81 V).  Lead parameters are good.  She has 85% atrial pacing with good heart rate histogram and does not require ventricular pacing.  She has occasional episodes of supraventricular tachycardia lasting for a maximum of about 5 minutes (most episodes are less than a minute in duration), at around 160 bpm.  Initiation is often triggered by PVC, suggesting AV node reentry.  She is unaware of the arrhythmia.  Her Medtronic Revo device was implanted in 2013.  Device function is normal.  Her last pacemaker download battery voltage is 2.95 V (RRT 2.81 V).  Lead parameters are normal.  She has virtually 100% atrial pacing and virtually never requires ventricular pacing.  The heart rate histogram distribution is blunted but she is very sedentary.  She has occasional episodes of atrial tachycardia with 1: 1 AV conduction.  Overall burden remains very low at under 0.1%.  As has happened in the past, when I attempted to check ventricular pacing thresholds today, brief bursts of atrial tachycardia were induced, suggesting an AV node dependent mechanism.  She has normal left ventricular systolic function and aortic valve sclerosis explaining her systolic murmur. Her right carotid bruit was not associated with any significant obstruction by ultrasonography. A remote nuclear stress test did not show perfusion abnormalities.     Past Medical History:  Diagnosis Date  . Arthritis    in knees per patient  . Dysrhythmia    Bradycardia  . Heart murmur   . Hypertension   . Hypothyroidism   . Pacemaker 02/20/2012   dual chamber  . SSS (sick sinus syndrome) ([place]) 08/06/2014    Past Surgical History:  Procedure Laterality Date  . ABDOMINAL HYSTERECTOMY    . bladder tack    . INSERT / REPLACE / REMOVE PACEMAKER    . PERMANENT PACEMAKER INSERTION N/A 02/20/2012   Procedure: PERMANENT PACEMAKER INSERTION;  Surgeon: Sanda Klein, MD;  Location: [facility] CATH [facility];  Service: Cardiovascular;  Laterality: N/A;  . ROTATOR CUFF REPAIR     bilateral    Outpatient Medications  Prior to Visit  Medication Sig Dispense Refill  . ALPRAZolam (XANAX) 0.5 MG tablet Take 0.25 mg by mouth at bedtime as needed.    Marland Kitchen aspirin 81 MG tablet Take 81 mg by mouth daily.    Marland Kitchen atorvastatin (LIPITOR) 10 MG tablet Take 1 tablet (10 mg total) by mouth daily. 30 tablet 11  . Cholecalciferol (VITAMIN D3 PO) Take 2,000 Units by mouth daily.     . cilostazol (PLETAL) 50 MG tablet Take 1 tablet (50 mg  total) by mouth 2 (two) times daily. 60 tablet 11  . hydrochlorothiazide (HYDRODIURIL) 12.5 MG tablet Take 1 tablet (12.5 mg total) by mouth daily. 90 tablet 3  . levothyroxine (SYNTHROID, LEVOTHROID) 50 MCG tablet Take 50 mcg by mouth daily.    . metoprolol succinate (TOPROL-XL) 25 MG 24 hr tablet Take 1 tablet (25 mg total) by mouth daily. Take with or immediately following a meal. 90 tablet 3   No facility-administered medications prior to visit.     Allergies:   Sulfa antibiotics and Sulfonamide derivatives   Social History   Socioeconomic History  . Marital status: Widowed    Spouse name: Not on file  . Number of children: Not on file  . Years of education: Not on file  . Highest education level: Not on file  Occupational History  . Not on file  Tobacco Use  . Smoking status: Never Smoker  . Smokeless tobacco: Never Used  Substance and Sexual Activity  . Alcohol use: No  . Drug use: No  . Sexual activity: Never    Birth control/protection: Post-menopausal  Other Topics Concern  . Not on file  Social History Narrative  . Not on file   Social Determinants of Health   Financial Resource Strain: Not on file  Food Insecurity: Not on file  Transportation Needs: Not on file  Physical Activity: Not on file  Stress: Not on file  Social Connections: Not on file      ROS:   Please see the history of present illness.   All other systems are reviewed and are negative.   PHYSICAL EXAM:   VS:  BP (!) 142/64   Pulse 64   Ht 5\' 3"  (1.6 m)   Wt 123 lb 9.6 oz (56.1 kg)   SpO2 98%   BMI 21.89 kg/m     General: Alert, oriented x3, no distress, appears younger than stated age.  Healthy right subclavian pacemaker site. Head: no evidence of trauma, PERRL, EOMI, no exophtalmos or lid lag, no myxedema, no xanthelasma; normal ears, nose and oropharynx Neck: normal jugular venous pulsations and no hepatojugular reflux; brisk carotid pulses without delay and no carotid  bruits Chest: clear to auscultation, no signs of consolidation by percussion or palpation, normal fremitus, symmetrical and full respiratory excursions Cardiovascular: normal position and quality of the apical impulse, regular rhythm, normal first and second heart sounds, 2/6 early to mid peaking systolic ejection murmur radiating from the aortic focus to the left lower sternal border, no diastolic murmurs, rubs or gallops Abdomen: no tenderness or distention, no masses by palpation, no abnormal pulsatility or arterial bruits, normal bowel sounds, no hepatosplenomegaly Extremities: no clubbing, cyanosis or edema, wearing compression stockings, a few superficial varicose veins; 2+ radial, ulnar and brachial pulses bilaterally; 2+ right femoral, posterior tibial and dorsalis pedis pulses; 2+ left femoral, posterior tibial and dorsalis pedis pulses; no subclavian or femoral bruits Neurological: grossly nonfocal Psych: Normal mood and affect   Wt Readings from  Last 3 Encounters:  10/05/20 123 lb 9.6 oz (56.1 kg)  02/14/20 119 lb (54 kg)  01/03/20 109 lb (49.4 kg)    Studies/Labs Reviewed:   EKG: Ordered today, shows atrial paced, ventricular sensed rhythm with QS pattern in lead V1-V2 (old), otherwise normal, QTC 381 ms. Lipid Panel    Component Value Date/Time   CHOL 121 10/29/2019 1132   TRIG 73 10/29/2019 1132   HDL 54 10/29/2019 1132   CHOLHDL 2.2 10/29/2019 1132   CHOLHDL 4.6 08/05/2014 0922   VLDL 40 08/05/2014 0922   LDLCALC 52 10/29/2019 1132    03/14/2019 total cholesterol 215, LDL 137, HDL 39, triglycerides 196  Hemoglobin 13.2, creatinine 0.94, potassium 4.7  10/29/2019 Total cholesterol 121, HDL 54, triglycerides 73, LDL 60 Creatinine 1.05, potassium 4.6, normal liver function test, TSH 1.39  ASSESSMENT:    1. PAD (peripheral artery disease) ([place])   2. SSS (sick sinus syndrome) ([facility])   3. Paroxysmal SVT (supraventricular tachycardia) ([place])   4. Pacemaker - Medtronic  Revo, dual chamber 2013   5. Essential hypertension   6. Orthostatic hypotension   7. Peripheral venous insufficiency   8. Hypercholesterolemia   9. Gait instability      PLAN:  In order of problems listed above:  1. PAD: Denies intermittent claudication.  Continue cilostazol and lipid-lowering therapy. 2. SVT: She continues to have brief asymptomatic episodes of sustained atrial tachycardia, most likely AV node reentry since they appear to be triggered by PVC related long short sequences.  The longest episode is only 5 minutes in duration and she appears to be consistently unaware of these events. 3. SSS: The atrial paced rhythm, heart rate histogram appears greatly improved compared to her last pacemaker check. 4. PM: Normal device function.  She has been compliant with remote downloads every 3 months.  We will bring her back in a year for an office check. 5. HTN: She has documented to have drops in blood pressure of about 35 mmHg.  Allow systolic blood pressure to reach 150-160 to avoid syncope. 6. Orthostatic hypotension: Seems to be doing better since she is consistently wearing the compression stockings. 7. Peripheral venous insufficiency: Dominant reason for her lower extremity edema, improved with compression stockings. 8. HLP: All lipid parameters in desirable range on statin. 9. Gait instability: Recommended a rolling walker, given prescription.    Medication Adjustments/Labs and Tests Ordered: Current medicines are reviewed at length with the patient today.  Concerns regarding medicines are outlined above.  Medication changes, Labs and Tests ordered today are listed in the Patient Instructions below. Patient Instructions  Medication Instructions:  No changes *If you need a refill on your cardiac medications before your next appointment, please call your pharmacy*   Lab Work: None ordered If you have labs (blood work) drawn today and your tests are completely normal, you  will receive your results only by: Marland Kitchen MyChart Message (if you have MyChart) OR . A paper copy in the mail If you have any lab test that is abnormal or we need to change your treatment, we will call you to review the results.   Testing/Procedures:  None ordered   Follow-Up: At [facility], you and your health needs are our priority.  As part of our continuing mission to provide you with exceptional heart care, we have created designated Provider Care Teams.  These Care Teams include your primary Cardiologist (physician) and Advanced Practice Providers (APPs -  Physician Assistants and Nurse Practitioners) who all work  together to provide you with the care you need, when you need it.  We recommend signing up for the patient portal called "MyChart".  Sign up information is provided on this After Visit Summary.  MyChart is used to connect with patients for Virtual Visits (Telemedicine).  Patients are able to view lab/test results, encounter notes, upcoming appointments, etc.  Non-urgent messages can be sent to your provider as well.   To learn more about what you can do with MyChart, go to [email or web address].    Your next appointment:   12 month(s)  The format for your next appointment:   In Person  Provider:   Sanda Klein, MD     Signed, Sanda Klein, MD  10/06/2020 1:33 PM    [address], [place], [place]  [address] Phone: [phone]; Fax: [phone]

## 2020-10-06 ENCOUNTER — Encounter: Payer: Self-pay | Admitting: Cardiovascular Disease

## 2020-10-07 DIAGNOSIS — R262 Difficulty in walking, not elsewhere classified: Secondary | ICD-10-CM | POA: Diagnosis not present

## 2020-11-02 DIAGNOSIS — Z01419 Encounter for gynecological examination (general) (routine) without abnormal findings: Secondary | ICD-10-CM | POA: Diagnosis not present

## 2020-11-06 ENCOUNTER — Other Ambulatory Visit: Payer: Self-pay

## 2020-11-06 MED ORDER — METOPROLOL SUCCINATE ER 25 MG PO TB24: 25.0000 mg | ORAL_TABLET | Freq: Every day | ORAL | 3 refills | Status: DC

## 2020-11-17 ENCOUNTER — Ambulatory Visit (INDEPENDENT_AMBULATORY_CARE_PROVIDER_SITE_OTHER): Payer: Medicare HMO

## 2020-11-17 DIAGNOSIS — I495 Sick sinus syndrome: Secondary | ICD-10-CM | POA: Diagnosis not present

## 2020-11-17 LAB — CUP PACEART REMOTE DEVICE CHECK
Battery Voltage: 2.92 V
Brady Statistic AP VP Percent: 0.03 %
Brady Statistic AP VS Percent: 87.71 %
Brady Statistic AS VP Percent: 0 %
Brady Statistic AS VS Percent: 12.26 %
Brady Statistic RA Percent Paced: 87.55 %
Brady Statistic RV Percent Paced: 0.04 %
Date Time Interrogation Session: 20220426075605
Implantable Lead Implant Date: 20130729
Implantable Lead Implant Date: 20130729
Implantable Lead Location: 753859
Implantable Lead Location: 753860
Implantable Pulse Generator Implant Date: 20130729
Lead Channel Impedance Value: 408 Ohm
Lead Channel Impedance Value: 576 Ohm
Lead Channel Sensing Intrinsic Amplitude: 2.608 mV
Lead Channel Sensing Intrinsic Amplitude: 9.21 mV
Lead Channel Setting Pacing Amplitude: 2 V
Lead Channel Setting Pacing Amplitude: 2.5 V
Lead Channel Setting Pacing Pulse Width: 0.4 ms
Lead Channel Setting Sensing Sensitivity: 0.9 mV

## 2020-11-19 ENCOUNTER — Other Ambulatory Visit: Payer: Self-pay | Admitting: Cardiovascular Disease

## 2020-12-08 NOTE — Progress Notes (Signed)
Remote pacemaker transmission.   

## 2021-01-11 ENCOUNTER — Other Ambulatory Visit: Payer: Self-pay | Admitting: Cardiovascular Disease

## 2021-01-15 DIAGNOSIS — I10 Essential (primary) hypertension: Secondary | ICD-10-CM | POA: Diagnosis not present

## 2021-01-15 DIAGNOSIS — E785 Hyperlipidemia, unspecified: Secondary | ICD-10-CM | POA: Diagnosis not present

## 2021-01-15 DIAGNOSIS — E039 Hypothyroidism, unspecified: Secondary | ICD-10-CM | POA: Diagnosis not present

## 2021-01-15 DIAGNOSIS — N183 Chronic kidney disease, stage 3 unspecified: Secondary | ICD-10-CM | POA: Diagnosis not present

## 2021-01-19 ENCOUNTER — Telehealth: Payer: Self-pay | Admitting: Cardiovascular Disease

## 2021-01-19 NOTE — Telephone Encounter (Signed)
Will keep eyes open for new download

## 2021-01-19 NOTE — Telephone Encounter (Addendum)
Returned call to daughter in law (ok per DPR)-she was wondering when Losartan was discontinue, she could not remember why or when.  Per chart review-Losartan d/c 02/27/2019 (phone note) due to bp dropping when she stands up.    She also reports patient having a random "episode" the beginning of June when she was out of town.   She states her HR became elevated 120-130, patient felt like she was going to pass out. No syncope or LOC.   She put cold compresses on her and sat down to rest and it "calmed down", unsure duration.     No episodes since.   She was wondering if we could see her remote transmission.  Advised last transmission 11/17/2020, next was in 91 days.    Transmission 10/2020:         Remote reviewed.   Not pacemaker dependent. Battery status is good. Lead measurements are stable. Heart rate histogram is favorable. Occasional sustained atrial tachycardia, up to 13 minute episodes.   Advised will send to device clinic, can do transmission.  She is aware and verbalized understanding.

## 2021-01-19 NOTE — Telephone Encounter (Signed)
New Message     Daughter is calling to see if pt is tstill on Losartan?

## 2021-01-20 NOTE — Telephone Encounter (Signed)
Successful telephone encounter to Katelyn Morris and daughter in law Katelyn Morris (on [place]) to follow up on patient's symptomatic tachycardia and to provide assistance with manual transmission. 15 Fast A/V episodes noted since last interrogation 11/17/20 with most recent 01/16/21. Fastest A/V 174/175, and longest 4 min 20 sec. 4 NSVT episodes noted however appears to be 1:1 with max A/V rate 185/176. Per daughter in law, patient is compliant with Pletal and Metoprolol as prescribed. Patient unaware of most episodes and states she only felt "my heart racing 3 weeks ago" however was unsure of date and time. Will route transmission to Dr. Sallyanne Kuster for review.

## 2021-01-21 NOTE — Telephone Encounter (Signed)
Left a message for the patient to call back.  Per Dr. Sallyanne Kuster: Please ask her to increase the metoprolol succinate to 37.5 mg daily

## 2021-01-26 DIAGNOSIS — E039 Hypothyroidism, unspecified: Secondary | ICD-10-CM | POA: Diagnosis not present

## 2021-01-26 DIAGNOSIS — N183 Chronic kidney disease, stage 3 unspecified: Secondary | ICD-10-CM | POA: Diagnosis not present

## 2021-01-26 DIAGNOSIS — E785 Hyperlipidemia, unspecified: Secondary | ICD-10-CM | POA: Diagnosis not present

## 2021-01-26 DIAGNOSIS — I10 Essential (primary) hypertension: Secondary | ICD-10-CM | POA: Diagnosis not present

## 2021-01-26 DIAGNOSIS — I739 Peripheral vascular disease, unspecified: Secondary | ICD-10-CM | POA: Diagnosis not present

## 2021-01-26 DIAGNOSIS — Z0000 Encounter for general adult medical examination without abnormal findings: Secondary | ICD-10-CM | POA: Diagnosis not present

## 2021-01-26 DIAGNOSIS — F411 Generalized anxiety disorder: Secondary | ICD-10-CM | POA: Diagnosis not present

## 2021-01-27 MED ORDER — METOPROLOL SUCCINATE ER 25 MG PO TB24: 37.5000 mg | ORAL_TABLET | Freq: Every day | ORAL | 1 refills | Status: DC

## 2021-01-27 NOTE — Telephone Encounter (Signed)
Patient's daughter in law has been made aware. New prescription has been sent in.

## 2021-02-16 ENCOUNTER — Ambulatory Visit (INDEPENDENT_AMBULATORY_CARE_PROVIDER_SITE_OTHER): Payer: Medicare HMO

## 2021-02-16 DIAGNOSIS — I495 Sick sinus syndrome: Secondary | ICD-10-CM

## 2021-02-16 LAB — CUP PACEART REMOTE DEVICE CHECK
Battery Voltage: 2.9 V
Brady Statistic AP VP Percent: 0.06 %
Brady Statistic AP VS Percent: 90.02 %
Brady Statistic AS VP Percent: 0.01 %
Brady Statistic AS VS Percent: 9.91 %
Brady Statistic RA Percent Paced: 89.67 %
Brady Statistic RV Percent Paced: 0.08 %
Date Time Interrogation Session: 20220726093918
Implantable Lead Implant Date: 20130729
Implantable Lead Implant Date: 20130729
Implantable Lead Location: 753859
Implantable Lead Location: 753860
Implantable Pulse Generator Implant Date: 20130729
Lead Channel Impedance Value: 400 Ohm
Lead Channel Impedance Value: 552 Ohm
Lead Channel Sensing Intrinsic Amplitude: 2.261 mV
Lead Channel Sensing Intrinsic Amplitude: 9.551 mV
Lead Channel Setting Pacing Amplitude: 2 V
Lead Channel Setting Pacing Amplitude: 2.5 V
Lead Channel Setting Pacing Pulse Width: 0.4 ms
Lead Channel Setting Sensing Sensitivity: 0.9 mV

## 2021-03-12 DIAGNOSIS — E785 Hyperlipidemia, unspecified: Secondary | ICD-10-CM | POA: Diagnosis not present

## 2021-03-12 DIAGNOSIS — N183 Chronic kidney disease, stage 3 unspecified: Secondary | ICD-10-CM | POA: Diagnosis not present

## 2021-03-12 DIAGNOSIS — I10 Essential (primary) hypertension: Secondary | ICD-10-CM | POA: Diagnosis not present

## 2021-03-12 DIAGNOSIS — E039 Hypothyroidism, unspecified: Secondary | ICD-10-CM | POA: Diagnosis not present

## 2021-03-15 NOTE — Progress Notes (Signed)
Remote pacemaker transmission.   

## 2021-03-19 DIAGNOSIS — H9190 Unspecified hearing loss, unspecified ear: Secondary | ICD-10-CM | POA: Diagnosis not present

## 2021-03-19 DIAGNOSIS — H9319 Tinnitus, unspecified ear: Secondary | ICD-10-CM | POA: Diagnosis not present

## 2021-03-19 DIAGNOSIS — R413 Other amnesia: Secondary | ICD-10-CM | POA: Diagnosis not present

## 2021-03-19 DIAGNOSIS — F411 Generalized anxiety disorder: Secondary | ICD-10-CM | POA: Diagnosis not present

## 2021-04-17 ENCOUNTER — Other Ambulatory Visit: Payer: Self-pay | Admitting: Cardiovascular Disease

## 2021-05-17 DIAGNOSIS — N183 Chronic kidney disease, stage 3 unspecified: Secondary | ICD-10-CM | POA: Diagnosis not present

## 2021-05-17 DIAGNOSIS — E039 Hypothyroidism, unspecified: Secondary | ICD-10-CM | POA: Diagnosis not present

## 2021-05-17 DIAGNOSIS — I10 Essential (primary) hypertension: Secondary | ICD-10-CM | POA: Diagnosis not present

## 2021-05-17 DIAGNOSIS — E785 Hyperlipidemia, unspecified: Secondary | ICD-10-CM | POA: Diagnosis not present

## 2021-05-18 ENCOUNTER — Telehealth: Payer: Self-pay

## 2021-05-18 NOTE — Telephone Encounter (Signed)
The patient phone was making a noise and she could not hear me. I told her I will call her back.

## 2021-05-18 NOTE — Telephone Encounter (Signed)
Received voice mail message from patient stating she was having trouble sending remote transmission.  She reports monitor is not working.  Pt is not currently enrolled in monthly ICM follow up.  Message sent to device clinic triage to follow up with patient.

## 2021-05-20 NOTE — Telephone Encounter (Signed)
I spoke with the patient and called [employer] tech support with the patient. They are sending her a new handheld. She should receive it in 3-5 weeks because the handheld is on backorder.

## 2021-07-21 DIAGNOSIS — E785 Hyperlipidemia, unspecified: Secondary | ICD-10-CM | POA: Diagnosis not present

## 2021-07-21 DIAGNOSIS — I10 Essential (primary) hypertension: Secondary | ICD-10-CM | POA: Diagnosis not present

## 2021-07-21 DIAGNOSIS — E039 Hypothyroidism, unspecified: Secondary | ICD-10-CM | POA: Diagnosis not present

## 2021-07-21 DIAGNOSIS — N183 Chronic kidney disease, stage 3 unspecified: Secondary | ICD-10-CM | POA: Diagnosis not present

## 2021-07-22 ENCOUNTER — Other Ambulatory Visit: Payer: Self-pay | Admitting: Cardiovascular Disease

## 2021-08-10 DIAGNOSIS — F411 Generalized anxiety disorder: Secondary | ICD-10-CM | POA: Diagnosis not present

## 2021-08-10 DIAGNOSIS — I10 Essential (primary) hypertension: Secondary | ICD-10-CM | POA: Diagnosis not present

## 2021-08-17 ENCOUNTER — Ambulatory Visit (INDEPENDENT_AMBULATORY_CARE_PROVIDER_SITE_OTHER): Payer: Medicare HMO

## 2021-08-17 DIAGNOSIS — I495 Sick sinus syndrome: Secondary | ICD-10-CM

## 2021-08-17 LAB — CUP PACEART REMOTE DEVICE CHECK
Battery Voltage: 2.89 V
Brady Statistic AP VP Percent: 0.07 %
Brady Statistic AP VS Percent: 95.4 %
Brady Statistic AS VP Percent: 0 %
Brady Statistic AS VS Percent: 4.52 %
Brady Statistic RA Percent Paced: 95.05 %
Brady Statistic RV Percent Paced: 0.07 %
Date Time Interrogation Session: 20230124095730
Implantable Lead Implant Date: 20130729
Implantable Lead Implant Date: 20130729
Implantable Lead Location: 753859
Implantable Lead Location: 753860
Implantable Pulse Generator Implant Date: 20130729
Lead Channel Impedance Value: 400 Ohm
Lead Channel Impedance Value: 496 Ohm
Lead Channel Sensing Intrinsic Amplitude: 2.217 mV
Lead Channel Sensing Intrinsic Amplitude: 9.892 mV
Lead Channel Setting Pacing Amplitude: 2 V
Lead Channel Setting Pacing Amplitude: 2.5 V
Lead Channel Setting Pacing Pulse Width: 0.4 ms
Lead Channel Setting Sensing Sensitivity: 0.9 mV

## 2021-08-26 ENCOUNTER — Emergency Department (HOSPITAL_COMMUNITY): Payer: Medicare HMO

## 2021-08-26 ENCOUNTER — Inpatient Hospital Stay (HOSPITAL_COMMUNITY)
Admission: EM | Admit: 2021-08-26 | Discharge: 2021-09-03 | DRG: 521 | Disposition: A | Discharge status: Skilled Nursing Facility | Payer: Medicare HMO | Attending: Internal Medicine | Admitting: Internal Medicine

## 2021-08-26 ENCOUNTER — Other Ambulatory Visit: Payer: Self-pay

## 2021-08-26 DIAGNOSIS — Z471 Aftercare following joint replacement surgery: Secondary | ICD-10-CM | POA: Diagnosis not present

## 2021-08-26 DIAGNOSIS — I495 Sick sinus syndrome: Secondary | ICD-10-CM | POA: Diagnosis not present

## 2021-08-26 DIAGNOSIS — W19XXXA Unspecified fall, initial encounter: Secondary | ICD-10-CM

## 2021-08-26 DIAGNOSIS — E039 Hypothyroidism, unspecified: Secondary | ICD-10-CM | POA: Diagnosis present

## 2021-08-26 DIAGNOSIS — R410 Disorientation, unspecified: Secondary | ICD-10-CM | POA: Diagnosis not present

## 2021-08-26 DIAGNOSIS — I739 Peripheral vascular disease, unspecified: Secondary | ICD-10-CM | POA: Diagnosis present

## 2021-08-26 DIAGNOSIS — I10 Essential (primary) hypertension: Secondary | ICD-10-CM | POA: Diagnosis not present

## 2021-08-26 DIAGNOSIS — Z79899 Other long term (current) drug therapy: Secondary | ICD-10-CM | POA: Diagnosis not present

## 2021-08-26 DIAGNOSIS — E876 Hypokalemia: Secondary | ICD-10-CM | POA: Diagnosis present

## 2021-08-26 DIAGNOSIS — D62 Acute posthemorrhagic anemia: Secondary | ICD-10-CM | POA: Diagnosis not present

## 2021-08-26 DIAGNOSIS — B9620 Unspecified Escherichia coli [E. coli] as the cause of diseases classified elsewhere: Secondary | ICD-10-CM | POA: Diagnosis present

## 2021-08-26 DIAGNOSIS — Z888 Allergy status to other drugs, medicaments and biological substances status: Secondary | ICD-10-CM

## 2021-08-26 DIAGNOSIS — Z96642 Presence of left artificial hip joint: Secondary | ICD-10-CM | POA: Diagnosis not present

## 2021-08-26 DIAGNOSIS — G319 Degenerative disease of nervous system, unspecified: Secondary | ICD-10-CM | POA: Diagnosis not present

## 2021-08-26 DIAGNOSIS — Z882 Allergy status to sulfonamides status: Secondary | ICD-10-CM

## 2021-08-26 DIAGNOSIS — R937 Abnormal findings on diagnostic imaging of other parts of musculoskeletal system: Secondary | ICD-10-CM | POA: Diagnosis present

## 2021-08-26 DIAGNOSIS — F419 Anxiety disorder, unspecified: Secondary | ICD-10-CM | POA: Diagnosis present

## 2021-08-26 DIAGNOSIS — Z66 Do not resuscitate: Secondary | ICD-10-CM | POA: Diagnosis not present

## 2021-08-26 DIAGNOSIS — S72002A Fracture of unspecified part of neck of left femur, initial encounter for closed fracture: Principal | ICD-10-CM | POA: Diagnosis present

## 2021-08-26 DIAGNOSIS — E7800 Pure hypercholesterolemia, unspecified: Secondary | ICD-10-CM | POA: Diagnosis present

## 2021-08-26 DIAGNOSIS — K5900 Constipation, unspecified: Secondary | ICD-10-CM | POA: Diagnosis present

## 2021-08-26 DIAGNOSIS — M25552 Pain in left hip: Secondary | ICD-10-CM | POA: Diagnosis not present

## 2021-08-26 DIAGNOSIS — J984 Other disorders of lung: Secondary | ICD-10-CM | POA: Diagnosis not present

## 2021-08-26 DIAGNOSIS — N183 Chronic kidney disease, stage 3 unspecified: Secondary | ICD-10-CM | POA: Diagnosis not present

## 2021-08-26 DIAGNOSIS — W010XXA Fall on same level from slipping, tripping and stumbling without subsequent striking against object, initial encounter: Secondary | ICD-10-CM | POA: Diagnosis present

## 2021-08-26 DIAGNOSIS — Z79890 Hormone replacement therapy (postmenopausal): Secondary | ICD-10-CM | POA: Diagnosis not present

## 2021-08-26 DIAGNOSIS — N390 Urinary tract infection, site not specified: Secondary | ICD-10-CM | POA: Diagnosis present

## 2021-08-26 DIAGNOSIS — R011 Cardiac murmur, unspecified: Secondary | ICD-10-CM | POA: Diagnosis not present

## 2021-08-26 DIAGNOSIS — I129 Hypertensive chronic kidney disease with stage 1 through stage 4 chronic kidney disease, or unspecified chronic kidney disease: Secondary | ICD-10-CM | POA: Diagnosis not present

## 2021-08-26 DIAGNOSIS — S7292XA Unspecified fracture of left femur, initial encounter for closed fracture: Secondary | ICD-10-CM

## 2021-08-26 DIAGNOSIS — N1831 Chronic kidney disease, stage 3a: Secondary | ICD-10-CM | POA: Diagnosis not present

## 2021-08-26 DIAGNOSIS — R079 Chest pain, unspecified: Secondary | ICD-10-CM | POA: Diagnosis not present

## 2021-08-26 DIAGNOSIS — Z7401 Bed confinement status: Secondary | ICD-10-CM | POA: Diagnosis not present

## 2021-08-26 DIAGNOSIS — Z419 Encounter for procedure for purposes other than remedying health state, unspecified: Secondary | ICD-10-CM

## 2021-08-26 DIAGNOSIS — Z7982 Long term (current) use of aspirin: Secondary | ICD-10-CM | POA: Diagnosis not present

## 2021-08-26 DIAGNOSIS — Z950 Presence of cardiac pacemaker: Secondary | ICD-10-CM

## 2021-08-26 DIAGNOSIS — S72002D Fracture of unspecified part of neck of left femur, subsequent encounter for closed fracture with routine healing: Secondary | ICD-10-CM | POA: Diagnosis not present

## 2021-08-26 DIAGNOSIS — R220 Localized swelling, mass and lump, head: Secondary | ICD-10-CM | POA: Diagnosis not present

## 2021-08-26 DIAGNOSIS — Z20822 Contact with and (suspected) exposure to covid-19: Secondary | ICD-10-CM | POA: Diagnosis not present

## 2021-08-26 DIAGNOSIS — I959 Hypotension, unspecified: Secondary | ICD-10-CM | POA: Diagnosis not present

## 2021-08-26 DIAGNOSIS — Y92009 Unspecified place in unspecified non-institutional (private) residence as the place of occurrence of the external cause: Secondary | ICD-10-CM | POA: Diagnosis not present

## 2021-08-26 DIAGNOSIS — W19XXXD Unspecified fall, subsequent encounter: Secondary | ICD-10-CM | POA: Diagnosis not present

## 2021-08-26 DIAGNOSIS — M47812 Spondylosis without myelopathy or radiculopathy, cervical region: Secondary | ICD-10-CM | POA: Diagnosis not present

## 2021-08-26 DIAGNOSIS — G9341 Metabolic encephalopathy: Secondary | ICD-10-CM | POA: Diagnosis not present

## 2021-08-26 DIAGNOSIS — I6782 Cerebral ischemia: Secondary | ICD-10-CM | POA: Diagnosis not present

## 2021-08-26 LAB — COMPREHENSIVE METABOLIC PANEL
ALT: 27 U/L (ref 0–44)
AST: 27 U/L (ref 15–41)
Albumin: 3.5 g/dL (ref 3.5–5.0)
Alkaline Phosphatase: 58 U/L (ref 38–126)
Anion gap: 9 (ref 5–15)
BUN: 21 mg/dL (ref 8–23)
CO2: 26 mmol/L (ref 22–32)
Calcium: 9.2 mg/dL (ref 8.9–10.3)
Chloride: 105 mmol/L (ref 98–111)
Creatinine, Ser: 0.92 mg/dL (ref 0.44–1.00)
GFR, Estimated: 57 mL/min — ABNORMAL LOW (ref >60)
Glucose, Bld: 105 mg/dL — ABNORMAL HIGH (ref 70–99)
Potassium: 4 mmol/L (ref 3.5–5.1)
Sodium: 140 mmol/L (ref 135–145)
Total Bilirubin: 1.1 mg/dL (ref 0.3–1.2)
Total Protein: 6 g/dL — ABNORMAL LOW (ref 6.5–8.1)

## 2021-08-26 LAB — CBC WITH DIFFERENTIAL/PLATELET
Abs Immature Granulocytes: 0.05 10*3/uL (ref 0.00–0.07)
Basophils Absolute: 0 10*3/uL (ref 0.0–0.1)
Basophils Relative: 0 %
Eosinophils Absolute: 0 10*3/uL (ref 0.0–0.5)
Eosinophils Relative: 0 %
HCT: 38.6 % (ref 36.0–46.0)
Hemoglobin: 12.6 g/dL (ref 12.0–15.0)
Immature Granulocytes: 1 %
Lymphocytes Relative: 8 %
Lymphs Abs: 0.8 10*3/uL (ref 0.7–4.0)
MCH: 29.9 pg (ref 26.0–34.0)
MCHC: 32.6 g/dL (ref 30.0–36.0)
MCV: 91.5 fL (ref 80.0–100.0)
Monocytes Absolute: 0.6 10*3/uL (ref 0.1–1.0)
Monocytes Relative: 6 %
Neutro Abs: 8.8 10*3/uL — ABNORMAL HIGH (ref 1.7–7.7)
Neutrophils Relative %: 85 %
Platelets: 184 10*3/uL (ref 150–400)
RBC: 4.22 MIL/uL (ref 3.87–5.11)
RDW: 12.9 % (ref 11.5–15.5)
WBC: 10.3 10*3/uL (ref 4.0–10.5)
nRBC: 0 % (ref 0.0–0.2)

## 2021-08-26 LAB — PROTIME-INR
INR: 1.1 (ref 0.8–1.2)
Prothrombin Time: 14.5 seconds (ref 11.4–15.2)

## 2021-08-26 MED ORDER — ONDANSETRON HCL 4 MG/2ML IJ SOLN
4.0000 mg | Freq: Four times a day (QID) | INTRAMUSCULAR | Status: DC | PRN
  Administered 2021-08-26: 4 mg via INTRAVENOUS
  Filled 2021-08-26: qty 2

## 2021-08-26 MED ORDER — ATORVASTATIN CALCIUM 10 MG PO TABS
10.0000 mg | ORAL_TABLET | Freq: Every day | ORAL | Status: DC
  Administered 2021-08-28 – 2021-08-31 (×4): 10 mg via ORAL
  Filled 2021-08-26 (×5): qty 1

## 2021-08-26 MED ORDER — HYDROCHLOROTHIAZIDE 12.5 MG PO TABS: 12.5000 mg | ORAL_TABLET | Freq: Every day | ORAL | Status: DC

## 2021-08-26 MED ORDER — OXYCODONE HCL 5 MG PO TABS
5.0000 mg | ORAL_TABLET | Freq: Four times a day (QID) | ORAL | Status: DC | PRN
  Administered 2021-08-26 – 2021-09-01 (×3): 5 mg via ORAL
  Filled 2021-08-26 (×5): qty 1

## 2021-08-26 MED ORDER — CILOSTAZOL 50 MG PO TABS
50.0000 mg | ORAL_TABLET | Freq: Two times a day (BID) | ORAL | Status: DC
  Administered 2021-08-28 – 2021-09-02 (×10): 50 mg via ORAL
  Filled 2021-08-26 (×17): qty 1

## 2021-08-26 MED ORDER — LACTATED RINGERS IV SOLN: INTRAVENOUS | Status: DC

## 2021-08-26 MED ORDER — MORPHINE SULFATE (PF) 2 MG/ML IV SOLN
1.0000 mg | INTRAVENOUS | Status: DC | PRN
  Administered 2021-08-26: 1 mg via INTRAVENOUS
  Filled 2021-08-26: qty 1

## 2021-08-26 MED ORDER — LACTATED RINGERS IV SOLN: INTRAVENOUS | Status: AC

## 2021-08-26 MED ORDER — MORPHINE SULFATE (PF) 2 MG/ML IV SOLN
1.0000 mg | INTRAVENOUS | Status: DC | PRN
Start: 2021-08-26 — End: 2021-09-03
  Administered 2021-08-27 – 2021-08-28 (×3): 1 mg via INTRAVENOUS
  Filled 2021-08-26 (×3): qty 1

## 2021-08-26 MED ORDER — METHOCARBAMOL 500 MG PO TABS
500.0000 mg | ORAL_TABLET | Freq: Four times a day (QID) | ORAL | Status: DC | PRN
  Administered 2021-08-26 – 2021-08-28 (×2): 500 mg via ORAL
  Filled 2021-08-26 (×2): qty 1

## 2021-08-26 MED ORDER — LEVOTHYROXINE SODIUM 50 MCG PO TABS
50.0000 ug | ORAL_TABLET | Freq: Every day | ORAL | Status: DC
  Administered 2021-08-28 – 2021-09-03 (×5): 50 ug via ORAL
  Filled 2021-08-26 (×7): qty 1

## 2021-08-26 MED ORDER — ACETAMINOPHEN 325 MG PO TABS
325.0000 mg | ORAL_TABLET | Freq: Four times a day (QID) | ORAL | Status: DC | PRN
  Administered 2021-08-26 – 2021-08-29 (×3): 325 mg via ORAL
  Filled 2021-08-26 (×4): qty 1

## 2021-08-26 MED ORDER — METHOCARBAMOL 1000 MG/10ML IJ SOLN
500.0000 mg | Freq: Four times a day (QID) | INTRAVENOUS | Status: DC | PRN
  Administered 2021-08-27: 500 mg via INTRAVENOUS
  Filled 2021-08-26: qty 5
  Filled 2021-08-26: qty 500

## 2021-08-26 MED ORDER — ALPRAZOLAM 0.25 MG PO TABS
0.1250 mg | ORAL_TABLET | Freq: Two times a day (BID) | ORAL | Status: DC | PRN
  Administered 2021-08-28 – 2021-08-31 (×2): 0.125 mg via ORAL
  Filled 2021-08-26 (×2): qty 1

## 2021-08-26 MED ORDER — METOPROLOL SUCCINATE ER 25 MG PO TB24
37.5000 mg | ORAL_TABLET | Freq: Every day | ORAL | Status: DC
Start: 2021-08-26 — End: 2021-09-03
  Administered 2021-08-27 – 2021-09-03 (×7): 37.5 mg via ORAL
  Filled 2021-08-26 (×7): qty 2

## 2021-08-26 NOTE — Assessment & Plan Note (Addendum)
Slightly HTN, but in pain and has not had medication today History of severe orthostatic hypotension and per cardiology allow systolic bp 003-491 to prevent her from dropping  Continue her metoprolol (hx of PAT) , hold hctz at this time.

## 2021-08-26 NOTE — Assessment & Plan Note (Signed)
Continue low dose xanax bid

## 2021-08-26 NOTE — Assessment & Plan Note (Signed)
Continue synthroid TSH wnl 01/2021

## 2021-08-26 NOTE — Assessment & Plan Note (Addendum)
Continue statin and pletal  Hold ASA with surgery tomorrow

## 2021-08-26 NOTE — ED Provider Notes (Addendum)
[facility] Emergency Department Provider Note MRN:  [id number]  Arrival date & time: 08/26/21     Chief Complaint   Fall   History of Present Illness   Katelyn Morris is a 88 y.o. year-old female with a history of CKD 3, HLD, Hypothyroidism, HTN, PAD presenting to the ED with chief complaint of fall.  History obtained from both the patient and the patient's daughter-in-law.  They report that the patient was making lunch when she suffered a mechanical fall and lost her balance.  The patient is supposed to be ambulating with the use of a walker but was not using a walker at the time of her fall.  The patient states that she landed on her left hip.  The patient was lying on the ground for less than 1 hour before help arrived.  She states that she did not hit her head and did not lose consciousness but does report pain to her left lower extremity.  There is obvious shortening and internal rotation of her left lower extremity.  Patient states that her pain is minimal at this time.  Denies pain to her chest abdomen or pelvis or other extremities at this time.  Has no strength or weakness or numbness in her left lower extremity.  Review of Systems  A thorough review of systems was obtained and all systems are negative except as noted in the HPI and PMH.   Patient's Health History    Past Medical History:  Diagnosis Date   Arthritis    in knees per patient   Dysrhythmia    Bradycardia   Heart murmur    Hypertension    Hypothyroidism    Pacemaker 02/20/2012   dual chamber   SSS (sick sinus syndrome) ([place]) 08/06/2014    Past Surgical History:  Procedure Laterality Date   ABDOMINAL HYSTERECTOMY     bladder tack     INSERT / REPLACE / REMOVE PACEMAKER     PERMANENT PACEMAKER INSERTION N/A 02/20/2012   Procedure: PERMANENT PACEMAKER INSERTION;  Surgeon: Sanda Klein, MD;  Location: [facility] CATH LAB;  Service: Cardiovascular;  Laterality: N/A;   ROTATOR CUFF  REPAIR     bilateral    No family history on file.  Social History   Socioeconomic History   Marital status: Widowed    Spouse name: Not on file   Number of children: Not on file   Years of education: Not on file   Highest education level: Not on file  Occupational History   Not on file  Tobacco Use   Smoking status: Never   Smokeless tobacco: Never  Substance and Sexual Activity   Alcohol use: No   Drug use: No   Sexual activity: Never    Birth control/protection: Post-menopausal  Other Topics Concern   Not on file  Social History Narrative   Not on file   Social Determinants of Health   Financial Resource Strain: Not on file  Food Insecurity: Not on file  Transportation Needs: Not on file  Physical Activity: Not on file  Stress: Not on file  Social Connections: Not on file  Intimate Partner Violence: Not on file     Physical Exam   Physical Exam Constitutional:      Appearance: She is well-developed. She is not ill-appearing.  HENT:     Head: Normocephalic and atraumatic.     Right Ear: External ear normal.     Left Ear: External ear normal.  Nose: Nose normal.  Cardiovascular:     Rate and Rhythm: Normal rate and regular rhythm.     Pulses: Normal pulses.     Heart sounds: Normal heart sounds.  Pulmonary:     Effort: Pulmonary effort is normal.     Breath sounds: Normal breath sounds.  Chest:     Chest wall: No tenderness.  Abdominal:     General: Abdomen is flat.     Palpations: Abdomen is soft.     Tenderness: There is no abdominal tenderness.  Musculoskeletal:        General: Tenderness and deformity present.     Cervical back: Neck supple. No tenderness.     Right lower leg: No edema.     Left lower leg: No edema.     Comments: Left hip with left internal rotation  Skin:    General: Skin is warm and dry.  Neurological:     General: No focal deficit present.     Mental Status: She is alert and oriented to person, place, and time.      Cranial Nerves: No cranial nerve deficit.     Sensory: No sensory deficit.     Motor: No weakness.  Psychiatric:        Mood and Affect: Mood normal.      Diagnostic and Interventional Summary    Labs Reviewed  CBC WITH DIFFERENTIAL/PLATELET - Abnormal; Notable for the following components:      Result Value   Neutro Abs 8.8 (*)    All other components within normal limits  COMPREHENSIVE METABOLIC PANEL - Abnormal; Notable for the following components:   Glucose, Bld 105 (*)    Total Protein 6.0 (*)    GFR, Estimated 57 (*)    All other components within normal limits  PROTIME-INR  URINALYSIS, ROUTINE W REFLEX MICROSCOPIC  CBC  BASIC METABOLIC PANEL    CT Head Wo Contrast  Final Result    CT Cervical Spine Wo Contrast  Final Result    DG Hip Unilat W or Wo Pelvis 2-3 Views Left  Final Result    DG Chest 1 View  Final Result      Medications  acetaminophen (TYLENOL) tablet 325 mg (has no administration in time range)  ondansetron (ZOFRAN) injection 4 mg (4 mg Intravenous Given 08/26/21 1729)  oxyCODONE (Oxy IR/ROXICODONE) immediate release tablet 5 mg (5 mg Oral Given 08/26/21 1729)  levothyroxine (SYNTHROID) tablet 50 mcg (has no administration in time range)  cilostazol (PLETAL) tablet 50 mg (has no administration in time range)  ALPRAZolam (XANAX) tablet 0.125 mg (has no administration in time range)  metoprolol succinate (TOPROL-XL) 24 hr tablet 37.5 mg (has no administration in time range)  hydrochlorothiazide (HYDRODIURIL) tablet 12.5 mg (has no administration in time range)  atorvastatin (LIPITOR) tablet 10 mg (has no administration in time range)  methocarbamol (ROBAXIN) tablet 500 mg (has no administration in time range)    Or  methocarbamol (ROBAXIN) 500 mg in dextrose 5 % 50 mL IVPB (has no administration in time range)  morphine (PF) 2 MG/ML injection 1 mg (has no administration in time range)  lactated ringers infusion (has no administration in time range)      Procedures  /  Critical Care Procedures  ED Course and Medical Decision Making  Initial Impression and Ddx 88 year old female presenting after mechanical fall.  Obvious deformity to left lower extremity.  Differential diagnosis includes resolving to the following: Acute fracture dislocation of the left lower  extremity including her hip.  Acute skull fracture, intracranial injury, cervical spine fracture.  Interpretation of Diagnostics I personally reviewed the Chest Xray and Cardiac Monitor and my interpretation is as follows: Chest x-ray without acute cardiopulmonary process.  Cardiac monitor reveals patient in normal sinus rhythm.    CT cervical spine was concerning for possible cervical injury however the patient was without tenderness at this site.  Favored to be artifact.  CT head negative for acute intracranial abnormality.  X-ray reviewed of the lower extremity and was positive for acute femoral neck fracture.  Discussed the patient's care with the on-call orthopedic surgeon who would like the patient to be n.p.o. at midnight for possible surgical intervention tomorrow morning.  This was also discussed with the patient and patient's family members.  IV narcotics, antiemetics, and fluids were given to the patient  Patient Reassessment and Ultimate Disposition/Management Will admit the patient to hospitalist for further management.  Please see their note for further details.  Patient management required discussion with the following services or consulting groups:  Hospitalist Service and Orthopedic Surgery  Complexity of Problems Addressed Acute illness or injury that poses threat of life of bodily function  Additional Data Reviewed and Analyzed Further history obtained from: Recent PCP notes  Factors Impacting ED Encounter Risk Consideration of hospitalization    Final Clinical Impressions(s) / ED Diagnoses     ICD-10-CM   1. Closed fracture of left femur, unspecified  fracture morphology, unspecified portion of femur, initial encounter ([place])  S72.92XA     2. Fall  [id number].Daymon Larsen Chest 1 View    DG Chest 1 View        Zachery Dakins, MD 08/26/21 1857    Zachery Dakins, MD 08/26/21 Mauro Kaufmann    Drenda Freeze, MD 09/02/21 [phone]

## 2021-08-26 NOTE — Assessment & Plan Note (Addendum)
88 year old with mechanical fall and subsequent displaced left hip fracture with varus angulation  -admit to telemetry -ortho consulted with plans for OR tomorrow, Dr. Sammuel Hines -NPO at midnight -hip fx protocol -pain medication with tylenol, oxycodone and morphine prn for severe pain (low dose)  -robaxin prn for muscle spasms.  -hold ASA -check CK -SW consult

## 2021-08-26 NOTE — ED Notes (Signed)
Patient transported to CT 

## 2021-08-26 NOTE — Assessment & Plan Note (Signed)
baseline creatinine 1.1, stable

## 2021-08-26 NOTE — Assessment & Plan Note (Addendum)
PPM in 2013, regular interrogation checks, followed by cardiology  Denies any palpitations/chest pain prior to fall

## 2021-08-26 NOTE — H&P (Signed)
History and Physical    Patient: Katelyn Morris:[id number] DOB: 06-Dec-1922 DOA: 08/26/2021 DOS: the patient was seen and examined on 08/26/2021 PCP: Lawerance Cruel, MD  Patient coming from: Home - lives alone, son lives next door. Uses walker to ambulate    Chief Complaint: fall with left hip pain   HPI: Katelyn Morris is a 88 y.o. female with medical history significant of HTN, hypothyroidism, SSS s/p PPM placement, CKD stage 3, HLD, PAD, anxiety presenting to ED after mechanical fall today. She was in her kitchen cutting up a tomato around lunchtime and likely turned to get something, lost her balance and fell onto her left side. She denies hitting her head or losing consciousness. She denies any neck pain. She had immediate pain to her left leg and couldn't bear weight. She also didn't have her safety button on; however, her son had just dropped off lunch and then a neighbor dropped by shortly after, so they do not think she was down on the ground for long at all. Ems was called when they found her.   She is resting and very pleasant. Family is by bedside and helps provide history. She is very talkative. She denies any recent illness/fever or chills, no headaches or dizziness, no chest pain or palpitations, no shortness of breath or cough, no stomach pain, no N/V/D, no leg swelling or dysuria.    ER Course:   Vitals: afebrile, bp: 161/57, HR: 57, RR: 16, oxygen: 94% RA Pertinent labs: none Left hip: displaced left femoral neck fracture with varus angulation CTH: no acute finding CT cervical: subtle calcification along the anterior inferior aspect of the lateral mass of C1 w/out associated prevertebral soft tissue swelling in the area. Cannot exclude subtle fracture.  In ED: ortho consulted. TRH asked to admit.    Review of Systems: As mentioned in the history of present illness. All other systems reviewed and are negative. Past Medical History:  Diagnosis Date   Arthritis     in knees per patient   Dysrhythmia    Bradycardia   Heart murmur    Hypertension    Hypothyroidism    Pacemaker 02/20/2012   dual chamber   SSS (sick sinus syndrome) ([place]) 08/06/2014   Past Surgical History:  Procedure Laterality Date   ABDOMINAL HYSTERECTOMY     bladder tack     INSERT / REPLACE / REMOVE PACEMAKER     PERMANENT PACEMAKER INSERTION N/A 02/20/2012   Procedure: PERMANENT PACEMAKER INSERTION;  Surgeon: Sanda Klein, MD;  Location: [facility];  Service: Cardiovascular;  Laterality: N/A;   ROTATOR CUFF REPAIR     bilateral   Social History:  reports that she has never smoked. She has never used smokeless tobacco. She reports that she does not drink alcohol and does not use drugs.  Allergies  Allergen Reactions   Sulfa Antibiotics Swelling   Sulfonamide Derivatives Swelling    No family history on file.  Prior to Admission medications   Medication Sig Start Date End Date Taking? Authorizing Provider  ALPRAZolam Duanne Moron) 0.5 MG tablet Take 0.25 mg by mouth at bedtime as needed.    [provider]  aspirin 81 MG tablet Take 81 mg by mouth daily.    [provider]  atorvastatin (LIPITOR) 10 MG tablet TAKE 1 TABLET EVERY DAY 04/19/21   Croitoru, Mihai, MD  Cholecalciferol (VITAMIN D3 PO) Take 2,000 Units by mouth daily.     [provider]  cilostazol (PLETAL)  50 MG tablet TAKE 1 TABLET TWICE DAILY 04/19/21   Croitoru, Mihai, MD  hydrochlorothiazide (HYDRODIURIL) 12.5 MG tablet Take 1 tablet (12.5 mg total) by mouth daily. 10/25/19   Croitoru, Mihai, MD  levothyroxine (SYNTHROID, LEVOTHROID) 50 MCG tablet Take 50 mcg by mouth daily.    [provider]  metoprolol succinate (TOPROL-XL) 25 MG 24 hr tablet TAKE 1 AND 1/2 TABLETS (37.5 MG TOTAL) DAILY. TAKE WITH OR IMMEDIATELY FOLLOWING A MEAL. DOSE CHANGE 07/22/21   Croitoru, Dani Gobble, MD    Physical Exam: Vitals:   08/26/21 1800 08/26/21 1830 08/26/21 1845 08/26/21 1907  BP: 129/74 (!)  162/40 127/60   Pulse: 62 65 (!) 57 65  Resp: 15 15 14 15   Temp:      TempSrc:      SpO2: 95% 92% 93% 92%  Weight:      Height:       General:  Appears calm and comfortable and is in NAD. Thin  Eyes:  PERRL, EOMI, normal lids, iris ENT:  grossly normal hearing, lips & tongue, mmm; appropriate dentition Neck:  no LAD, masses or thyromegaly; no carotid bruits Cardiovascular:  RRR, no m/r/g. No LE edema.  Respiratory:   CTA bilaterally with no wheezes/rales/rhonchi.  Normal respiratory effort. Abdomen:  soft, NT, ND, NABS Back:   normal alignment, no CVAT Skin:  no rash or induration seen on limited exam. No bruises or skin tears Musculoskeletal:  LLE shortened and abducted. Sensation intact. Pedal pulses palpable. Can move toes. TTP over left hip.  Lower extremity:  No LE edema.  Limited foot exam with no ulcerations.  2+ distal pulses. Psychiatric:  grossly normal mood and affect, speech fluent and appropriate, AOx3 Neurologic:  CN 2-12 grossly intact, moves all extremities in coordinated fashion, sensation intact   Radiological Exams on Admission: Independently reviewed - see discussion in A/P where applicable  DG Chest 1 View  Result Date: 08/26/2021 CLINICAL DATA:  Fall, pain EXAM: CHEST  1 VIEW COMPARISON:  Radiograph 08/18/2015 FINDINGS: Cardiomediastinal silhouette is within normal limits. Unchanged dual chamber pacemaker leads. No focal airspace consolidation. No pleural effusion. No pneumothorax. Bilateral shoulder degenerative changes. No acute osseous abnormality. Thoracic spondylosis. IMPRESSION: No evidence of acute cardiopulmonary disease. Electronically Signed   By: Maurine Simmering M.D.   On: 08/26/2021 16:29   CT Head Wo Contrast  Result Date: 08/26/2021 CLINICAL DATA:  A 88 year old female presents for evaluation of pain following fall. EXAM: CT HEAD WITHOUT CONTRAST CT CERVICAL SPINE WITHOUT CONTRAST TECHNIQUE: Multidetector CT imaging of the head and cervical spine was  performed following the standard protocol without intravenous contrast. Multiplanar CT image reconstructions of the cervical spine were also generated. RADIATION DOSE REDUCTION: This exam was performed according to the departmental dose-optimization program which includes automated exposure control, adjustment of the mA and/or kV according to patient size and/or use of iterative reconstruction technique. COMPARISON:  None FINDINGS: CT HEAD FINDINGS Brain: No evidence of acute infarction, hemorrhage, hydrocephalus, extra-axial collection or mass lesion/mass effect. Signs of generalized cerebral atrophy and chronic microvascular ischemic change. Vascular: No hyperdense vessel or unexpected calcification. Skull: Normal. Negative for fracture or focal lesion. Sinuses/Orbits: Visualized paranasal sinuses and orbits without acute finding Other: None. CT CERVICAL SPINE FINDINGS Alignment: Accentuated cervical lordosis. Extensive spinal degenerative changes. Head turned to the RIGHT. No frank listhesis. Degenerative changes include disc space narrowing and facet arthropathy. Skull base and vertebrae: A subtle calcification along the inferior margin of the lateral mass anteriorly of C1. Unfortunately  there is streak artifact that passes through this area as well which limits assessment. There is no associated prevertebral soft tissue swelling in the area. No primary bone lesion or focal pathologic process. Loss of height at T1 is age indeterminate with mild loss of height of the T1 superior endplate approximately 27% loss of height at this level. No associated prevertebral soft tissue swelling. Soft tissues and spinal canal: No prevertebral fluid or swelling. No visible canal hematoma. Disc levels: Signs of multilevel degenerative change of the cervical spine with facet arthropathy and disc space narrowing with uncovertebral spurring greatest at C5-6 and C6-7. Upper chest: Biapical pleural and parenchymal scarring. Other:  None IMPRESSION: 1. No acute intracranial abnormality. 2. Signs of generalized cerebral atrophy and chronic microvascular ischemic change. 3. A subtle calcification along the anterior inferior aspect of the lateral mass of C1 (image 23/7) without associated prevertebral soft tissue swelling in the area. Difficult to exclude a subtle fracture in this location degenerative changes possible and unfortunately there is streak artifact in the area from dental hardware. 4. T1 superior endplate the fraction approximately 10% loss of height favors chronic injury in the absence of prevertebral soft tissue swelling. Correlate with any pain in this area. 5. Extensive spinal degenerative changes of the cervical spine with facet arthropathy and disc space narrowing greatest at C5-6 and C6-7. Electronically Signed   By: Zetta Bills M.D.   On: 08/26/2021 18:02   CT Cervical Spine Wo Contrast  Result Date: 08/26/2021 CLINICAL DATA:  A 88 year old female presents for evaluation of pain following fall. EXAM: CT HEAD WITHOUT CONTRAST CT CERVICAL SPINE WITHOUT CONTRAST TECHNIQUE: Multidetector CT imaging of the head and cervical spine was performed following the standard protocol without intravenous contrast. Multiplanar CT image reconstructions of the cervical spine were also generated. RADIATION DOSE REDUCTION: This exam was performed according to the departmental dose-optimization program which includes automated exposure control, adjustment of the mA and/or kV according to patient size and/or use of iterative reconstruction technique. COMPARISON:  None FINDINGS: CT HEAD FINDINGS Brain: No evidence of acute infarction, hemorrhage, hydrocephalus, extra-axial collection or mass lesion/mass effect. Signs of generalized cerebral atrophy and chronic microvascular ischemic change. Vascular: No hyperdense vessel or unexpected calcification. Skull: Normal. Negative for fracture or focal lesion. Sinuses/Orbits: Visualized paranasal  sinuses and orbits without acute finding Other: None. CT CERVICAL SPINE FINDINGS Alignment: Accentuated cervical lordosis. Extensive spinal degenerative changes. Head turned to the RIGHT. No frank listhesis. Degenerative changes include disc space narrowing and facet arthropathy. Skull base and vertebrae: A subtle calcification along the inferior margin of the lateral mass anteriorly of C1. Unfortunately there is streak artifact that passes through this area as well which limits assessment. There is no associated prevertebral soft tissue swelling in the area. No primary bone lesion or focal pathologic process. Loss of height at T1 is age indeterminate with mild loss of height of the T1 superior endplate approximately 03% loss of height at this level. No associated prevertebral soft tissue swelling. Soft tissues and spinal canal: No prevertebral fluid or swelling. No visible canal hematoma. Disc levels: Signs of multilevel degenerative change of the cervical spine with facet arthropathy and disc space narrowing with uncovertebral spurring greatest at C5-6 and C6-7. Upper chest: Biapical pleural and parenchymal scarring. Other: None IMPRESSION: 1. No acute intracranial abnormality. 2. Signs of generalized cerebral atrophy and chronic microvascular ischemic change. 3. A subtle calcification along the anterior inferior aspect of the lateral mass of C1 (image 23/7) without associated  prevertebral soft tissue swelling in the area. Difficult to exclude a subtle fracture in this location degenerative changes possible and unfortunately there is streak artifact in the area from dental hardware. 4. T1 superior endplate the fraction approximately 10% loss of height favors chronic injury in the absence of prevertebral soft tissue swelling. Correlate with any pain in this area. 5. Extensive spinal degenerative changes of the cervical spine with facet arthropathy and disc space narrowing greatest at C5-6 and C6-7. Electronically  Signed   By: Zetta Bills M.D.   On: 08/26/2021 18:02   DG Hip Unilat W or Wo Pelvis 2-3 Views Left  Result Date: 08/26/2021 CLINICAL DATA:  Concern for fracture EXAM: DG HIP (WITH OR WITHOUT PELVIS) 2-3V LEFT COMPARISON:  None. FINDINGS: There is a displaced left femoral neck fracture with varus angulation. Mild bilateral hip osteoarthritis. IMPRESSION: Displaced left femoral neck fracture with varus angulation. Electronically Signed   By: Maurine Simmering M.D.   On: 08/26/2021 16:27    EKG: pending for baseline/surgery. Have asked 2x and for release.    Labs on Admission: I have personally reviewed the available labs and imaging studies at the time of the admission.  Pertinent labs:   None UA pending Ekg pending     Assessment and Plan: * Closed left hip fracture, initial encounter ([place])- (present on admission) 88 year old with mechanical fall and subsequent displaced left hip fracture with varus angulation  -admit to telemetry -ortho consulted with plans for OR tomorrow, Dr. Sammuel Hines -NPO at midnight -hip fx protocol -pain medication with tylenol, oxycodone and morphine prn for severe pain (low dose)  -robaxin prn for muscle spasms.  -hold ASA -check CK -SW consult   Abnormal CT scan, cervical spine- (present on admission) Questionable C1 subtle fracture. Could not rule out due to artifact -patient denies hitting head and denies neck pain -continue to monitor   Hypercholesterolemia- (present on admission) Cholesterol well controlled, continue lipitor   Essential hypertension- (present on admission) Slightly HTN, but in pain and has not had medication today History of severe orthostatic hypotension and per cardiology allow systolic bp 161-096 to prevent her from dropping  Continue her metoprolol (hx of PAT) , hold hctz at this time.    CKD (chronic kidney disease) stage 3, GFR 30-59 ml/min (HCC) baseline creatinine 1.1, stable   PAD (peripheral artery disease) ([place])-  (present on admission) Continue statin and pletal  Hold ASA with surgery tomorrow  SSS (sick sinus syndrome) s/p PPM- (present on admission) PPM in 2013, regular interrogation checks, followed by cardiology  Denies any palpitations/chest pain prior to fall    Hypothyroidism- (present on admission) Continue synthroid TSH wnl 01/2021  Anxiety- (present on admission) Continue low dose xanax bid     Advance Care Planning:   Code Status: DNR   Consults: ortho: Dr. Sammuel Hines   DVT Prophylaxis: SCDs/surgery tomorrow   Family Communication: son and daughter in [employer] by bedside: steven and cheryl Criado   Severity of Illness: The appropriate patient status for this patient is INPATIENT. Inpatient status is judged to be reasonable and necessary in order to provide the required intensity of service to ensure the patient's safety. The patient's presenting symptoms, physical exam findings, and initial radiographic and laboratory data in the context of their chronic comorbidities is felt to place them at high risk for further clinical deterioration. Furthermore, it is not anticipated that the patient will be medically stable for discharge from the hospital within 2 midnights of admission.   *  I certify that at the point of admission it is my clinical judgment that the patient will require inpatient hospital care spanning beyond 2 midnights from the point of admission due to high intensity of service, high risk for further deterioration and high frequency of surveillance required.*  Author: Orma Flaming, MD 08/26/2021 7:15 PM  For on call review [email or web address].

## 2021-08-26 NOTE — Assessment & Plan Note (Signed)
Cholesterol well controlled, continue lipitor

## 2021-08-26 NOTE — ED Triage Notes (Signed)
Pt here via EMS d/t fall in kitchen. No blood thinners. Pain left hip, with rotation and shortening. No LOC, denies other injuries. A/OX4. Arrived in C-Collar precaution  146mcg Fent 18 RT AC 180/68 HR 73 RR 16 96% RA

## 2021-08-26 NOTE — Assessment & Plan Note (Signed)
Questionable C1 subtle fracture. Could not rule out due to artifact -patient denies hitting head and denies neck pain -continue to monitor

## 2021-08-27 ENCOUNTER — Inpatient Hospital Stay (HOSPITAL_COMMUNITY): Payer: Medicare HMO | Admitting: Anesthesiology

## 2021-08-27 ENCOUNTER — Encounter (HOSPITAL_COMMUNITY)
Admission: EM | Disposition: A | Discharge status: Skilled Nursing Facility | Payer: Self-pay | Source: Home / Self Care | Attending: Family Medicine

## 2021-08-27 ENCOUNTER — Encounter (HOSPITAL_COMMUNITY): Payer: Self-pay | Admitting: Family Medicine

## 2021-08-27 ENCOUNTER — Inpatient Hospital Stay (HOSPITAL_COMMUNITY): Payer: Medicare HMO

## 2021-08-27 ENCOUNTER — Other Ambulatory Visit: Payer: Self-pay

## 2021-08-27 DIAGNOSIS — S72002A Fracture of unspecified part of neck of left femur, initial encounter for closed fracture: Principal | ICD-10-CM

## 2021-08-27 HISTORY — PX: TOTAL HIP ARTHROPLASTY: SHX124

## 2021-08-27 LAB — URINALYSIS, ROUTINE W REFLEX MICROSCOPIC
Bilirubin Urine: NEGATIVE
Glucose, UA: NEGATIVE mg/dL
Hgb urine dipstick: NEGATIVE
Ketones, ur: NEGATIVE mg/dL
Nitrite: POSITIVE — AB
Protein, ur: NEGATIVE mg/dL
Specific Gravity, Urine: 1.02 (ref 1.005–1.030)
pH: 6 (ref 5.0–8.0)

## 2021-08-27 LAB — BASIC METABOLIC PANEL
Anion gap: 7 (ref 5–15)
BUN: 18 mg/dL (ref 8–23)
CO2: 28 mmol/L (ref 22–32)
Calcium: 8.7 mg/dL — ABNORMAL LOW (ref 8.9–10.3)
Chloride: 103 mmol/L (ref 98–111)
Creatinine, Ser: 0.86 mg/dL (ref 0.44–1.00)
GFR, Estimated: >60 mL/min (ref >60)
Glucose, Bld: 107 mg/dL — ABNORMAL HIGH (ref 70–99)
Potassium: 3.7 mmol/L (ref 3.5–5.1)
Sodium: 138 mmol/L (ref 135–145)

## 2021-08-27 LAB — CBC
HCT: 36.5 % (ref 36.0–46.0)
Hemoglobin: 12.2 g/dL (ref 12.0–15.0)
MCH: 30.1 pg (ref 26.0–34.0)
MCHC: 33.4 g/dL (ref 30.0–36.0)
MCV: 90.1 fL (ref 80.0–100.0)
Platelets: 163 10*3/uL (ref 150–400)
RBC: 4.05 MIL/uL (ref 3.87–5.11)
RDW: 12.7 % (ref 11.5–15.5)
WBC: 10.5 10*3/uL (ref 4.0–10.5)
nRBC: 0 % (ref 0.0–0.2)

## 2021-08-27 LAB — SURGICAL PCR SCREEN
MRSA, PCR: NEGATIVE
Staphylococcus aureus: NEGATIVE

## 2021-08-27 LAB — RESP PANEL BY RT-PCR (FLU A&B, COVID) ARPGX2
Influenza A by PCR: NEGATIVE
Influenza B by PCR: NEGATIVE
SARS Coronavirus 2 by RT PCR: NEGATIVE

## 2021-08-27 LAB — URINALYSIS, MICROSCOPIC (REFLEX)

## 2021-08-27 LAB — CK: Total CK: 663 U/L — ABNORMAL HIGH (ref 38–234)

## 2021-08-27 SURGERY — ARTHROPLASTY, HIP, TOTAL, ANTERIOR APPROACH
Anesthesia: Spinal | Site: Hip | Laterality: Left

## 2021-08-27 MED ORDER — SODIUM CHLORIDE 0.9 % IV SOLN
1.0000 g | INTRAVENOUS | Status: AC
  Administered 2021-08-27 – 2021-08-29 (×3): 1 g via INTRAVENOUS
  Filled 2021-08-27 (×3): qty 10

## 2021-08-27 MED ORDER — PROPOFOL 10 MG/ML IV BOLUS
INTRAVENOUS | Status: AC
  Filled 2021-08-27: qty 20

## 2021-08-27 MED ORDER — ORAL CARE MOUTH RINSE: 15.0000 mL | Freq: Once | OROMUCOSAL | Status: AC

## 2021-08-27 MED ORDER — LACTATED RINGERS IV SOLN: INTRAVENOUS | Status: DC

## 2021-08-27 MED ORDER — PHENYLEPHRINE HCL-NACL 20-0.9 MG/250ML-% IV SOLN
INTRAVENOUS | Status: DC | PRN
  Administered 2021-08-27: 20 ug/min via INTRAVENOUS

## 2021-08-27 MED ORDER — DEXAMETHASONE SODIUM PHOSPHATE 10 MG/ML IJ SOLN
INTRAMUSCULAR | Status: AC
  Filled 2021-08-27: qty 1

## 2021-08-27 MED ORDER — ONDANSETRON HCL 4 MG/2ML IJ SOLN
INTRAMUSCULAR | Status: DC | PRN
  Administered 2021-08-27: 4 mg via INTRAVENOUS

## 2021-08-27 MED ORDER — ONDANSETRON HCL 4 MG/2ML IJ SOLN: 4.0000 mg | Freq: Four times a day (QID) | INTRAMUSCULAR | Status: DC | PRN

## 2021-08-27 MED ORDER — FENTANYL CITRATE (PF) 250 MCG/5ML IJ SOLN
INTRAMUSCULAR | Status: AC
  Filled 2021-08-27: qty 5

## 2021-08-27 MED ORDER — POVIDONE-IODINE 10 % EX SWAB
2.0000 "application " | Freq: Once | CUTANEOUS | Status: AC
  Administered 2021-08-27: 2 via TOPICAL

## 2021-08-27 MED ORDER — CEFAZOLIN SODIUM-DEXTROSE 2-4 GM/100ML-% IV SOLN: 2.0000 g | Freq: Three times a day (TID) | INTRAVENOUS | Status: DC

## 2021-08-27 MED ORDER — VANCOMYCIN HCL 1000 MG IV SOLR
INTRAVENOUS | Status: AC
  Filled 2021-08-27: qty 20

## 2021-08-27 MED ORDER — BUPIVACAINE IN DEXTROSE 0.75-8.25 % IT SOLN
INTRATHECAL | Status: DC | PRN
  Administered 2021-08-27: 1.8 mL via INTRATHECAL

## 2021-08-27 MED ORDER — CHLORHEXIDINE GLUCONATE 4 % EX LIQD
60.0000 mL | Freq: Once | CUTANEOUS | Status: AC
  Administered 2021-08-27: 4 via TOPICAL
  Filled 2021-08-27: qty 60

## 2021-08-27 MED ORDER — FENTANYL CITRATE (PF) 250 MCG/5ML IJ SOLN
INTRAMUSCULAR | Status: DC | PRN
  Administered 2021-08-27 (×2): 25 ug via INTRAVENOUS

## 2021-08-27 MED ORDER — OXYCODONE HCL 5 MG PO TABS: 5.0000 mg | ORAL_TABLET | Freq: Once | ORAL | Status: DC | PRN

## 2021-08-27 MED ORDER — VANCOMYCIN HCL 1000 MG IV SOLR
INTRAVENOUS | Status: DC | PRN
  Administered 2021-08-27: 1000 mg via TOPICAL

## 2021-08-27 MED ORDER — TRANEXAMIC ACID-NACL 1000-0.7 MG/100ML-% IV SOLN
1000.0000 mg | INTRAVENOUS | Status: AC
  Administered 2021-08-27: 1000 mg via INTRAVENOUS
  Filled 2021-08-27: qty 100

## 2021-08-27 MED ORDER — OXYCODONE HCL 5 MG/5ML PO SOLN: 5.0000 mg | Freq: Once | ORAL | Status: DC | PRN

## 2021-08-27 MED ORDER — CEFAZOLIN SODIUM-DEXTROSE 2-4 GM/100ML-% IV SOLN
2.0000 g | INTRAVENOUS | Status: AC
  Administered 2021-08-27: 2 g via INTRAVENOUS
  Filled 2021-08-27: qty 100

## 2021-08-27 MED ORDER — PHENYLEPHRINE 40 MCG/ML (10ML) SYRINGE FOR IV PUSH (FOR BLOOD PRESSURE SUPPORT)
PREFILLED_SYRINGE | INTRAVENOUS | Status: DC | PRN
  Administered 2021-08-27: 120 ug via INTRAVENOUS

## 2021-08-27 MED ORDER — 0.9 % SODIUM CHLORIDE (POUR BTL) OPTIME
TOPICAL | Status: DC | PRN
  Administered 2021-08-27: 1000 mL

## 2021-08-27 MED ORDER — ROCURONIUM BROMIDE 10 MG/ML (PF) SYRINGE
PREFILLED_SYRINGE | INTRAVENOUS | Status: AC
  Filled 2021-08-27: qty 10

## 2021-08-27 MED ORDER — FENTANYL CITRATE (PF) 100 MCG/2ML IJ SOLN: 25.0000 ug | INTRAMUSCULAR | Status: DC | PRN

## 2021-08-27 MED ORDER — PROPOFOL 10 MG/ML IV BOLUS
INTRAVENOUS | Status: DC | PRN
  Administered 2021-08-27: 20 mg via INTRAVENOUS
  Administered 2021-08-27: 25 ug/kg/min via INTRAVENOUS

## 2021-08-27 MED ORDER — LIDOCAINE 2% (20 MG/ML) 5 ML SYRINGE
INTRAMUSCULAR | Status: AC
  Filled 2021-08-27: qty 5

## 2021-08-27 MED ORDER — CHLORHEXIDINE GLUCONATE 0.12 % MT SOLN
15.0000 mL | Freq: Once | OROMUCOSAL | Status: AC
  Administered 2021-08-27: 15 mL via OROMUCOSAL
  Filled 2021-08-27: qty 15

## 2021-08-27 MED ORDER — ONDANSETRON HCL 4 MG/2ML IJ SOLN
INTRAMUSCULAR | Status: AC
  Filled 2021-08-27: qty 2

## 2021-08-27 MED ORDER — SODIUM CHLORIDE 0.9 % IR SOLN
Status: DC | PRN
  Administered 2021-08-27: 1000 mL

## 2021-08-27 SURGICAL SUPPLY — 44 items
APL PRP STRL LF DISP 70% ISPRP (MISCELLANEOUS) ×1
BAG COUNTER SPONGE SURGICOUNT (BAG) ×2 IMPLANT
BAG SPNG CNTER NS LX DISP (BAG) ×1
BLADE SAW SGTL 18X1.27X75 (BLADE) ×2 IMPLANT
CHLORAPREP W/TINT 26 (MISCELLANEOUS) ×1 IMPLANT
COVER SURGICAL LIGHT HANDLE (MISCELLANEOUS) ×2 IMPLANT
DRAPE C-ARM 42X72 X-RAY (DRAPES) ×2 IMPLANT
DRAPE STERI IOBAN 125X83 (DRAPES) ×2 IMPLANT
DRAPE U-SHAPE 47X51 STRL (DRAPES) ×6 IMPLANT
DRSG AQUACEL AG ADV 3.5X10 (GAUZE/BANDAGES/DRESSINGS) ×2 IMPLANT
ELECT BLADE 4.0 EZ CLEAN MEGAD (MISCELLANEOUS) ×2
ELECT REM PT RETURN 9FT ADLT (ELECTROSURGICAL) ×2
ELECTRODE BLDE 4.0 EZ CLN MEGD (MISCELLANEOUS) ×1 IMPLANT
ELECTRODE REM PT RTRN 9FT ADLT (ELECTROSURGICAL) ×1 IMPLANT
GLOVE SRG 8 PF TXTR STRL LF DI (GLOVE) ×2 IMPLANT
GLOVE SURG LTX SZ8 (GLOVE) ×2 IMPLANT
GLOVE SURG ORTHO LTX SZ7.5 (GLOVE) ×4 IMPLANT
GLOVE SURG UNDER POLY LF SZ8 (GLOVE) ×4
GOWN STRL REUS W/ TWL LRG LVL3 (GOWN DISPOSABLE) ×2 IMPLANT
GOWN STRL REUS W/ TWL XL LVL3 (GOWN DISPOSABLE) ×2 IMPLANT
GOWN STRL REUS W/TWL LRG LVL3 (GOWN DISPOSABLE) ×4
GOWN STRL REUS W/TWL XL LVL3 (GOWN DISPOSABLE) ×4
HANDPIECE INTERPULSE COAX TIP (DISPOSABLE) ×2
HEAD FEM UNIPOLAR 45 OD STRL (Hips) ×1 IMPLANT
KIT BASIN OR (CUSTOM PROCEDURE TRAY) ×2 IMPLANT
KIT TURNOVER KIT B (KITS) ×2 IMPLANT
MANIFOLD NEPTUNE II (INSTRUMENTS) ×2 IMPLANT
NS IRRIG 1000ML POUR BTL (IV SOLUTION) ×2 IMPLANT
PACK TOTAL JOINT (CUSTOM PROCEDURE TRAY) ×2 IMPLANT
PAD ARMBOARD 7.5X6 YLW CONV (MISCELLANEOUS) ×2 IMPLANT
SET HNDPC FAN SPRY TIP SCT (DISPOSABLE) ×1 IMPLANT
SPACER DEPUY (Hips) ×1 IMPLANT
STAPLER VISISTAT 35W (STAPLE) ×1 IMPLANT
STEM FEM SZ3 STD ACTIS (Stem) ×1 IMPLANT
SUT ETHIBOND NAB CT1 #1 30IN (SUTURE) ×2 IMPLANT
SUT VIC AB 0 CT1 27 (SUTURE) ×2
SUT VIC AB 0 CT1 27XBRD ANBCTR (SUTURE) ×1 IMPLANT
SUT VIC AB 1 CT1 27 (SUTURE) ×2
SUT VIC AB 1 CT1 27XBRD ANBCTR (SUTURE) ×1 IMPLANT
SUT VIC AB 2-0 CT1 27 (SUTURE) ×6
SUT VIC AB 2-0 CT1 TAPERPNT 27 (SUTURE) ×1 IMPLANT
TOWEL GREEN STERILE (TOWEL DISPOSABLE) ×2 IMPLANT
TOWEL GREEN STERILE FF (TOWEL DISPOSABLE) ×2 IMPLANT
WATER STERILE IRR 1000ML POUR (IV SOLUTION) ×3 IMPLANT

## 2021-08-27 NOTE — TOC CAGE-AID Note (Signed)
Transition of Care [facility]) - CAGE-AID Screening   Patient Details  Name: Katelyn Morris MRN: [id number] Date of Birth: 04/08/1923  Transition of Care [facility]) CM/SW Contact:    Mariavictoria Nottingham C Tarpley-Carter, [place] Phone Number: 08/27/2021, 8:31 AM   Clinical Narrative: Pt is unable to participate in Cage Aid.  Pt is not appropriate for assessment.  Shekela Goodridge Tarpley-Carter, MSW, LCSW-A Pronouns:  She/Her/Hers [place] Transitions of Care Clinical Social Worker Direct Number:  [phone] [email or web address]  CAGE-AID Screening: Substance Abuse Screening unable to be completed due to: : Patient unable to participate (Pt is not appropriate for assessment.)             Substance Abuse Education Offered: No

## 2021-08-27 NOTE — Interval H&P Note (Signed)
History and Physical Interval Note:  08/27/2021 3:41 PM  Katelyn Morris  has presented today for surgery, with the diagnosis of Left femoral neck fracture.  The various methods of treatment have been discussed with the patient and family. After consideration of risks, benefits and other options for treatment, the patient has consented to  Procedure(s): HEMI HIP ARTHROPLASTY ANTERIOR APPROACH (Left) as a surgical intervention.  The patient's history has been reviewed, patient examined, no change in status, stable for surgery.  I have reviewed the patient's chart and labs.  Questions were answered to the patient's satisfaction.     Vanetta Mulders

## 2021-08-27 NOTE — Consult Note (Signed)
ORTHOPAEDIC CONSULTATION  REQUESTING PHYSICIAN: Orma Flaming, MD  Chief Complaint: Left hip pain  HPI: Katelyn Morris is a 88 y.o. female who presents with left hip pain after a fall while at home.  Of note she does live independently and uses a walker for ambulation.  Her son went to check on her and drop off lunch and found her down.  She subsequently has had left hip pain and inability to bear weight.  Found to have a femoral neck fracture in the emergency room.  She is here today with her son as well as her daughter-in-law who is her medical power of attorney.  She is otherwise fairly mobile and healthy and lives alone.  She has a DNR in place  Past Medical History:  Diagnosis Date   Arthritis    in knees per patient   Dysrhythmia    Bradycardia   Heart murmur    Hypertension    Hypothyroidism    Pacemaker 02/20/2012   dual chamber   SSS (sick sinus syndrome) ([place]) 08/06/2014   Past Surgical History:  Procedure Laterality Date   ABDOMINAL HYSTERECTOMY     bladder tack     INSERT / REPLACE / REMOVE PACEMAKER     PERMANENT PACEMAKER INSERTION N/A 02/20/2012   Procedure: PERMANENT PACEMAKER INSERTION;  Surgeon: Sanda Klein, MD;  Location: [facility]ATH [facility];  Service: Cardiovascular;  Laterality: N/A;   ROTATOR CUFF REPAIR     bilateral   Social History   Socioeconomic History   Marital status: Widowed    Spouse name: Not on file   Number of children: Not on file   Years of education: Not on file   Highest education level: Not on file  Occupational History   Not on file  Tobacco Use   Smoking status: Never   Smokeless tobacco: Never  Substance and Sexual Activity   Alcohol use: No   Drug use: No   Sexual activity: Never    Birth control/protection: Post-menopausal  Other Topics Concern   Not on file  Social History Narrative   Not on file   Social Determinants of Health   Financial Resource Strain: Not on file  Food Insecurity: Not on file  Transportation  Needs: Not on file  Physical Activity: Not on file  Stress: Not on file  Social Connections: Not on file   No family history on file. - negative except otherwise stated in the family history section Allergies  Allergen Reactions   Trazodone Hcl     Other reaction(s): more restless,  drawing in back of neck   Sulfa Antibiotics Swelling   Sulfonamide Derivatives Swelling   Prior to Admission medications   Medication Sig Start Date End Date Taking? Authorizing Provider  ALPRAZolam (XANAX) 0.25 MG tablet Take 0.125 mg by mouth at bedtime.   Yes [provider]  aspirin 81 MG tablet Take 81 mg by mouth daily.   Yes [provider]  atorvastatin (LIPITOR) 10 MG tablet TAKE 1 TABLET EVERY DAY Patient taking differently: Take 10 mg by mouth every evening. 04/19/21  Yes Croitoru, Mihai, MD  Cholecalciferol (VITAMIN D) 50 MCG (2000 UT) CAPS Take 1 capsule by mouth daily.   Yes [provider]  cilostazol (PLETAL) 50 MG tablet TAKE 1 TABLET TWICE DAILY Patient taking differently: Take 50 mg by mouth 2 (two) times daily. 04/19/21  Yes Croitoru, Mihai, MD  hydrochlorothiazide (HYDRODIURIL) 12.5 MG tablet Take 1 tablet (12.5 mg total) by mouth daily.  10/25/19  Yes Croitoru, Mihai, MD  levothyroxine (SYNTHROID, LEVOTHROID) 50 MCG tablet Take 50 mcg by mouth daily.   Yes [provider]  metoprolol succinate (TOPROL-XL) 25 MG 24 hr tablet TAKE 1 AND 1/2 TABLETS (37.5 MG TOTAL) DAILY. TAKE WITH OR IMMEDIATELY FOLLOWING A MEAL. DOSE CHANGE Patient taking differently: Take 37.5 mg by mouth daily. 07/22/21  Yes Croitoru, Dani Gobble, MD   DG Chest 1 View  Result Date: 08/26/2021 CLINICAL DATA:  Fall, pain EXAM: CHEST  1 VIEW COMPARISON:  Radiograph 08/18/2015 FINDINGS: Cardiomediastinal silhouette is within normal limits. Unchanged dual chamber pacemaker leads. No focal airspace consolidation. No pleural effusion. No pneumothorax. Bilateral shoulder degenerative changes. No acute  osseous abnormality. Thoracic spondylosis. IMPRESSION: No evidence of acute cardiopulmonary disease. Electronically Signed   By: Maurine Simmering M.D.   On: 08/26/2021 16:29   CT Head Wo Contrast  Result Date: 08/26/2021 CLINICAL DATA:  A 88 year old female presents for evaluation of pain following fall. EXAM: CT HEAD WITHOUT CONTRAST CT CERVICAL SPINE WITHOUT CONTRAST TECHNIQUE: Multidetector CT imaging of the head and cervical spine was performed following the standard protocol without intravenous contrast. Multiplanar CT image reconstructions of the cervical spine were also generated. RADIATION DOSE REDUCTION: This exam was performed according to the departmental dose-optimization program which includes automated exposure control, adjustment of the mA and/or kV according to patient size and/or use of iterative reconstruction technique. COMPARISON:  None FINDINGS: CT HEAD FINDINGS Brain: No evidence of acute infarction, hemorrhage, hydrocephalus, extra-axial collection or mass lesion/mass effect. Signs of generalized cerebral atrophy and chronic microvascular ischemic change. Vascular: No hyperdense vessel or unexpected calcification. Skull: Normal. Negative for fracture or focal lesion. Sinuses/Orbits: Visualized paranasal sinuses and orbits without acute finding Other: None. CT CERVICAL SPINE FINDINGS Alignment: Accentuated cervical lordosis. Extensive spinal degenerative changes. Head turned to the RIGHT. No frank listhesis. Degenerative changes include disc space narrowing and facet arthropathy. Skull base and vertebrae: A subtle calcification along the inferior margin of the lateral mass anteriorly of C1. Unfortunately there is streak artifact that passes through this area as well which limits assessment. There is no associated prevertebral soft tissue swelling in the area. No primary bone lesion or focal pathologic process. Loss of height at T1 is age indeterminate with mild loss of height of the T1 superior  endplate approximately 75% loss of height at this level. No associated prevertebral soft tissue swelling. Soft tissues and spinal canal: No prevertebral fluid or swelling. No visible canal hematoma. Disc levels: Signs of multilevel degenerative change of the cervical spine with facet arthropathy and disc space narrowing with uncovertebral spurring greatest at C5-6 and C6-7. Upper chest: Biapical pleural and parenchymal scarring. Other: None IMPRESSION: 1. No acute intracranial abnormality. 2. Signs of generalized cerebral atrophy and chronic microvascular ischemic change. 3. A subtle calcification along the anterior inferior aspect of the lateral mass of C1 (image 23/7) without associated prevertebral soft tissue swelling in the area. Difficult to exclude a subtle fracture in this location degenerative changes possible and unfortunately there is streak artifact in the area from dental hardware. 4. T1 superior endplate the fraction approximately 10% loss of height favors chronic injury in the absence of prevertebral soft tissue swelling. Correlate with any pain in this area. 5. Extensive spinal degenerative changes of the cervical spine with facet arthropathy and disc space narrowing greatest at C5-6 and C6-7. Electronically Signed   By: Zetta Bills M.D.   On: 08/26/2021 18:02   CT Cervical Spine Wo Contrast  Result Date: 08/26/2021 CLINICAL DATA:  A 88 year old female presents for evaluation of pain following fall. EXAM: CT HEAD WITHOUT CONTRAST CT CERVICAL SPINE WITHOUT CONTRAST TECHNIQUE: Multidetector CT imaging of the head and cervical spine was performed following the standard protocol without intravenous contrast. Multiplanar CT image reconstructions of the cervical spine were also generated. RADIATION DOSE REDUCTION: This exam was performed according to the departmental dose-optimization program which includes automated exposure control, adjustment of the mA and/or kV according to patient size and/or  use of iterative reconstruction technique. COMPARISON:  None FINDINGS: CT HEAD FINDINGS Brain: No evidence of acute infarction, hemorrhage, hydrocephalus, extra-axial collection or mass lesion/mass effect. Signs of generalized cerebral atrophy and chronic microvascular ischemic change. Vascular: No hyperdense vessel or unexpected calcification. Skull: Normal. Negative for fracture or focal lesion. Sinuses/Orbits: Visualized paranasal sinuses and orbits without acute finding Other: None. CT CERVICAL SPINE FINDINGS Alignment: Accentuated cervical lordosis. Extensive spinal degenerative changes. Head turned to the RIGHT. No frank listhesis. Degenerative changes include disc space narrowing and facet arthropathy. Skull base and vertebrae: A subtle calcification along the inferior margin of the lateral mass anteriorly of C1. Unfortunately there is streak artifact that passes through this area as well which limits assessment. There is no associated prevertebral soft tissue swelling in the area. No primary bone lesion or focal pathologic process. Loss of height at T1 is age indeterminate with mild loss of height of the T1 superior endplate approximately 35% loss of height at this level. No associated prevertebral soft tissue swelling. Soft tissues and spinal canal: No prevertebral fluid or swelling. No visible canal hematoma. Disc levels: Signs of multilevel degenerative change of the cervical spine with facet arthropathy and disc space narrowing with uncovertebral spurring greatest at C5-6 and C6-7. Upper chest: Biapical pleural and parenchymal scarring. Other: None IMPRESSION: 1. No acute intracranial abnormality. 2. Signs of generalized cerebral atrophy and chronic microvascular ischemic change. 3. A subtle calcification along the anterior inferior aspect of the lateral mass of C1 (image 23/7) without associated prevertebral soft tissue swelling in the area. Difficult to exclude a subtle fracture in this location  degenerative changes possible and unfortunately there is streak artifact in the area from dental hardware. 4. T1 superior endplate the fraction approximately 10% loss of height favors chronic injury in the absence of prevertebral soft tissue swelling. Correlate with any pain in this area. 5. Extensive spinal degenerative changes of the cervical spine with facet arthropathy and disc space narrowing greatest at C5-6 and C6-7. Electronically Signed   By: Zetta Bills M.D.   On: 08/26/2021 18:02   DG Hip Unilat W or Wo Pelvis 2-3 Views Left  Result Date: 08/26/2021 CLINICAL DATA:  Concern for fracture EXAM: DG HIP (WITH OR WITHOUT PELVIS) 2-3V LEFT COMPARISON:  None. FINDINGS: There is a displaced left femoral neck fracture with varus angulation. Mild bilateral hip osteoarthritis. IMPRESSION: Displaced left femoral neck fracture with varus angulation. Electronically Signed   By: Maurine Simmering M.D.   On: 08/26/2021 16:27     Positive ROS: All other systems have been reviewed and were otherwise negative with the exception of those mentioned in the HPI and as above.  Physical Exam: General: No acute distress Cardiovascular: No pedal edema Respiratory: No cyanosis, no use of accessory musculature GI: No organomegaly, abdomen is soft and non-tender Skin: No lesions in the area of chief complaint Neurologic: Sensation intact distally Psychiatric: Patient is at baseline mood and affect Lymphatic: No axillary or cervical lymphadenopathy  MUSCULOSKELETAL:  Left hip is shortened externally rotated.  Sensation is intact in all distributions of the left foot.  2+ dorsalis pedis pulse.  Independent Imaging Review: X-ray AP pelvis 2 views left hip: Displaced left femoral neck fracture  Assessment: 88 year old female with displaced left femoral neck fracture after a fall at home.  I discussed with both her, her daughter, and her son that given the fact that she is so independently mobile that I would  recommend surgery for hemiarthroplasty in order to improve her mobility status.  I discussed that this is in contrast without surgery which she would be at risk for the complications that come with being bedbound.  All are in agreement to proceed with hemiarthroplasty.  She has been seen and evaluated by the medical team.  Plan: Plan for left hip hemiarthroplasty today N.p.o. for OR today  Thank you for the consult and the opportunity to see Ms. Marlan Palau, MD [facility] 7:32 AM

## 2021-08-27 NOTE — Anesthesia Preprocedure Evaluation (Signed)
Anesthesia Evaluation  Patient identified by MRN, date of birth, ID band Patient awake    Reviewed: Allergy & Precautions, H&P , NPO status , Patient's Chart, lab work & pertinent test results  Airway Mallampati: II   Neck ROM: full    Dental   Pulmonary neg pulmonary ROS,    breath sounds clear to auscultation       Cardiovascular hypertension, + Peripheral Vascular Disease  + dysrhythmias + pacemaker  Rhythm:regular Rate:Normal     Neuro/Psych PSYCHIATRIC DISORDERS Anxiety    GI/Hepatic   Endo/Other  Hypothyroidism   Renal/GU      Musculoskeletal  (+) Arthritis ,   Abdominal   Peds  Hematology   Anesthesia Other Findings   Reproductive/Obstetrics                             Anesthesia Physical Anesthesia Plan  ASA: 3  Anesthesia Plan: General   Post-op Pain Management:    Induction: Intravenous  PONV Risk Score and Plan: 3 and Ondansetron, Dexamethasone and Treatment may vary due to age or medical condition  Airway Management Planned: Oral ETT  Additional Equipment:   Intra-op Plan:   Post-operative Plan: Extubation in OR  Informed Consent: I have reviewed the patients History and Physical, chart, labs and discussed the procedure including the risks, benefits and alternatives for the proposed anesthesia with the patient or authorized representative who has indicated his/her understanding and acceptance.     Dental advisory given  Plan Discussed with: CRNA, Anesthesiologist and Surgeon  Anesthesia Plan Comments:         Anesthesia Quick Evaluation

## 2021-08-27 NOTE — Progress Notes (Signed)
Pt transported to PACU via bed; alert and stable at baseline. Left hearing aid and glasses removed and given to family at bedside.

## 2021-08-27 NOTE — Progress Notes (Addendum)
PROGRESS NOTE    Katelyn Morris  XQJ:[id number] DOB: 1922/09/27 DOA: 08/26/2021 PCP: Lawerance Cruel, MD   Brief Narrative:  Katelyn Morris is a 88 y.o. female with medical history significant of HTN, hypothyroidism, SSS s/p PPM placement, CKD stage 3a, HLD, PAD, anxiety presented to ED after mechanical fall while working in the kitchen. She denies hitting her head or losing consciousness. She denies any neck pain. She had immediate pain to her left leg and couldn't bear weight.    Upon arrival to ED, she was hemodynamically stable.  She was diagnosed with displaced left femoral neck fracture. CT cervical: subtle calcification along the anterior inferior aspect of the lateral mass of C1 w/out associated prevertebral soft tissue swelling in the area. Cannot exclude subtle fracture but it could be artifact as well.  Patient with no neck pain.  Admitted under [place].  Orthopedics consulted.  Assessment & Plan:   Principal Problem:   Closed left hip fracture, initial encounter [facility]) Active Problems:   Hypothyroidism   Essential hypertension   SSS (sick sinus syndrome) s/p PPM   PAD (peripheral artery disease) (HCC)   Hypercholesterolemia   Anxiety   Abnormal CT scan, cervical spine   CKD (chronic kidney disease) stage 3, GFR 30-59 ml/min (HCC)  Closed left hip fracture, initial encounter ([place])- (present on admission): Pain with movement but otherwise no pain.  Patient is pleasant, fully alert and oriented.  Ortho consulted and patient scheduled for surgery today.  UTI: Start on Rocephin.  Follow culture.   Abnormal CT scan, cervical spine- (present on admission) Questionable C1 subtle fracture. Could not rule out due to artifact -patient denies hitting head and denies neck pain -continue to monitor    Hypercholesterolemia- (present on admission) Cholesterol well controlled, continue lipitor    Essential hypertension- (present on admission) Slightly HTN, but in pain and has  not had medication today History of severe orthostatic hypotension and per cardiology allow systolic bp 448-185 to prevent her from dropping  Continue her metoprolol (hx of PAT) , hold hctz at this time.    CKD (chronic kidney disease) stage 3, GFR 30-59 ml/min (HCC) baseline creatinine 1.1, stable.   PAD (peripheral artery disease) ([place])- (present on admission) Continue statin and pletal.  Resume aspirin postoperatively.   SSS (sick sinus syndrome) s/p PPM- (present on admission) PPM in 2013, regular interrogation checks, followed by cardiology  Denies any palpitations/chest pain prior to fall    Hypothyroidism- (present on admission) Continue synthroid TSH wnl 01/2021   Anxiety- (present on admission) Continue low dose xanax bid \  DVT prophylaxis: SCDs Start: 08/26/21 1851   Code Status: DNR  Family Communication:  None present at bedside.  Plan of care discussed with patient in length and he/she verbalized understanding and agreed with it.  Status is: Inpatient Remains inpatient appropriate because: Needs hip surgery.  Estimated body mass index is 21.03 kg/m as calculated from the following:   Height as of this encounter: 5\' 2"  (1.575 m).   Weight as of this encounter: 52.2 kg.    Nutritional Assessment: Body mass index is 21.03 kg/m.Marland Kitchen Seen by dietician.  I agree with the assessment and plan as outlined below: Nutrition Status:        . Skin Assessment: I have examined the patient's skin and I agree with the wound assessment as performed by the wound care RN as outlined below:    Consultants:  Orthopedics  Procedures:  None  Antimicrobials:  Anti-infectives (From  admission, onward)    Start     Dose/Rate Route Frequency Ordered Stop   08/27/21 0915  ceFAZolin (ANCEF) IVPB 2g/100 mL premix        2 g 200 mL/hr over 30 Minutes Intravenous On call to O.R. 08/27/21 5956 08/28/21 0559   08/27/21 0915  cefTRIAXone (ROCEPHIN) 1 g in sodium chloride 0.9 % 100  mL IVPB        1 g 200 mL/hr over 30 Minutes Intravenous Every 24 hours 08/27/21 3875           Subjective: Patient seen and examined.  She has no new complaint, left hip pain with movement only.  Objective: Vitals:   08/27/21 0400 08/27/21 0445 08/27/21 0634 08/27/21 0839  BP: (!) 144/56 (!) 124/52 (!) 158/52 (!) 165/61  Pulse: 60 60 (!) 58 63  Resp: 12 11 15 18   Temp:   98.1 F (36.7 C) 98.5 F (36.9 C)  TempSrc:   Oral Oral  SpO2: 100% 100% 100% 92%  Weight:      Height:        Intake/Output Summary (Last 24 hours) at 08/27/2021 1036 Last data filed at 08/27/2021 0600 Gross per 24 hour  Intake 807.85 ml  Output --  Net 807.85 ml   Filed Weights   08/26/21 1519  Weight: 52.2 kg    Examination:  General exam: Appears calm and comfortable  Respiratory system: Clear to auscultation. Respiratory effort normal. Cardiovascular system: S1 & S2 heard, RRR. No JVD, murmurs, rubs, gallops or clicks. No pedal edema. Gastrointestinal system: Abdomen is nondistended, soft and nontender. No organomegaly or masses felt. Normal bowel sounds heard. Central nervous system: Alert and oriented. No focal neurological deficits. Psychiatry: Judgement and insight appear normal. Mood & affect appropriate.    Data Reviewed: I have personally reviewed following labs and imaging studies  CBC: Recent Labs  Lab 08/26/21 1630 08/27/21 0415  WBC 10.3 10.5  NEUTROABS 8.8*  --   HGB 12.6 12.2  HCT 38.6 36.5  MCV 91.5 90.1  PLT 184 643   Basic Metabolic Panel: Recent Labs  Lab 08/26/21 1630 08/27/21 0415  NA 140 138  K 4.0 3.7  CL 105 103  CO2 26 28  GLUCOSE 105* 107*  BUN 21 18  CREATININE 0.92 0.86  CALCIUM 9.2 8.7*   GFR: Estimated Creatinine Clearance: 30.3 mL/min (by C-G formula based on SCr of 0.86 mg/dL). Liver Function Tests: Recent Labs  Lab 08/26/21 1630  AST 27  ALT 27  ALKPHOS 58  BILITOT 1.1  PROT 6.0*  ALBUMIN 3.5   No results for input(s): LIPASE,  AMYLASE in the last 168 hours. No results for input(s): AMMONIA in the last 168 hours. Coagulation Profile: Recent Labs  Lab 08/26/21 1630  INR 1.1   Cardiac Enzymes: Recent Labs  Lab 08/27/21 0415  CKTOTAL 663*   BNP (last 3 results) No results for input(s): PROBNP in the last 8760 hours. HbA1C: No results for input(s): HGBA1C in the last 72 hours. CBG: No results for input(s): GLUCAP in the last 168 hours. Lipid Profile: No results for input(s): CHOL, HDL, LDLCALC, TRIG, CHOLHDL, LDLDIRECT in the last 72 hours. Thyroid Function Tests: No results for input(s): TSH, T4TOTAL, FREET4, T3FREE, THYROIDAB in the last 72 hours. Anemia Panel: No results for input(s): VITAMINB12, FOLATE, FERRITIN, TIBC, IRON, RETICCTPCT in the last 72 hours. Sepsis Labs: No results for input(s): PROCALCITON, LATICACIDVEN in the last 168 hours.  Recent Results (from the past 240  hour(s))  Resp Panel by RT-PCR (Flu A&B, Covid) Nasopharyngeal Swab     Status: None   Collection Time: 08/26/21  3:02 AM   Specimen: Nasopharyngeal Swab; Nasopharyngeal(NP) swabs in vial transport medium  Result Value Ref Range Status   SARS Coronavirus 2 by RT PCR NEGATIVE NEGATIVE Final    Comment: (NOTE) SARS-CoV-2 target nucleic acids are NOT DETECTED.  The SARS-CoV-2 RNA is generally detectable in upper respiratory specimens during the acute phase of infection. The lowest concentration of SARS-CoV-2 viral copies this assay can detect is 138 copies/mL. A negative result does not preclude SARS-Cov-2 infection and should not be used as the sole basis for treatment or other patient management decisions. A negative result may occur with  improper specimen collection/handling, submission of specimen other than nasopharyngeal swab, presence of viral mutation(s) within the areas targeted by this assay, and inadequate number of viral copies(<138 copies/mL). A negative result must be combined with clinical observations,  patient history, and epidemiological information. The expected result is Negative.  Fact Sheet for Patients:  EntrepreneurPulse.com.au  Fact Sheet for Healthcare Providers:  IncredibleEmployment.be  This test is no t yet approved or cleared by the [place] FDA and  has been authorized for detection and/or diagnosis of SARS-CoV-2 by FDA under an Emergency Use Authorization (EUA). This EUA will remain  in effect (meaning this test can be used) for the duration of the COVID-19 declaration under Section 564(b)(1) of the Act, 21 U.S.C.section 360bbb-3(b)(1), unless the authorization is terminated  or revoked sooner.       Influenza A by PCR NEGATIVE NEGATIVE Final   Influenza B by PCR NEGATIVE NEGATIVE Final    Comment: (NOTE) The Xpert Xpress SARS-CoV-2/FLU/RSV plus assay is intended as an aid in the diagnosis of influenza from Nasopharyngeal swab specimens and should not be used as a sole basis for treatment. Nasal washings and aspirates are unacceptable for Xpert Xpress SARS-CoV-2/FLU/RSV testing.  Fact Sheet for Patients: EntrepreneurPulse.com.au  Fact Sheet for Healthcare Providers: IncredibleEmployment.be  This test is not yet approved or cleared by the [place] FDA and has been authorized for detection and/or diagnosis of SARS-CoV-2 by FDA under an Emergency Use Authorization (EUA). This EUA will remain in effect (meaning this test can be used) for the duration of the COVID-19 declaration under Section 564(b)(1) of the Act, 21 U.S.C. section 360bbb-3(b)(1), unless the authorization is terminated or revoked.  Performed at [facility], [place] [address] [place], [place] [address]      Radiology Studies: DG Chest 1 View  Result Date: 08/26/2021 CLINICAL DATA:  Fall, pain EXAM: CHEST  1 VIEW COMPARISON:  Radiograph 08/18/2015 FINDINGS: Cardiomediastinal silhouette is within normal  limits. Unchanged dual chamber pacemaker leads. No focal airspace consolidation. No pleural effusion. No pneumothorax. Bilateral shoulder degenerative changes. No acute osseous abnormality. Thoracic spondylosis. IMPRESSION: No evidence of acute cardiopulmonary disease. Electronically Signed   By: Maurine Simmering M.D.   On: 08/26/2021 16:29   CT Head Wo Contrast  Result Date: 08/26/2021 CLINICAL DATA:  A 88 year old female presents for evaluation of pain following fall. EXAM: CT HEAD WITHOUT CONTRAST CT CERVICAL SPINE WITHOUT CONTRAST TECHNIQUE: Multidetector CT imaging of the head and cervical spine was performed following the standard protocol without intravenous contrast. Multiplanar CT image reconstructions of the cervical spine were also generated. RADIATION DOSE REDUCTION: This exam was performed according to the departmental dose-optimization program which includes automated exposure control, adjustment of the mA and/or kV according to patient size and/or  use of iterative reconstruction technique. COMPARISON:  None FINDINGS: CT HEAD FINDINGS Brain: No evidence of acute infarction, hemorrhage, hydrocephalus, extra-axial collection or mass lesion/mass effect. Signs of generalized cerebral atrophy and chronic microvascular ischemic change. Vascular: No hyperdense vessel or unexpected calcification. Skull: Normal. Negative for fracture or focal lesion. Sinuses/Orbits: Visualized paranasal sinuses and orbits without acute finding Other: None. CT CERVICAL SPINE FINDINGS Alignment: Accentuated cervical lordosis. Extensive spinal degenerative changes. Head turned to the RIGHT. No frank listhesis. Degenerative changes include disc space narrowing and facet arthropathy. Skull base and vertebrae: A subtle calcification along the inferior margin of the lateral mass anteriorly of C1. Unfortunately there is streak artifact that passes through this area as well which limits assessment. There is no associated prevertebral soft  tissue swelling in the area. No primary bone lesion or focal pathologic process. Loss of height at T1 is age indeterminate with mild loss of height of the T1 superior endplate approximately 27% loss of height at this level. No associated prevertebral soft tissue swelling. Soft tissues and spinal canal: No prevertebral fluid or swelling. No visible canal hematoma. Disc levels: Signs of multilevel degenerative change of the cervical spine with facet arthropathy and disc space narrowing with uncovertebral spurring greatest at C5-6 and C6-7. Upper chest: Biapical pleural and parenchymal scarring. Other: None IMPRESSION: 1. No acute intracranial abnormality. 2. Signs of generalized cerebral atrophy and chronic microvascular ischemic change. 3. A subtle calcification along the anterior inferior aspect of the lateral mass of C1 (image 23/7) without associated prevertebral soft tissue swelling in the area. Difficult to exclude a subtle fracture in this location degenerative changes possible and unfortunately there is streak artifact in the area from dental hardware. 4. T1 superior endplate the fraction approximately 10% loss of height favors chronic injury in the absence of prevertebral soft tissue swelling. Correlate with any pain in this area. 5. Extensive spinal degenerative changes of the cervical spine with facet arthropathy and disc space narrowing greatest at C5-6 and C6-7. Electronically Signed   By: Zetta Bills M.D.   On: 08/26/2021 18:02   CT Cervical Spine Wo Contrast  Result Date: 08/26/2021 CLINICAL DATA:  A 88 year old female presents for evaluation of pain following fall. EXAM: CT HEAD WITHOUT CONTRAST CT CERVICAL SPINE WITHOUT CONTRAST TECHNIQUE: Multidetector CT imaging of the head and cervical spine was performed following the standard protocol without intravenous contrast. Multiplanar CT image reconstructions of the cervical spine were also generated. RADIATION DOSE REDUCTION: This exam was  performed according to the departmental dose-optimization program which includes automated exposure control, adjustment of the mA and/or kV according to patient size and/or use of iterative reconstruction technique. COMPARISON:  None FINDINGS: CT HEAD FINDINGS Brain: No evidence of acute infarction, hemorrhage, hydrocephalus, extra-axial collection or mass lesion/mass effect. Signs of generalized cerebral atrophy and chronic microvascular ischemic change. Vascular: No hyperdense vessel or unexpected calcification. Skull: Normal. Negative for fracture or focal lesion. Sinuses/Orbits: Visualized paranasal sinuses and orbits without acute finding Other: None. CT CERVICAL SPINE FINDINGS Alignment: Accentuated cervical lordosis. Extensive spinal degenerative changes. Head turned to the RIGHT. No frank listhesis. Degenerative changes include disc space narrowing and facet arthropathy. Skull base and vertebrae: A subtle calcification along the inferior margin of the lateral mass anteriorly of C1. Unfortunately there is streak artifact that passes through this area as well which limits assessment. There is no associated prevertebral soft tissue swelling in the area. No primary bone lesion or focal pathologic process. Loss of height at T1 is age  indeterminate with mild loss of height of the T1 superior endplate approximately 89% loss of height at this level. No associated prevertebral soft tissue swelling. Soft tissues and spinal canal: No prevertebral fluid or swelling. No visible canal hematoma. Disc levels: Signs of multilevel degenerative change of the cervical spine with facet arthropathy and disc space narrowing with uncovertebral spurring greatest at C5-6 and C6-7. Upper chest: Biapical pleural and parenchymal scarring. Other: None IMPRESSION: 1. No acute intracranial abnormality. 2. Signs of generalized cerebral atrophy and chronic microvascular ischemic change. 3. A subtle calcification along the anterior inferior  aspect of the lateral mass of C1 (image 23/7) without associated prevertebral soft tissue swelling in the area. Difficult to exclude a subtle fracture in this location degenerative changes possible and unfortunately there is streak artifact in the area from dental hardware. 4. T1 superior endplate the fraction approximately 10% loss of height favors chronic injury in the absence of prevertebral soft tissue swelling. Correlate with any pain in this area. 5. Extensive spinal degenerative changes of the cervical spine with facet arthropathy and disc space narrowing greatest at C5-6 and C6-7. Electronically Signed   By: Zetta Bills M.D.   On: 08/26/2021 18:02   DG Hip Unilat W or Wo Pelvis 2-3 Views Left  Result Date: 08/26/2021 CLINICAL DATA:  Concern for fracture EXAM: DG HIP (WITH OR WITHOUT PELVIS) 2-3V LEFT COMPARISON:  None. FINDINGS: There is a displaced left femoral neck fracture with varus angulation. Mild bilateral hip osteoarthritis. IMPRESSION: Displaced left femoral neck fracture with varus angulation. Electronically Signed   By: Maurine Simmering M.D.   On: 08/26/2021 16:27    Scheduled Meds:  atorvastatin  10 mg Oral Daily   cilostazol  50 mg Oral BID   levothyroxine  50 mcg Oral Q0600   metoprolol succinate  37.5 mg Oral Daily   Continuous Infusions:   ceFAZolin (ANCEF) IV     cefTRIAXone (ROCEPHIN)  IV     methocarbamol (ROBAXIN) IV Stopped (08/27/21 0506)   tranexamic acid       LOS: 1 day   Time spent: 31 minutes  Darliss Cheney, MD [facility]  08/27/2021, 10:36 AM  Please page via [facility] and do not message via secure chat for urgent patient care matters. Secure chat can be used for non urgent patient care matters.  How to contact the [facility] Attending or Consulting provider [place] or covering provider during after hours [place], for this patient?  Check the care team in [facility] and look for a) attending/consulting [facility] provider listed and b) the [facility] team listed. Page or secure  chat [phone]. Log into [email or web address] and use [place]'s universal password to access. If you do not have the password, please contact the hospital operator. Locate the [facility] provider you are looking for under [facility] and page to a number that you can be directly reached. If you still have difficulty reaching the provider, please page the [facility] (Director on Call) for the Hospitalists listed on amion for assistance.

## 2021-08-27 NOTE — ED Notes (Addendum)
Pt reporting cramping along lower back. PRN pain medication administered and patient repositioned with relief. Pt is pleasantly confused at this time, carries conversation, follows directions.

## 2021-08-27 NOTE — Progress Notes (Signed)
Remote pacemaker transmission.   

## 2021-08-27 NOTE — Op Note (Signed)
Date of Surgery: 08/27/2021  INDICATIONS: [name] is a 88 y.o.-year-old female with neck fracture.  The risk and benefits of the procedure with discussed in detail and documented in the pre-operative evaluation.  PREOPERATIVE DIAGNOSIS: 1.  Left hip displaced femoral neck fracture  POSTOPERATIVE DIAGNOSIS: Same.  PROCEDURE: 1.  Left hip hemiarthroplasty  SURGEON: Yevonne Pax MD  ASSISTANT: Lennice Sites, Daisy Angelito  ANESTHESIA: Spinal plus MAC  IV FLUIDS AND URINE: See anesthesia record.  ANTIBIOTICS: Ancef 2 g  ESTIMATED BLOOD LOSS: 50 mL.  IMPLANTS:  Implant Name Type Inv. Item Serial No. Manufacturer Lot No. LRB No. Used Action  STEM FEM SZ3 STD ACTIS - [id number] Stem STEM FEM SZ3 STD ACTIS  [employer] [id number] Left 1 Implanted  HEAD FEM UNIPOLAR 45 OD STRL - [id number] Hips HEAD FEM UNIPOLAR 45 OD STRL  [employer]  Left 1 Implanted  SPACER DEPUY - [id number] Hips SPACER DEPUY  [employer]PU[employer]TH[employer] [id number] Left 1 Implanted    DRAINS: None  CULTURES: None  COMPLICATIONS: none  DESCRIPTION OF PROCEDURE:  The patient was identified in the preoperative holding area.  Correct site was marked according to universal protocol with nursing.  She was subsequently taken back to the operating room.  Anesthesia was induced.  Epidural was performed.  She was subsequently transferred over to the operative Hana table.  She was prepped and draped in the usual sterile fashion.  Initial x-rays were obtained with both legs in plane in order to view the leg length at the level of the lesser trochanter.  Final timeout was performed.  Ancef was given 1 hour prior to skin incision.  Direct anterior approach was utilized.  15 blade was used to incise through skin.  The plane between the sartorius and IT band fascia was opened with a 15 blade.  This was dissected bluntly.  Then visualized the crossing vessels of the lateral circumflex.  These were cauterized.  At this time a  Deatra Canter was then placed about the superior neck in order to explore the interval between the rectus and gluteus medius.  1 was placed inferior and superior to the neck.  A T capsulotomy was created with a base at the base of the femoral neck.  At this time the [facility] were then translated to under the capsule.  The capsule was tagged with a #1 Ethibond.  The femoral neck cut was revised with direct fluoroscopic visualization.  The femoral head was then removed and sized.  At this time the acetabulum was irrigated free of bony fragments.  The leg was brought into external rotation and down onto the floor in order to expose the proximal femur in conjunction with the Hana hook.  Posterior trochanter as well as medial retractors were used to expose the neck.  We subsequently used the opening cookie cutter and subsequently broached up to a size 3.  A calcar plane was then used.  At this time we trialed using a minus head which showed excellent limb lengths compared to contralateral side.  At this time the hip was then dislocated with traction and external rotation.  The trial broach was removed.  The femur was thoroughly irrigated.  The final stem was then placed and reduced.  This had again excellent and nearly equal limb lengths.  Offset was similar.  The wound was finally thoroughly irrigated 1 g of vancomycin powder was placed.  The capsule was closed in a running fashion with a 1 Ethibond.  The  interval between the IT band and sartorius was closed with 2-0 Vicryl and the skin was closed with staples.  Aquacel dressing was applied.  All counts were correct at the end of the case patient was awoken and taken to the PACU without complication     POSTOPERATIVE PLAN: Patient will be weightbearing as tolerated on the left lower extremity she will be seen by physical therapy and mobilize as tolerated.  Back in my office for staple removal at 2 weeks  Yevonne Pax, MD 5:22 PM

## 2021-08-27 NOTE — H&P (View-Only) (Signed)
ORTHOPAEDIC CONSULTATION  REQUESTING PHYSICIAN: Orma Flaming, MD  Chief Complaint: Left hip pain  HPI: Katelyn Morris is a 88 y.o. female who presents with left hip pain after a fall while at home.  Of note she does live independently and uses a walker for ambulation.  Her son went to check on her and drop off lunch and found her down.  She subsequently has had left hip pain and inability to bear weight.  Found to have a femoral neck fracture in the emergency room.  She is here today with her son as well as her daughter-in-law who is her medical power of attorney.  She is otherwise fairly mobile and healthy and lives alone.  She has a DNR in place  Past Medical History:  Diagnosis Date   Arthritis    in knees per patient   Dysrhythmia    Bradycardia   Heart murmur    Hypertension    Hypothyroidism    Pacemaker 02/20/2012   dual chamber   SSS (sick sinus syndrome) ([place]) 08/06/2014   Past Surgical History:  Procedure Laterality Date   ABDOMINAL HYSTERECTOMY     bladder tack     INSERT / REPLACE / REMOVE PACEMAKER     PERMANENT PACEMAKER INSERTION N/A 02/20/2012   Procedure: PERMANENT PACEMAKER INSERTION;  Surgeon: Sanda Klein, MD;  Location: [facility];  Service: Cardiovascular;  Laterality: N/A;   ROTATOR CUFF REPAIR     bilateral   Social History   Socioeconomic History   Marital status: Widowed    Spouse name: Not on file   Number of children: Not on file   Years of education: Not on file   Highest education level: Not on file  Occupational History   Not on file  Tobacco Use   Smoking status: Never   Smokeless tobacco: Never  Substance and Sexual Activity   Alcohol use: No   Drug use: No   Sexual activity: Never    Birth control/protection: Post-menopausal  Other Topics Concern   Not on file  Social History Narrative   Not on file   Social Determinants of Health   Financial Resource Strain: Not on file  Food Insecurity: Not on file  Transportation  Needs: Not on file  Physical Activity: Not on file  Stress: Not on file  Social Connections: Not on file   No family history on file. - negative except otherwise stated in the family history section Allergies  Allergen Reactions   Trazodone Hcl     Other reaction(s): more restless,  drawing in back of neck   Sulfa Antibiotics Swelling   Sulfonamide Derivatives Swelling   Prior to Admission medications   Medication Sig Start Date End Date Taking? Authorizing Provider  ALPRAZolam (XANAX) 0.25 MG tablet Take 0.125 mg by mouth at bedtime.   Yes [provider]  aspirin 81 MG tablet Take 81 mg by mouth daily.   Yes [provider]  atorvastatin (LIPITOR) 10 MG tablet TAKE 1 TABLET EVERY DAY Patient taking differently: Take 10 mg by mouth every evening. 04/19/21  Yes Croitoru, Mihai, MD  Cholecalciferol (VITAMIN D) 50 MCG (2000 UT) CAPS Take 1 capsule by mouth daily.   Yes [provider]  cilostazol (PLETAL) 50 MG tablet TAKE 1 TABLET TWICE DAILY Patient taking differently: Take 50 mg by mouth 2 (two) times daily. 04/19/21  Yes Croitoru, Mihai, MD  hydrochlorothiazide (HYDRODIURIL) 12.5 MG tablet Take 1 tablet (12.5 mg total) by mouth daily.  10/25/19  Yes Croitoru, Mihai, MD  levothyroxine (SYNTHROID, LEVOTHROID) 50 MCG tablet Take 50 mcg by mouth daily.   Yes [provider]  metoprolol succinate (TOPROL-XL) 25 MG 24 hr tablet TAKE 1 AND 1/2 TABLETS (37.5 MG TOTAL) DAILY. TAKE WITH OR IMMEDIATELY FOLLOWING A MEAL. DOSE CHANGE Patient taking differently: Take 37.5 mg by mouth daily. 07/22/21  Yes Croitoru, Dani Gobble, MD   DG Chest 1 View  Result Date: 08/26/2021 CLINICAL DATA:  Fall, pain EXAM: CHEST  1 VIEW COMPARISON:  Radiograph 08/18/2015 FINDINGS: Cardiomediastinal silhouette is within normal limits. Unchanged dual chamber pacemaker leads. No focal airspace consolidation. No pleural effusion. No pneumothorax. Bilateral shoulder degenerative changes. No acute  osseous abnormality. Thoracic spondylosis. IMPRESSION: No evidence of acute cardiopulmonary disease. Electronically Signed   By: Maurine Simmering M.D.   On: 08/26/2021 16:29   CT Head Wo Contrast  Result Date: 08/26/2021 CLINICAL DATA:  A 88 year old female presents for evaluation of pain following fall. EXAM: CT HEAD WITHOUT CONTRAST CT CERVICAL SPINE WITHOUT CONTRAST TECHNIQUE: Multidetector CT imaging of the head and cervical spine was performed following the standard protocol without intravenous contrast. Multiplanar CT image reconstructions of the cervical spine were also generated. RADIATION DOSE REDUCTION: This exam was performed according to the departmental dose-optimization program which includes automated exposure control, adjustment of the mA and/or kV according to patient size and/or use of iterative reconstruction technique. COMPARISON:  None FINDINGS: CT HEAD FINDINGS Brain: No evidence of acute infarction, hemorrhage, hydrocephalus, extra-axial collection or mass lesion/mass effect. Signs of generalized cerebral atrophy and chronic microvascular ischemic change. Vascular: No hyperdense vessel or unexpected calcification. Skull: Normal. Negative for fracture or focal lesion. Sinuses/Orbits: Visualized paranasal sinuses and orbits without acute finding Other: None. CT CERVICAL SPINE FINDINGS Alignment: Accentuated cervical lordosis. Extensive spinal degenerative changes. Head turned to the RIGHT. No frank listhesis. Degenerative changes include disc space narrowing and facet arthropathy. Skull base and vertebrae: A subtle calcification along the inferior margin of the lateral mass anteriorly of C1. Unfortunately there is streak artifact that passes through this area as well which limits assessment. There is no associated prevertebral soft tissue swelling in the area. No primary bone lesion or focal pathologic process. Loss of height at T1 is age indeterminate with mild loss of height of the T1 superior  endplate approximately 75% loss of height at this level. No associated prevertebral soft tissue swelling. Soft tissues and spinal canal: No prevertebral fluid or swelling. No visible canal hematoma. Disc levels: Signs of multilevel degenerative change of the cervical spine with facet arthropathy and disc space narrowing with uncovertebral spurring greatest at C5-6 and C6-7. Upper chest: Biapical pleural and parenchymal scarring. Other: None IMPRESSION: 1. No acute intracranial abnormality. 2. Signs of generalized cerebral atrophy and chronic microvascular ischemic change. 3. A subtle calcification along the anterior inferior aspect of the lateral mass of C1 (image 23/7) without associated prevertebral soft tissue swelling in the area. Difficult to exclude a subtle fracture in this location degenerative changes possible and unfortunately there is streak artifact in the area from dental hardware. 4. T1 superior endplate the fraction approximately 10% loss of height favors chronic injury in the absence of prevertebral soft tissue swelling. Correlate with any pain in this area. 5. Extensive spinal degenerative changes of the cervical spine with facet arthropathy and disc space narrowing greatest at C5-6 and C6-7. Electronically Signed   By: Zetta Bills M.D.   On: 08/26/2021 18:02   CT Cervical Spine Wo Contrast  Result Date: 08/26/2021 CLINICAL DATA:  A 88 year old female presents for evaluation of pain following fall. EXAM: CT HEAD WITHOUT CONTRAST CT CERVICAL SPINE WITHOUT CONTRAST TECHNIQUE: Multidetector CT imaging of the head and cervical spine was performed following the standard protocol without intravenous contrast. Multiplanar CT image reconstructions of the cervical spine were also generated. RADIATION DOSE REDUCTION: This exam was performed according to the departmental dose-optimization program which includes automated exposure control, adjustment of the mA and/or kV according to patient size and/or  use of iterative reconstruction technique. COMPARISON:  None FINDINGS: CT HEAD FINDINGS Brain: No evidence of acute infarction, hemorrhage, hydrocephalus, extra-axial collection or mass lesion/mass effect. Signs of generalized cerebral atrophy and chronic microvascular ischemic change. Vascular: No hyperdense vessel or unexpected calcification. Skull: Normal. Negative for fracture or focal lesion. Sinuses/Orbits: Visualized paranasal sinuses and orbits without acute finding Other: None. CT CERVICAL SPINE FINDINGS Alignment: Accentuated cervical lordosis. Extensive spinal degenerative changes. Head turned to the RIGHT. No frank listhesis. Degenerative changes include disc space narrowing and facet arthropathy. Skull base and vertebrae: A subtle calcification along the inferior margin of the lateral mass anteriorly of C1. Unfortunately there is streak artifact that passes through this area as well which limits assessment. There is no associated prevertebral soft tissue swelling in the area. No primary bone lesion or focal pathologic process. Loss of height at T1 is age indeterminate with mild loss of height of the T1 superior endplate approximately 26% loss of height at this level. No associated prevertebral soft tissue swelling. Soft tissues and spinal canal: No prevertebral fluid or swelling. No visible canal hematoma. Disc levels: Signs of multilevel degenerative change of the cervical spine with facet arthropathy and disc space narrowing with uncovertebral spurring greatest at C5-6 and C6-7. Upper chest: Biapical pleural and parenchymal scarring. Other: None IMPRESSION: 1. No acute intracranial abnormality. 2. Signs of generalized cerebral atrophy and chronic microvascular ischemic change. 3. A subtle calcification along the anterior inferior aspect of the lateral mass of C1 (image 23/7) without associated prevertebral soft tissue swelling in the area. Difficult to exclude a subtle fracture in this location  degenerative changes possible and unfortunately there is streak artifact in the area from dental hardware. 4. T1 superior endplate the fraction approximately 10% loss of height favors chronic injury in the absence of prevertebral soft tissue swelling. Correlate with any pain in this area. 5. Extensive spinal degenerative changes of the cervical spine with facet arthropathy and disc space narrowing greatest at C5-6 and C6-7. Electronically Signed   By: Zetta Bills M.D.   On: 08/26/2021 18:02   DG Hip Unilat W or Wo Pelvis 2-3 Views Left  Result Date: 08/26/2021 CLINICAL DATA:  Concern for fracture EXAM: DG HIP (WITH OR WITHOUT PELVIS) 2-3V LEFT COMPARISON:  None. FINDINGS: There is a displaced left femoral neck fracture with varus angulation. Mild bilateral hip osteoarthritis. IMPRESSION: Displaced left femoral neck fracture with varus angulation. Electronically Signed   By: Maurine Simmering M.D.   On: 08/26/2021 16:27     Positive ROS: All other systems have been reviewed and were otherwise negative with the exception of those mentioned in the HPI and as above.  Physical Exam: General: No acute distress Cardiovascular: No pedal edema Respiratory: No cyanosis, no use of accessory musculature GI: No organomegaly, abdomen is soft and non-tender Skin: No lesions in the area of chief complaint Neurologic: Sensation intact distally Psychiatric: Patient is at baseline mood and affect Lymphatic: No axillary or cervical lymphadenopathy  MUSCULOSKELETAL:  Left hip is shortened externally rotated.  Sensation is intact in all distributions of the left foot.  2+ dorsalis pedis pulse.  Independent Imaging Review: X-ray AP pelvis 2 views left hip: Displaced left femoral neck fracture  Assessment: 88 year old female with displaced left femoral neck fracture after a fall at home.  I discussed with both her, her daughter, and her son that given the fact that she is so independently mobile that I would  recommend surgery for hemiarthroplasty in order to improve her mobility status.  I discussed that this is in contrast without surgery which she would be at risk for the complications that come with being bedbound.  All are in agreement to proceed with hemiarthroplasty.  She has been seen and evaluated by the medical team.  Plan: Plan for left hip hemiarthroplasty today N.p.o. for OR today  Thank you for the consult and the opportunity to see Ms. Marlan Palau, MD [facility] 7:32 AM

## 2021-08-27 NOTE — Brief Op Note (Signed)
° °  Brief Op Note  Date of Surgery: 08/27/2021  Preoperative Diagnosis: Left femoral neck fracture  Postoperative Diagnosis: same  Procedure: Procedure(s): HEMI HIP ARTHROPLASTY ANTERIOR APPROACH  Implants: Implant Name Type Inv. Item Serial No. Manufacturer Lot No. LRB No. Used Action  STEM FEM SZ3 STD ACTIS - [id number] Stem STEM FEM SZ3 STD ACTIS  [employer]PU[employer] [id number] Left 1 Implanted  HEAD FEM UNIPOLAR 45 OD STRL - [id number] Hips HEAD FEM UNIPOLAR 45 OD STRL  [employer]  Left 1 Implanted  SPACER DEPUY - [id number] Hips SPACER DEPUY  DEPU[employer]TH[employer] [id number] Left 1 Implanted    Surgeons: Surgeon(s): Vanetta Mulders, MD  Anesthesia: Choice    Estimated Blood Loss: See anesthesia record  Complications: None  Condition to PACU: Stable  Yevonne Pax, MD 08/27/2021 5:22 PM

## 2021-08-27 NOTE — ED Notes (Signed)
Pt moved to hospital bed and positioned for comfort.

## 2021-08-27 NOTE — Anesthesia Procedure Notes (Signed)
Procedure Name: MAC Date/Time: 08/27/2021 3:50 PM Performed by: Erick Colace, CRNA Oxygen Delivery Method: Simple face mask

## 2021-08-27 NOTE — Anesthesia Procedure Notes (Signed)
Spinal  Start time: 08/27/2021 3:59 PM End time: 08/27/2021 4:02 PM Reason for block: surgical anesthesia Staffing Performed: anesthesiologist  Anesthesiologist: Effie Berkshire, MD Preanesthetic Checklist Completed: patient identified, IV checked, site marked, risks and benefits discussed, surgical consent, monitors and equipment checked, pre-op evaluation and timeout performed Spinal Block Patient position: left lateral decubitus Prep: DuraPrep and site prepped and draped Location: L3-4 Injection technique: single-shot Needle Needle type: Pencan  Needle gauge: 24 G Needle length: 10 cm Needle insertion depth: 10 cm Additional Notes Patient tolerated well. No immediate complications.  Functioning IV was confirmed and monitors were applied. Sterile prep and drape, including hand hygiene and sterile gloves were used. The patient was positioned and the back was prepped. The skin was anesthetized with lidocaine. Free flow of clear CSF was obtained prior to injecting local anesthetic into the CSF. The spinal needle aspirated freely following injection. The needle was carefully withdrawn. The patient tolerated the procedure well.

## 2021-08-27 NOTE — Progress Notes (Signed)
Initial Nutrition Assessment  DOCUMENTATION CODES:  Not applicable  INTERVENTION:  Advanced diet to regular after surgery, encourage PO intake Consider checking vitamin d levels to determine if supplementation is needed Ensure Enlive po 1x/d, each supplement provides 350 kcal and 20 grams of protein.  NUTRITION DIAGNOSIS:  Increased nutrient needs related to hip fracture as evidenced by estimated needs.  GOAL:  Patient will meet greater than or equal to 90% of their needs  MONITOR:  PO intake, Diet advancement  REASON FOR ASSESSMENT:  Consult Hip fracture protocol  ASSESSMENT:  88 y.o. year-old female with history of CKD3, HLD, Hypothyroidism, HTN and PAD presented to ED with pain after a fall at home.   Imaging in ED showed acute femoral neck fracture. Surgery planned for this afternoon (2/3)  Pt out of room for surgery at the time of assessment. Reviewed chart, most recent weight is from 11 months ago and shows ~7% weight loss (10/05/20-08/26/21) which is not severe but undesirable in the setting of pt's advanced age.  At baseline, pt lives alone and is independent of ADLs.   Will monitor intake after surgery to ensure that nutrition needs for recovery are being met.   Nutritionally Relevant Medications: Scheduled Meds:  atorvastatin  10 mg Oral Daily   Continuous Infusions:  cefTRIAXone (ROCEPHIN)  IV 1 g (08/27/21 0912)   lactated ringers 10 mL/hr at 08/27/21 1428   PRN Meds: ondansetron   Labs Reviewed  NUTRITION - FOCUSED PHYSICAL EXAM: Defer to in-person assessment  Diet Order:   Diet Order             Diet NPO time specified  Diet effective now                   EDUCATION NEEDS:  No education needs have been identified at this time  Skin:  Skin Assessment: Reviewed RN Assessment  Last BM:  PTA  Height:  Ht Readings from Last 1 Encounters:  08/26/21 $RemoveB'5\' 2"'MuOJjAsH$  (1.575 m)    Weight:  Wt Readings from Last 1 Encounters:  08/26/21 52.2 kg     Ideal Body Weight:  50 kg  BMI:  Body mass index is 21.03 kg/m.  Estimated Nutritional Needs:  Kcal:  1300-1500 kcal/d Protein:  65-75 g/d Fluid:  >1546mL/d   Ranell Patrick, RD, LDN Clinical Dietitian RD pager # available in [facility]  After hours/weekend pager # available in [facility]

## 2021-08-28 LAB — CBC WITH DIFFERENTIAL/PLATELET
Abs Immature Granulocytes: 0.03 10*3/uL (ref 0.00–0.07)
Basophils Absolute: 0 10*3/uL (ref 0.0–0.1)
Basophils Relative: 0 %
Eosinophils Absolute: 0.1 10*3/uL (ref 0.0–0.5)
Eosinophils Relative: 1 %
HCT: 36.4 % (ref 36.0–46.0)
Hemoglobin: 12.2 g/dL (ref 12.0–15.0)
Immature Granulocytes: 0 %
Lymphocytes Relative: 12 %
Lymphs Abs: 1.1 10*3/uL (ref 0.7–4.0)
MCH: 30 pg (ref 26.0–34.0)
MCHC: 33.5 g/dL (ref 30.0–36.0)
MCV: 89.7 fL (ref 80.0–100.0)
Monocytes Absolute: 0.8 10*3/uL (ref 0.1–1.0)
Monocytes Relative: 8 %
Neutro Abs: 7.7 10*3/uL (ref 1.7–7.7)
Neutrophils Relative %: 79 %
Platelets: 174 10*3/uL (ref 150–400)
RBC: 4.06 MIL/uL (ref 3.87–5.11)
RDW: 13.2 % (ref 11.5–15.5)
WBC: 9.6 10*3/uL (ref 4.0–10.5)
nRBC: 0 % (ref 0.0–0.2)

## 2021-08-28 MED ORDER — CHLORHEXIDINE GLUCONATE CLOTH 2 % EX PADS
6.0000 | MEDICATED_PAD | Freq: Every day | CUTANEOUS | Status: DC
  Administered 2021-08-28 – 2021-09-02 (×6): 6 via TOPICAL

## 2021-08-28 MED ORDER — ENOXAPARIN SODIUM 30 MG/0.3ML IJ SOSY
30.0000 mg | PREFILLED_SYRINGE | INTRAMUSCULAR | Status: DC
  Administered 2021-08-28 – 2021-09-02 (×5): 30 mg via SUBCUTANEOUS
  Filled 2021-08-28 (×5): qty 0.3

## 2021-08-28 NOTE — Progress Notes (Signed)
PT Cancellation Note  Patient Details Name: Katelyn Morris MRN: [id number] DOB: [birth year]   Cancelled Treatment:    Reason Eval/Treat Not Completed: Other (comment).  Awaiting instructions regarding hip approach and fracture of C1.  Revisit at another time.   CARNITA GOLOB 08/28/2021, 9:03 AM  Mee Hives, PT PhD Acute Rehab Dept. Number: [place] [facility] [place]

## 2021-08-28 NOTE — Anesthesia Postprocedure Evaluation (Signed)
Anesthesia Post Note  Patient: Katelyn Morris  Procedure(s) Performed: HEMI HIP ARTHROPLASTY ANTERIOR APPROACH (Left: Hip)     Patient location during evaluation: PACU Anesthesia Type: Spinal Level of consciousness: oriented and awake and alert Pain management: pain level controlled Vital Signs Assessment: post-procedure vital signs reviewed and stable Respiratory status: spontaneous breathing, respiratory function stable and patient connected to nasal cannula oxygen Cardiovascular status: blood pressure returned to baseline and stable Postop Assessment: no headache, no backache and no apparent nausea or vomiting Anesthetic complications: no   No notable events documented.               Effie Berkshire

## 2021-08-28 NOTE — Progress Notes (Signed)
Hydrocolloid dressing noted to be removed from the surgical site. Removed by patient. Staples are intact, this Probation officer applied a foam dressing to cover surgical site.  Fall precautions in place, call bell within reach and will continue to monitor patient

## 2021-08-28 NOTE — Progress Notes (Addendum)
PROGRESS NOTE    Katelyn Morris  QQV:[id number] DOB: 06/20/1923 DOA: 08/26/2021 PCP: Lawerance Cruel, MD   Brief Narrative:  Katelyn Morris is a 88 y.o. female with medical history significant of HTN, hypothyroidism, SSS s/p PPM placement, CKD stage 3a, HLD, PAD, anxiety presented to ED after mechanical fall while working in the kitchen. She denies hitting her head or losing consciousness. She denies any neck pain. She had immediate pain to her left leg and couldn't bear weight.    Upon arrival to ED, she was hemodynamically stable.  She was diagnosed with displaced left femoral neck fracture. CT cervical: subtle calcification along the anterior inferior aspect of the lateral mass of C1 w/out associated prevertebral soft tissue swelling in the area. Cannot exclude subtle fracture but it could be artifact as well.  Patient with no neck pain.  Admitted under [place].  Orthopedics consulted.  Assessment & Plan:   Principal Problem:   Closed left hip fracture, initial encounter [facility]) Active Problems:   Hypothyroidism   Essential hypertension   SSS (sick sinus syndrome) s/p PPM   PAD (peripheral artery disease) (HCC)   Hypercholesterolemia   Anxiety   Abnormal CT scan, cervical spine   CKD (chronic kidney disease) stage 3, GFR 30-59 ml/min (HCC)  Closed left hip fracture, initial encounter ([place])- (present on admission): S/p left hip hemiarthroplasty.  Pain controlled.  Management per orthopedics.  UTI: Continue Rocephin.   Abnormal CT scan, cervical spine- (present on admission) Questionable C1 subtle fracture. Could not rule out due to artifact -patient denies hitting head and denies neck pain -continue to monitor    Hypercholesterolemia- (present on admission) Cholesterol well controlled, continue lipitor    Essential hypertension- (present on admission): Blood pressure controlled. Continue her metoprolol (hx of PAT) , hold hctz at this time.    CKD (chronic kidney disease)  stage 3, GFR 30-59 ml/min (HCC) baseline creatinine 1.1, stable.   PAD (peripheral artery disease) ([place])- (present on admission) Continue statin and pletal.  Resume aspirin postoperatively.   SSS (sick sinus syndrome) s/p PPM- (present on admission) PPM in 2013, regular interrogation checks, followed by cardiology  Denies any palpitations/chest pain prior to fall    Hypothyroidism- (present on admission) Continue synthroid TSH wnl 01/2021   Anxiety- (present on admission) Continue low dose xanax bid   DVT prophylaxis: enoxaparin (LOVENOX) injection 30 mg Start: 08/28/21 1200 SCDs Start: 08/26/21 1851   Code Status: DNR  Family Communication:  None present at bedside.  Plan of care discussed with patient in length and he/she verbalized understanding and agreed with it.  Status is: Inpatient Remains inpatient appropriate because: Needs SNF placement.  Estimated body mass index is 21.03 kg/m as calculated from the following:   Height as of this encounter: 5\' 2"  (1.575 m).   Weight as of this encounter: 52.2 kg.    Nutritional Assessment: Body mass index is 21.03 kg/m.Marland Kitchen Seen by dietician.  I agree with the assessment and plan as outlined below: Nutrition Status: Nutrition Problem: Increased nutrient needs Etiology: hip fracture Signs/Symptoms: estimated needs Interventions: Refer to RD note for recommendations  . Skin Assessment: I have examined the patient's skin and I agree with the wound assessment as performed by the wound care RN as outlined below:    Consultants:  Orthopedics  Procedures:  Left hip hemiarthroplasty  Antimicrobials:  Anti-infectives (From admission, onward)    Start     Dose/Rate Route Frequency Ordered Stop   08/27/21 2200  ceFAZolin (  ANCEF) IVPB 2g/100 mL premix  Status:  Discontinued        2 g 200 mL/hr over 30 Minutes Intravenous Every 8 hours 08/27/21 1857 08/27/21 1924   08/27/21 1715  vancomycin (VANCOCIN) powder  Status:   Discontinued          As needed 08/27/21 1724 08/27/21 1738   08/27/21 0915  ceFAZolin (ANCEF) IVPB 2g/100 mL premix        2 g 200 mL/hr over 30 Minutes Intravenous On call to O.R. 08/27/21 0454 08/27/21 1611   08/27/21 0915  cefTRIAXone (ROCEPHIN) 1 g in sodium chloride 0.9 % 100 mL IVPB        1 g 200 mL/hr over 30 Minutes Intravenous Every 24 hours 08/27/21 0981           Subjective: Patient seen and examined.  She states that her pain is controlled.  No new complaint.  Objective: Vitals:   08/28/21 0326 08/28/21 0536 08/28/21 0728 08/28/21 1141  BP: (!) 141/52 (!) 125/49 123/64 (!) 129/48  Pulse: 62 62 63 87  Resp: 18 17 16 16   Temp: 98.4 F (36.9 C) 98.3 F (36.8 C) 99.5 F (37.5 C) (!) 100.5 F (38.1 C)  TempSrc: Oral Oral Oral Oral  SpO2: 96% 98% 98% 95%  Weight:      Height:        Intake/Output Summary (Last 24 hours) at 08/28/2021 1252 Last data filed at 08/28/2021 0900 Gross per 24 hour  Intake 960 ml  Output 250 ml  Net 710 ml    Filed Weights   08/26/21 1519  Weight: 52.2 kg    Examination:  General exam: Appears calm and comfortable  Respiratory system: Clear to auscultation. Respiratory effort normal. Cardiovascular system: S1 & S2 heard, RRR. No JVD, murmurs, rubs, gallops or clicks. No pedal edema. Gastrointestinal system: Abdomen is nondistended, soft and nontender. No organomegaly or masses felt. Normal bowel sounds heard. Central nervous system: Alert and oriented. No focal neurological deficits. Skin: No rashes, lesions or ulcers.  Psychiatry: Judgement and insight appear normal. Mood & affect appropriate.    Data Reviewed: I have personally reviewed following labs and imaging studies  CBC: Recent Labs  Lab 08/26/21 1630 08/27/21 0415 08/28/21 0253  WBC 10.3 10.5 9.6  NEUTROABS 8.8*  --  7.7  HGB 12.6 12.2 12.2  HCT 38.6 36.5 36.4  MCV 91.5 90.1 89.7  PLT 184 163 191    Basic Metabolic Panel: Recent Labs  Lab 08/26/21 1630  08/27/21 0415  NA 140 138  K 4.0 3.7  CL 105 103  CO2 26 28  GLUCOSE 105* 107*  BUN 21 18  CREATININE 0.92 0.86  CALCIUM 9.2 8.7*    GFR: Estimated Creatinine Clearance: 30.3 mL/min (by C-G formula based on SCr of 0.86 mg/dL). Liver Function Tests: Recent Labs  Lab 08/26/21 1630  AST 27  ALT 27  ALKPHOS 58  BILITOT 1.1  PROT 6.0*  ALBUMIN 3.5    No results for input(s): LIPASE, AMYLASE in the last 168 hours. No results for input(s): AMMONIA in the last 168 hours. Coagulation Profile: Recent Labs  Lab 08/26/21 1630  INR 1.1    Cardiac Enzymes: Recent Labs  Lab 08/27/21 0415  CKTOTAL 663*    BNP (last 3 results) No results for input(s): PROBNP in the last 8760 hours. HbA1C: No results for input(s): HGBA1C in the last 72 hours. CBG: No results for input(s): GLUCAP in the last 168 hours. Lipid  Profile: No results for input(s): CHOL, HDL, LDLCALC, TRIG, CHOLHDL, LDLDIRECT in the last 72 hours. Thyroid Function Tests: No results for input(s): TSH, T4TOTAL, FREET4, T3FREE, THYROIDAB in the last 72 hours. Anemia Panel: No results for input(s): VITAMINB12, FOLATE, FERRITIN, TIBC, IRON, RETICCTPCT in the last 72 hours. Sepsis Labs: No results for input(s): PROCALCITON, LATICACIDVEN in the last 168 hours.  Recent Results (from the past 240 hour(s))  Resp Panel by RT-PCR (Flu A&B, Covid) Nasopharyngeal Swab     Status: None   Collection Time: 08/26/21  3:02 AM   Specimen: Nasopharyngeal Swab; Nasopharyngeal(NP) swabs in vial transport medium  Result Value Ref Range Status   SARS Coronavirus 2 by RT PCR NEGATIVE NEGATIVE Final    Comment: (NOTE) SARS-CoV-2 target nucleic acids are NOT DETECTED.  The SARS-CoV-2 RNA is generally detectable in upper respiratory specimens during the acute phase of infection. The lowest concentration of SARS-CoV-2 viral copies this assay can detect is 138 copies/mL. A negative result does not preclude SARS-Cov-2 infection and  should not be used as the sole basis for treatment or other patient management decisions. A negative result may occur with  improper specimen collection/handling, submission of specimen other than nasopharyngeal swab, presence of viral mutation(s) within the areas targeted by this assay, and inadequate number of viral copies(<138 copies/mL). A negative result must be combined with clinical observations, patient history, and epidemiological information. The expected result is Negative.  Fact Sheet for Patients:  EntrepreneurPulse.com.au  Fact Sheet for Healthcare Providers:  IncredibleEmployment.be  This test is no t yet approved or cleared by the [place] FDA and  has been authorized for detection and/or diagnosis of SARS-CoV-2 by FDA under an Emergency Use Authorization (EUA). This EUA will remain  in effect (meaning this test can be used) for the duration of the COVID-19 declaration under Section 564(b)(1) of the Act, 21 U.S.C.section 360bbb-3(b)(1), unless the authorization is terminated  or revoked sooner.       Influenza A by PCR NEGATIVE NEGATIVE Final   Influenza B by PCR NEGATIVE NEGATIVE Final    Comment: (NOTE) The Xpert Xpress SARS-CoV-2/FLU/RSV plus assay is intended as an aid in the diagnosis of influenza from Nasopharyngeal swab specimens and should not be used as a sole basis for treatment. Nasal washings and aspirates are unacceptable for Xpert Xpress SARS-CoV-2/FLU/RSV testing.  Fact Sheet for Patients: EntrepreneurPulse.com.au  Fact Sheet for Healthcare Providers: IncredibleEmployment.be  This test is not yet approved or cleared by the [place] FDA and has been authorized for detection and/or diagnosis of SARS-CoV-2 by FDA under an Emergency Use Authorization (EUA). This EUA will remain in effect (meaning this test can be used) for the duration of the COVID-19 declaration  under Section 564(b)(1) of the Act, 21 U.S.C. section 360bbb-3(b)(1), unless the authorization is terminated or revoked.  Performed at [facility], [place] [address], [place], [place] [address]   Urine Culture     Status: Abnormal (Preliminary result)   Collection Time: 08/27/21  9:05 AM   Specimen: Urine, Clean Catch  Result Value Ref Range Status   Specimen Description URINE, CLEAN CATCH  Final   Special Requests NONE  Final   Culture (A)  Final    >=100,000 COLONIES/mL ESCHERICHIA COLI CULTURE REINCUBATED FOR BETTER GROWTH SUSCEPTIBILITIES TO FOLLOW Performed at [facility], [address] [place],  [address]    Report Status PENDING  Incomplete  Surgical PCR Screen     Status: None   Collection  Time: 08/27/21  9:12 AM   Specimen: Urine, Catheterized; Nasal Swab  Result Value Ref Range Status   MRSA, PCR NEGATIVE NEGATIVE Final   Staphylococcus aureus NEGATIVE NEGATIVE Final    Comment: (NOTE) The Xpert SA Assay (FDA approved for NASAL specimens in patients 72 years of age and older), is one component of a comprehensive surveillance program. It is not intended to diagnose infection nor to guide or monitor treatment. Performed at [facility], [place] [address] [place], [place] [address]       Radiology Studies: DG Chest 1 View  Result Date: 08/26/2021 CLINICAL DATA:  Fall, pain EXAM: CHEST  1 VIEW COMPARISON:  Radiograph 08/18/2015 FINDINGS: Cardiomediastinal silhouette is within normal limits. Unchanged dual chamber pacemaker leads. No focal airspace consolidation. No pleural effusion. No pneumothorax. Bilateral shoulder degenerative changes. No acute osseous abnormality. Thoracic spondylosis. IMPRESSION: No evidence of acute cardiopulmonary disease. Electronically Signed   By: Maurine Simmering M.D.   On: 08/26/2021 16:29   CT Head Wo Contrast  Result Date: 08/26/2021 CLINICAL DATA:  A 88 year old female presents for evaluation of pain following fall.  EXAM: CT HEAD WITHOUT CONTRAST CT CERVICAL SPINE WITHOUT CONTRAST TECHNIQUE: Multidetector CT imaging of the head and cervical spine was performed following the standard protocol without intravenous contrast. Multiplanar CT image reconstructions of the cervical spine were also generated. RADIATION DOSE REDUCTION: This exam was performed according to the departmental dose-optimization program which includes automated exposure control, adjustment of the mA and/or kV according to patient size and/or use of iterative reconstruction technique. COMPARISON:  None FINDINGS: CT HEAD FINDINGS Brain: No evidence of acute infarction, hemorrhage, hydrocephalus, extra-axial collection or mass lesion/mass effect. Signs of generalized cerebral atrophy and chronic microvascular ischemic change. Vascular: No hyperdense vessel or unexpected calcification. Skull: Normal. Negative for fracture or focal lesion. Sinuses/Orbits: Visualized paranasal sinuses and orbits without acute finding Other: None. CT CERVICAL SPINE FINDINGS Alignment: Accentuated cervical lordosis. Extensive spinal degenerative changes. Head turned to the RIGHT. No frank listhesis. Degenerative changes include disc space narrowing and facet arthropathy. Skull base and vertebrae: A subtle calcification along the inferior margin of the lateral mass anteriorly of C1. Unfortunately there is streak artifact that passes through this area as well which limits assessment. There is no associated prevertebral soft tissue swelling in the area. No primary bone lesion or focal pathologic process. Loss of height at T1 is age indeterminate with mild loss of height of the T1 superior endplate approximately 19% loss of height at this level. No associated prevertebral soft tissue swelling. Soft tissues and spinal canal: No prevertebral fluid or swelling. No visible canal hematoma. Disc levels: Signs of multilevel degenerative change of the cervical spine with facet arthropathy and disc  space narrowing with uncovertebral spurring greatest at C5-6 and C6-7. Upper chest: Biapical pleural and parenchymal scarring. Other: None IMPRESSION: 1. No acute intracranial abnormality. 2. Signs of generalized cerebral atrophy and chronic microvascular ischemic change. 3. A subtle calcification along the anterior inferior aspect of the lateral mass of C1 (image 23/7) without associated prevertebral soft tissue swelling in the area. Difficult to exclude a subtle fracture in this location degenerative changes possible and unfortunately there is streak artifact in the area from dental hardware. 4. T1 superior endplate the fraction approximately 10% loss of height favors chronic injury in the absence of prevertebral soft tissue swelling. Correlate with any pain in this area. 5. Extensive spinal degenerative changes of the cervical spine with facet arthropathy and disc space narrowing  greatest at C5-6 and C6-7. Electronically Signed   By: Zetta Bills M.D.   On: 08/26/2021 18:02   CT Cervical Spine Wo Contrast  Result Date: 08/26/2021 CLINICAL DATA:  A 88 year old female presents for evaluation of pain following fall. EXAM: CT HEAD WITHOUT CONTRAST CT CERVICAL SPINE WITHOUT CONTRAST TECHNIQUE: Multidetector CT imaging of the head and cervical spine was performed following the standard protocol without intravenous contrast. Multiplanar CT image reconstructions of the cervical spine were also generated. RADIATION DOSE REDUCTION: This exam was performed according to the departmental dose-optimization program which includes automated exposure control, adjustment of the mA and/or kV according to patient size and/or use of iterative reconstruction technique. COMPARISON:  None FINDINGS: CT HEAD FINDINGS Brain: No evidence of acute infarction, hemorrhage, hydrocephalus, extra-axial collection or mass lesion/mass effect. Signs of generalized cerebral atrophy and chronic microvascular ischemic change. Vascular: No  hyperdense vessel or unexpected calcification. Skull: Normal. Negative for fracture or focal lesion. Sinuses/Orbits: Visualized paranasal sinuses and orbits without acute finding Other: None. CT CERVICAL SPINE FINDINGS Alignment: Accentuated cervical lordosis. Extensive spinal degenerative changes. Head turned to the RIGHT. No frank listhesis. Degenerative changes include disc space narrowing and facet arthropathy. Skull base and vertebrae: A subtle calcification along the inferior margin of the lateral mass anteriorly of C1. Unfortunately there is streak artifact that passes through this area as well which limits assessment. There is no associated prevertebral soft tissue swelling in the area. No primary bone lesion or focal pathologic process. Loss of height at T1 is age indeterminate with mild loss of height of the T1 superior endplate approximately 75% loss of height at this level. No associated prevertebral soft tissue swelling. Soft tissues and spinal canal: No prevertebral fluid or swelling. No visible canal hematoma. Disc levels: Signs of multilevel degenerative change of the cervical spine with facet arthropathy and disc space narrowing with uncovertebral spurring greatest at C5-6 and C6-7. Upper chest: Biapical pleural and parenchymal scarring. Other: None IMPRESSION: 1. No acute intracranial abnormality. 2. Signs of generalized cerebral atrophy and chronic microvascular ischemic change. 3. A subtle calcification along the anterior inferior aspect of the lateral mass of C1 (image 23/7) without associated prevertebral soft tissue swelling in the area. Difficult to exclude a subtle fracture in this location degenerative changes possible and unfortunately there is streak artifact in the area from dental hardware. 4. T1 superior endplate the fraction approximately 10% loss of height favors chronic injury in the absence of prevertebral soft tissue swelling. Correlate with any pain in this area. 5. Extensive  spinal degenerative changes of the cervical spine with facet arthropathy and disc space narrowing greatest at C5-6 and C6-7. Electronically Signed   By: Zetta Bills M.D.   On: 08/26/2021 18:02   DG C-Arm 1-60 Min-No Report  Result Date: 08/27/2021 Fluoroscopy was utilized by the requesting physician.  No radiographic interpretation.   DG HIP UNILAT WITH PELVIS 2-3 VIEWS LEFT  Result Date: 08/27/2021 CLINICAL DATA:  Fracture left hip EXAM: DG HIP (WITH OR WITHOUT PELVIS) 2-3V LEFT COMPARISON:  08/26/2021 FINDINGS: There is interval left hip arthroplasty. No fracture is seen. Fluoroscopic time was 24 seconds. Radiation dose is 1.4 mGy. IMPRESSION: Fluoroscopic assistance was provided for left hip arthroplasty. Electronically Signed   By: Elmer Picker M.D.   On: 08/27/2021 18:51   DG Hip Unilat W or Wo Pelvis 2-3 Views Left  Result Date: 08/26/2021 CLINICAL DATA:  Concern for fracture EXAM: DG HIP (WITH OR WITHOUT PELVIS) 2-3V LEFT COMPARISON:  None. FINDINGS: There is a displaced left femoral neck fracture with varus angulation. Mild bilateral hip osteoarthritis. IMPRESSION: Displaced left femoral neck fracture with varus angulation. Electronically Signed   By: Maurine Simmering M.D.   On: 08/26/2021 16:27    Scheduled Meds:  atorvastatin  10 mg Oral Daily   Chlorhexidine Gluconate Cloth  6 each Topical Daily   cilostazol  50 mg Oral BID   enoxaparin (LOVENOX) injection  30 mg Subcutaneous Q24H   levothyroxine  50 mcg Oral Q0600   metoprolol succinate  37.5 mg Oral Daily   Continuous Infusions:  cefTRIAXone (ROCEPHIN)  IV 1 g (08/28/21 1050)   methocarbamol (ROBAXIN) IV Stopped (08/27/21 0506)     LOS: 2 days   Time spent: 26 minutes  Darliss Cheney, MD [facility]  08/28/2021, 12:52 PM  Please page via [facility] and do not message via secure chat for urgent patient care matters. Secure chat can be used for non urgent patient care matters.  How to contact the [facility] Attending or  Consulting provider [place] or covering provider during after hours [place], for this patient?  Check the care team in [facility] and look for a) attending/consulting [facility] provider listed and b) the [facility] team listed. Page or secure chat [phone]. Log into [email or web address] and use [place]'s universal password to access. If you do not have the password, please contact the hospital operator. Locate the [facility] provider you are looking for under [facility] and page to a number that you can be directly reached. If you still have difficulty reaching the provider, please page the [facility] (Director on Call) for the Hospitalists listed on amion for assistance.

## 2021-08-28 NOTE — Progress Notes (Signed)
° °  Subjective:  Patient reports pain as mild.Well controlled with pain medication. Pending PT eval and mobility today.  Objective:   VITALS:   Vitals:   08/28/21 0039 08/28/21 0326 08/28/21 0536 08/28/21 0728  BP: (!) 125/53 (!) 141/52 (!) 125/49 123/64  Pulse: (!) 59 62 62 63  Resp: 18 18 17 16   Temp: 98.2 F (36.8 C) 98.4 F (36.9 C) 98.3 F (36.8 C) 99.5 F (37.5 C)  TempSrc:  Oral Oral Oral  SpO2: 95% 96% 98% 98%  Weight:      Height:       Dressing is CDI Neurologically intact Neurovascular intact Sensation intact distally Intact pulses distally Fires TA/EHL  Lab Results  Component Value Date   WBC 9.6 08/28/2021   HGB 12.2 08/28/2021   HCT 36.4 08/28/2021   MCV 89.7 08/28/2021   PLT 174 08/28/2021     Assessment/Plan:  1 Day Post-Op sp left hip hemiarthroplasty  - Expected postop acute blood loss anemia - will monitor for symptoms - Patient to work with PT/OT to optimize mobilization safely - DVT ppx - SCDs, ambulation, Lovenox daily - Postoperative Abx: Ancef x 2 additional doses given - WBAT operative extremity - Direct anterior precautions - Pain control - multimodal pain management, ATC acetaminophen in conjunction with as needed narcotic (oxycodone), although this should be minimized with other modalities  - Discharge planning pending CM, appreciate coordination   Zury Fazzino 08/28/2021, 9:40 AM

## 2021-08-28 NOTE — Transfer of Care (Signed)
Immediate Anesthesia Transfer of Care Note  Patient: Katelyn Morris  Procedure(s) Performed: HEMI HIP ARTHROPLASTY ANTERIOR APPROACH (Left: Hip)  Patient Location: PACU  Anesthesia Type:MAC and Spinal  Level of Consciousness: drowsy  Airway & Oxygen Therapy: Patient Spontanous Breathing and Patient connected to face mask oxygen  Post-op Assessment: Report given to RN and Post -op Vital signs reviewed and stable  Post vital signs: Reviewed and stable  Last Vitals:  Vitals Value Taken Time  BP 123/64 08/28/21 0728  Temp 37.5 C 08/28/21 0728  Pulse 63 08/28/21 0728  Resp 16 08/28/21 0728  SpO2 98 % 08/28/21 0728    Last Pain:  Vitals:   08/28/21 0728  TempSrc: Oral  PainSc:       Patients Stated Pain Goal: 3 (25/27/12 9290)  Complications: No notable events documented.

## 2021-08-28 NOTE — Progress Notes (Signed)
Pt was seen for progression from her bed to chair with clearance from MD to move neck without a collar and with MD reviewing MRI for findings.  Pt is in chair with family present for support and discussion of home with pt having had mult falls that pt may not have shared.  Follow up for pt care focusing on standing balance, control of L hip strength and recovery of gait on RW with pt being recommended to go to SNF by PT if her progression does not lead to independent gait.  Pt has been home alone with good PLOF.  08/28/21 1000  PT Visit Information  Last PT Received On 08/28/21  Assistance Needed +1  History of Present Illness 88 yo female wtih onset of fall at home was admitted on 2/2 for L hip hemiarthroplasty for femoral neck fracture.  Pt is referred to PT to assess her ability to move and go home vs rehab.  PMHx:  heart murmur, hypothyroidism, pacer for SSS, bradycardia, HTN, hypothyroidism  Precautions  Precautions Fall;Cervical  Precaution Booklet Issued No  Precaution Comments no formal precautions but protective for C1 change  Restrictions  Weight Bearing Restrictions Yes  LLE Weight Bearing WBAT  Home Living  Family/patient expects to be discharged to: Skilled nursing facility  Additional Comments son and daughter in law were in to see pt  Prior Function  Prior Level of Function  Independent/Modified Independent;History of Falls (last six months)  Mobility Comments was on RW at home and has life alert button  Communication  Communication HOH  Pain Assessment  Pain Assessment Faces  Faces Pain Scale 6  Pain Location L hip and R hip is stiff  Pain Descriptors / Indicators Operative site guarding;Grimacing;Aching  Pain Intervention(s) Limited activity within patient's tolerance;Monitored during session;Premedicated before session;Repositioned  Cognition  Arousal/Alertness Awake/alert  Behavior During Therapy WFL for tasks assessed/performed  Overall Cognitive Status Within  Functional Limits for tasks assessed  General Comments family in but did not see her move to chair  Upper Extremity Assessment  Upper Extremity Assessment Overall WFL for tasks assessed  Lower Extremity Assessment  Lower Extremity Assessment LLE deficits/detail  LLE Deficits / Details general pain and weakness from her surgery  LLE Unable to fully assess due to pain  LLE Sensation WNL  LLE Coordination decreased gross motor  Cervical / Trunk Assessment  Cervical / Trunk Assessment Kyphotic  Bed Mobility  Overal bed mobility Needs Assistance  General bed mobility comments mod assist to sit up on side of bed  Transfers  Overall transfer level Needs assistance  Equipment used Rolling walker (2 wheels);1 person hand held assist  Transfers Sit to/from Stand  Sit to Stand Mod assist  General transfer comment mod to power up and steadied well  Ambulation/Gait  Ambulation/Gait assistance Min assist  Gait Distance (Feet) 5 Feet  Assistive device Rolling walker (2 wheels);1 person hand held assist  Gait Pattern/deviations Step-to pattern;Decreased stride length;Decreased weight shift to left;Wide base of support  General Gait Details pt limits her steps in length but is willing to wb on LLE with RW by managing her discomfort and concerns this way  Gait velocity reduced  Gait velocity interpretation <1.31 ft/sec, indicative of household ambulator  Pre-gait activities stanidng balance training and sidesteps  Balance  Overall balance assessment Needs assistance  Sitting-balance support Feet supported  Sitting balance-Leahy Scale Poor  Sitting balance - Comments tends to let herself lean back without seeming to do this voluntarily  Standing balance  support Bilateral upper extremity supported;During functional activity  Standing balance-Leahy Scale Poor  General Comments  General comments (skin integrity, edema, etc.) pt is somewhat inattentive of her postual shifts and distracted by her fear  that it's too soon to stand on L hip.  Was able to encourage her to get to chair, but is also sliding down the chair  PT - End of Session  Equipment Utilized During Treatment Gait belt  Activity Tolerance Patient limited by fatigue;Treatment limited secondary to medical complications (Comment);Patient limited by pain  Patient left in chair;with call bell/phone within reach;with chair alarm set;with family/visitor present  Nurse Communication Mobility status  PT Assessment  PT Recommendation/Assessment Patient needs continued PT services  PT Visit Diagnosis Unsteadiness on feet (R26.81);Muscle weakness (generalized) (M62.81);Pain;Difficulty in walking, not elsewhere classified (R26.2)  Pain - Right/Left Left  Pain - part of body Hip  PT Problem List Decreased strength;Decreased range of motion;Decreased activity tolerance;Decreased balance;Decreased mobility;Decreased coordination;Decreased cognition;Decreased skin integrity;Pain  PT Plan  PT Frequency (ACUTE ONLY) 7X/week  PT Treatment/Interventions (ACUTE ONLY) DME instruction;Gait training;Functional mobility training;Therapeutic activities;Therapeutic exercise;Balance training;Neuromuscular re-education;Patient/family education  AM-PAC PT "6 Clicks" Mobility Outcome Measure (Version 2)  Help needed turning from your back to your side while in a flat bed without using bedrails? 2  Help needed moving from lying on your back to sitting on the side of a flat bed without using bedrails? 2  Help needed moving to and from a bed to a chair (including a wheelchair)? 2  Help needed standing up from a chair using your arms (e.g., wheelchair or bedside chair)? 2  Help needed to walk in hospital room? 2  Help needed climbing 3-5 steps with a railing?  1  6 Click Score 11  Consider Recommendation of Discharge To: CIR/SNF/LTACH  Progressive Mobility  What is the highest level of mobility based on the progressive mobility assessment? Level 4 (Walks with  assist in room) - Balance while marching in place and cannot step forward and back - Complete  Activity Ambulated with assistance in room  PT Recommendation  Follow Up Recommendations Follow physician's recommendations for discharge plan and follow up therapies  Assistance recommended at discharge Frequent or constant Supervision/Assistance  Patient can return home with the following A lot of help with walking and/or transfers;A lot of help with bathing/dressing/bathroom;Assistance with cooking/housework;Help with stairs or ramp for entrance;Assist for transportation  Functional Status Assessment Patient has had a recent decline in their functional status and demonstrates the ability to make significant improvements in function in a reasonable and predictable amount of time.  PT equipment None recommended by PT  Individuals Consulted  Consulted and Agree with Results and Recommendations Patient  Acute Rehab PT Goals  Patient Stated Goal to get home as soon as possible  PT Goal Formulation With patient  Time For Goal Achievement 09/11/21  Potential to Achieve Goals Good  PT Time Calculation  PT Start Time (ACUTE ONLY) 1016  PT Stop Time (ACUTE ONLY) 1105  PT Time Calculation (min) (ACUTE ONLY) 49 min  PT General Charges  $$ ACUTE PT VISIT 1 Visit  PT Evaluation  $PT Eval Moderate Complexity 1 Mod  PT Treatments  $Gait Training 8-22 mins  $Therapeutic Activity 8-22 mins  Written Expression  Dominant Hand Right     Mee Hives, PT PhD Acute Rehab Dept. Number: [place] and [place]

## 2021-08-28 NOTE — Progress Notes (Addendum)
Patient ID: Katelyn Morris, female   DOB: 10/06/1922, 88 y.o.   MRN: [id number]   Subjective: 1 Day Post-Op Procedure(s) (LRB): HEMI HIP ARTHROPLASTY ANTERIOR APPROACH (Left) Patient reports pain as mild.  OOB to recliner with PT.   Objective: Vital signs in last 24 hours: Temp:  [97.4 F (36.3 C)-99.5 F (37.5 C)] 99.5 F (37.5 C) (02/04 0728) Pulse Rate:  [59-63] 63 (02/04 0728) Resp:  [13-19] 16 (02/04 0728) BP: (107-153)/(49-90) 123/64 (02/04 0728) SpO2:  [92 %-100 %] 98 % (02/04 0728)  Intake/Output from previous day: 02/03 0701 - 02/04 0700 In: 720 [P.O.:120; I.V.:400; IV Piggyback:200] Out: 700 [Urine:700] Intake/Output this shift: Total I/O In: 240 [P.O.:240] Out: -   Recent Labs    08/26/21 1630 08/27/21 0415 08/28/21 0253  HGB 12.6 12.2 12.2   Recent Labs    08/27/21 0415 08/28/21 0253  WBC 10.5 9.6  RBC 4.05 4.06  HCT 36.5 36.4  PLT 163 174   Recent Labs    08/26/21 1630 08/27/21 0415  NA 140 138  K 4.0 3.7  CL 105 103  CO2 26 28  BUN 21 18  CREATININE 0.92 0.86  GLUCOSE 105* 107*  CALCIUM 9.2 8.7*   Recent Labs    08/26/21 1630  INR 1.1    Neurologically intact DG C-Arm 1-60 Min-No Report  Result Date: 08/27/2021 Fluoroscopy was utilized by the requesting physician.  No radiographic interpretation.   DG HIP UNILAT WITH PELVIS 2-3 VIEWS LEFT  Result Date: 08/27/2021 CLINICAL DATA:  Fracture left hip EXAM: DG HIP (WITH OR WITHOUT PELVIS) 2-3V LEFT COMPARISON:  08/26/2021 FINDINGS: There is interval left hip arthroplasty. No fracture is seen. Fluoroscopic time was 24 seconds. Radiation dose is 1.4 mGy. IMPRESSION: Fluoroscopic assistance was provided for left hip arthroplasty. Electronically Signed   By: Elmer Picker M.D.   On: 08/27/2021 18:51    Assessment/Plan: 1 Day Post-Op Procedure(s) (LRB): HEMI HIP ARTHROPLASTY ANTERIOR APPROACH (Left) Plan: lives alone, had been falling at home. Has son and DIL that checks on her.  CT  cervical spine Neg for acute Fx. Continue PT .  Katelyn Morris 08/28/2021, 10:58 AM

## 2021-08-29 LAB — URINE CULTURE: Culture: >=100000 — AB

## 2021-08-29 MED ORDER — GLYCERIN (LAXATIVE) 2.1 G RE SUPP
1.0000 | RECTAL | Status: DC | PRN
  Filled 2021-08-29: qty 1

## 2021-08-29 MED ORDER — ENSURE ENLIVE PO LIQD
237.0000 mL | Freq: Two times a day (BID) | ORAL | Status: DC
  Administered 2021-08-30 – 2021-09-02 (×6): 237 mL via ORAL

## 2021-08-29 NOTE — Progress Notes (Signed)
PROGRESS NOTE    [name]  RSW:[id number] DOB: 1923-02-19 DOA: 08/26/2021 PCP: Katelyn Cruel, MD   Brief Narrative:  Katelyn Morris is a 88 y.o. female with medical history significant of HTN, hypothyroidism, SSS s/p PPM placement, CKD stage 3a, HLD, PAD, anxiety presented to ED after mechanical fall while working in the kitchen. She denies hitting her head or losing consciousness. She denies any neck pain. She had immediate pain to her left leg and couldn't bear weight.    Upon arrival to ED, she was hemodynamically stable.  She was diagnosed with displaced left femoral neck fracture. CT cervical: subtle calcification along the anterior inferior aspect of the lateral mass of C1 w/out associated prevertebral soft tissue swelling in the area. Cannot exclude subtle fracture but it could be artifact as well.  Patient with no neck pain.  Admitted under Katelyn Morris.  Orthopedics consulted.  Assessment & Plan:   Principal Problem:   Closed left hip fracture, initial encounter [facility]) Active Problems:   Hypothyroidism   Essential hypertension   SSS (sick sinus syndrome) s/p PPM   PAD (peripheral artery disease) (HCC)   Hypercholesterolemia   Anxiety   Abnormal CT scan, cervical spine   CKD (chronic kidney disease) stage 3, GFR 30-59 ml/min (HCC)  Closed left hip fracture, initial encounter (Katelyn Morris)- (present on admission): S/p left hip hemiarthroplasty.  Pain controlled.  Management per orthopedics.  UTI: Continue Rocephin, complete course after today's dose.  Growing E. coli in the urine culture which is pansensitive.   Abnormal CT scan, cervical spine- (present on admission) Questionable C1 subtle fracture. Could not rule out due to artifact -patient denies hitting head and denies neck pain -continue to monitor    Hypercholesterolemia- (present on admission) Cholesterol well controlled, continue lipitor    Essential hypertension- (present on admission): Blood pressure  controlled. Continue her metoprolol (hx of PAT) , hold hctz at this time.    CKD (chronic kidney disease) stage 3, GFR 30-59 ml/min (HCC) baseline creatinine 1.1, stable.   PAD (peripheral artery disease) ([place])- (present on admission) Continue statin and pletal.  Resume aspirin postoperatively.   SSS (sick sinus syndrome) s/p PPM- (present on admission) PPM in 2013, regular interrogation checks, followed by cardiology  Denies any palpitations/chest pain prior to fall    Hypothyroidism- (present on admission) Continue synthroid TSH wnl 01/2021   Anxiety- (present on admission) Continue low dose xanax bid   DVT prophylaxis: enoxaparin (LOVENOX) injection 30 mg Start: 08/28/21 1200 SCDs Start: 08/26/21 1851   Code Status: DNR  Family Communication:  None present at bedside.  Plan of care discussed with patient in length and he/she verbalized understanding and agreed with it.  Status is: Inpatient Remains inpatient appropriate because: Needs SNF placement.  Estimated body mass index is 21.03 kg/m as calculated from the following:   Height as of this encounter: 5\' 2"  (1.575 m).   Weight as of this encounter: 52.2 kg.    Nutritional Assessment: Body mass index is 21.03 kg/m.Marland Kitchen Seen by dietician.  I agree with the assessment and plan as outlined below: Nutrition Status: Nutrition Problem: Increased nutrient needs Etiology: hip fracture Signs/Symptoms: estimated needs Interventions: Refer to RD note for recommendations  . Skin Assessment: I have examined the patient's skin and I agree with the wound assessment as performed by the wound care RN as outlined below:    Consultants:  Orthopedics  Procedures:  Left hip hemiarthroplasty  Antimicrobials:  Anti-infectives (From admission, onward)  Start     Dose/Rate Route Frequency Ordered Stop   08/27/21 2200  ceFAZolin (ANCEF) IVPB 2g/100 mL premix  Status:  Discontinued        2 g 200 mL/hr over 30 Minutes Intravenous  Every 8 hours 08/27/21 1857 08/27/21 1924   08/27/21 1715  vancomycin (VANCOCIN) powder  Status:  Discontinued          As needed 08/27/21 1724 08/27/21 1738   08/27/21 0915  ceFAZolin (ANCEF) IVPB 2g/100 mL premix        2 g 200 mL/hr over 30 Minutes Intravenous On call to O.R. 08/27/21 3299 08/27/21 1611   08/27/21 0915  cefTRIAXone (ROCEPHIN) 1 g in sodium chloride 0.9 % 100 mL IVPB        1 g 200 mL/hr over 30 Minutes Intravenous Every 24 hours 08/27/21 0822 08/29/21 0942         Subjective: Seen and examined.  She has no complaints.  No neck pain. Objective: Vitals:   08/28/21 1709 08/28/21 2128 08/29/21 0428 08/29/21 0745  BP: (!) 122/58 (!) 121/53 (!) 123/51 110/80  Pulse: 71 (!) 58 65 65  Resp: 16 18 16 16   Temp: 100 F (37.8 C) 99.6 F (37.6 C) 99.3 F (37.4 C) 98.9 F (37.2 C)  TempSrc: Oral Oral Oral Oral  SpO2: 93% 96% 94% 93%  Weight:      Height:        Intake/Output Summary (Last 24 hours) at 08/29/2021 1040 Last data filed at 08/29/2021 2426 Gross per 24 hour  Intake 240 ml  Output 300 ml  Net -60 ml    Filed Weights   08/26/21 1519  Weight: 52.2 kg    Examination:  General exam: Appears calm and comfortable  Respiratory system: Clear to auscultation. Respiratory effort normal. Cardiovascular system: S1 & S2 heard, RRR. No JVD, murmurs, rubs, gallops or clicks. No pedal edema. Gastrointestinal system: Abdomen is nondistended, soft and nontender. No organomegaly or masses felt. Normal bowel sounds heard. Central nervous system: Alert and oriented. No focal neurological deficits. Skin: No rashes, lesions or ulcers.  Psychiatry: Judgement and insight appear normal. Mood & affect appropriate.    Data Reviewed: I have personally reviewed following labs and imaging studies  CBC: Recent Labs  Lab 08/26/21 1630 08/27/21 0415 08/28/21 0253  WBC 10.3 10.5 9.6  NEUTROABS 8.8*  --  7.7  HGB 12.6 12.2 12.2  HCT 38.6 36.5 36.4  MCV 91.5 90.1 89.7   PLT 184 163 834    Basic Metabolic Panel: Recent Labs  Lab 08/26/21 1630 08/27/21 0415  NA 140 138  K 4.0 3.7  CL 105 103  CO2 26 28  GLUCOSE 105* 107*  BUN 21 18  CREATININE 0.92 0.86  CALCIUM 9.2 8.7*    GFR: Estimated Creatinine Clearance: 30.3 mL/min (by C-G formula based on SCr of 0.86 mg/dL). Liver Function Tests: Recent Labs  Lab 08/26/21 1630  AST 27  ALT 27  ALKPHOS 58  BILITOT 1.1  PROT 6.0*  ALBUMIN 3.5    No results for input(s): LIPASE, AMYLASE in the last 168 hours. No results for input(s): AMMONIA in the last 168 hours. Coagulation Profile: Recent Labs  Lab 08/26/21 1630  INR 1.1    Cardiac Enzymes: Recent Labs  Lab 08/27/21 0415  CKTOTAL 663*    BNP (last 3 results) No results for input(s): PROBNP in the last 8760 hours. HbA1C: No results for input(s): HGBA1C in the last 72 hours. CBG:  No results for input(s): GLUCAP in the last 168 hours. Lipid Profile: No results for input(s): CHOL, HDL, LDLCALC, TRIG, CHOLHDL, LDLDIRECT in the last 72 hours. Thyroid Function Tests: No results for input(s): TSH, T4TOTAL, FREET4, T3FREE, THYROIDAB in the last 72 hours. Anemia Panel: No results for input(s): VITAMINB12, FOLATE, FERRITIN, TIBC, IRON, RETICCTPCT in the last 72 hours. Sepsis Labs: No results for input(s): PROCALCITON, LATICACIDVEN in the last 168 hours.  Recent Results (from the past 240 hour(s))  Resp Panel by RT-PCR (Flu A&B, Covid) Nasopharyngeal Swab     Status: None   Collection Time: 08/26/21  3:02 AM   Specimen: Nasopharyngeal Swab; Nasopharyngeal(NP) swabs in vial transport medium  Result Value Ref Range Status   SARS Coronavirus 2 by RT PCR NEGATIVE NEGATIVE Final    Comment: (NOTE) SARS-CoV-2 target nucleic acids are NOT DETECTED.  The SARS-CoV-2 RNA is generally detectable in upper respiratory specimens during the acute phase of infection. The lowest concentration of SARS-CoV-2 viral copies this assay can detect  is 138 copies/mL. A negative result does not preclude SARS-Cov-2 infection and should not be used as the sole basis for treatment or other patient management decisions. A negative result may occur with  improper specimen collection/handling, submission of specimen other than nasopharyngeal swab, presence of viral mutation(s) within the areas targeted by this assay, and inadequate number of viral copies(<138 copies/mL). A negative result must be combined with clinical observations, patient history, and epidemiological information. The expected result is Negative.  Fact Sheet for Patients:  EntrepreneurPulse.com.au  Fact Sheet for Healthcare Providers:  IncredibleEmployment.be  This test is no t yet approved or cleared by the [place] FDA and  has been authorized for detection and/or diagnosis of SARS-CoV-2 by FDA under an Emergency Use Authorization (EUA). This EUA will remain  in effect (meaning this test can be used) for the duration of the COVID-19 declaration under Section 564(b)(1) of the Act, 21 U.S.C.section 360bbb-3(b)(1), unless the authorization is terminated  or revoked sooner.       Influenza A by PCR NEGATIVE NEGATIVE Final   Influenza B by PCR NEGATIVE NEGATIVE Final    Comment: (NOTE) The Xpert Xpress SARS-CoV-2/FLU/RSV plus assay is intended as an aid in the diagnosis of influenza from Nasopharyngeal swab specimens and should not be used as a sole basis for treatment. Nasal washings and aspirates are unacceptable for Xpert Xpress SARS-CoV-2/FLU/RSV testing.  Fact Sheet for Patients: EntrepreneurPulse.com.au  Fact Sheet for Healthcare Providers: IncredibleEmployment.be  This test is not yet approved or cleared by the [place] FDA and has been authorized for detection and/or diagnosis of SARS-CoV-2 by FDA under an Emergency Use Authorization (EUA). This EUA will remain in effect  (meaning this test can be used) for the duration of the COVID-19 declaration under Section 564(b)(1) of the Act, 21 U.S.C. section 360bbb-3(b)(1), unless the authorization is terminated or revoked.  Performed at [facility], [place] [address] [place], [place] [address]   Urine Culture     Status: Abnormal   Collection Time: 08/27/21  9:05 AM   Specimen: Urine, Clean Catch  Result Value Ref Range Status   Specimen Description URINE, CLEAN CATCH  Final   Special Requests   Final    NONE Performed at [facility], [place] [address], [place], [place] [address]    Culture >=100,000 COLONIES/mL ESCHERICHIA COLI (A)  Final   Report Status 08/29/2021 FINAL  Final   Organism ID, Bacteria ESCHERICHIA COLI (A)  Final  Susceptibility   Escherichia coli - MIC*    AMPICILLIN <=2 SENSITIVE Sensitive     CEFAZOLIN <=4 SENSITIVE Sensitive     CEFEPIME <=0.12 SENSITIVE Sensitive     CEFTRIAXONE <=0.25 SENSITIVE Sensitive     CIPROFLOXACIN <=0.25 SENSITIVE Sensitive     GENTAMICIN <=1 SENSITIVE Sensitive     IMIPENEM <=0.25 SENSITIVE Sensitive     NITROFURANTOIN <=16 SENSITIVE Sensitive     TRIMETH/SULFA <=20 SENSITIVE Sensitive     AMPICILLIN/SULBACTAM <=2 SENSITIVE Sensitive     PIP/TAZO <=4 SENSITIVE Sensitive     * >=100,000 COLONIES/mL ESCHERICHIA COLI  Surgical PCR Screen     Status: None   Collection Time: 08/27/21  9:12 AM   Specimen: Urine, Catheterized; Nasal Swab  Result Value Ref Range Status   MRSA, PCR NEGATIVE NEGATIVE Final   Staphylococcus aureus NEGATIVE NEGATIVE Final    Comment: (NOTE) The Xpert SA Assay (FDA approved for NASAL specimens in patients 56 years of age and older), is one component of a comprehensive surveillance program. It is not intended to diagnose infection nor to guide or monitor treatment. Performed at [facility], [place] [address], [place], [place] [address]       Radiology Studies: DG C-Arm 1-60 Min-No Report  Result Date:  08/27/2021 Fluoroscopy was utilized by the requesting physician.  No radiographic interpretation.   DG HIP UNILAT WITH PELVIS 2-3 VIEWS LEFT  Result Date: 08/27/2021 CLINICAL DATA:  Fracture left hip EXAM: DG HIP (WITH OR WITHOUT PELVIS) 2-3V LEFT COMPARISON:  08/26/2021 FINDINGS: There is interval left hip arthroplasty. No fracture is seen. Fluoroscopic time was 24 seconds. Radiation dose is 1.4 mGy. IMPRESSION: Fluoroscopic assistance was provided for left hip arthroplasty. Electronically Signed   By: Elmer Picker M.D.   On: 08/27/2021 18:51    Scheduled Meds:  atorvastatin  10 mg Oral Daily   Chlorhexidine Gluconate Cloth  6 each Topical Daily   cilostazol  50 mg Oral BID   enoxaparin (LOVENOX) injection  30 mg Subcutaneous Q24H   levothyroxine  50 mcg Oral Q0600   metoprolol succinate  37.5 mg Oral Daily   Continuous Infusions:  methocarbamol (ROBAXIN) IV Stopped (08/27/21 0506)     LOS: 3 days   Time spent: 25 minutes  Darliss Cheney, MD [facility]  08/29/2021, 10:40 AM  Please page via [place] [place] and do not message via secure chat for urgent patient care matters. Secure chat can be used for non urgent patient care matters.  How to contact the [facility] Attending or Consulting provider [place] or covering provider during after hours [place], for this patient?  Check the care team in [facility] and look for a) attending/consulting [facility] provider listed and b) the [facility] team listed. Page or secure chat [phone]. Log into [email or web address] and use [place]'s universal password to access. If you do not have the password, please contact the hospital operator. Locate the [facility] provider you are looking for under [facility] and page to a number that you can be directly reached. If you still have difficulty reaching the provider, please page the [facility] (Director on Call) for the Hospitalists listed on amion for assistance.

## 2021-08-29 NOTE — Progress Notes (Signed)
Patient ID: Katelyn Morris, female   DOB: February 21, 1923, 88 y.o.   MRN: [id number]  Post-Op Day 2 : Foley catheter intact and draining clear yellow urine.. MD paged. Instructed to remove.  Haydee Salter, RN

## 2021-08-29 NOTE — Progress Notes (Signed)
Physical Therapy Treatment Patient Details Name: Katelyn Morris MRN: [id number] DOB: 28-Apr-1923 Today's Date: 08/29/2021   History of Present Illness 88 yo female wtih onset of fall at home was admitted on 08/26/21 for L hip hemiarthroplasty for femoral neck fracture.  Pt is referred to PT to assess her ability to move and go home vs rehab.  PMHx:  heart murmur, hypothyroidism, pacer for SSS, bradycardia, HTN, hypothyroidism    PT Comments    Patient limited by pain and severe HOH. Patient requires maxA for bed mobility and modA for basic transfers. Patient not following commands consistently due to [facility] even with max gestural cues and loud voice. Patient also limited by fear of pain in L LE with weightbearing. Continue to recommend SNF for ongoing Physical Therapy.        Recommendations for follow up therapy are one component of a multi-disciplinary discharge planning process, led by the attending physician.  Recommendations may be updated based on patient status, additional functional criteria and insurance authorization.  Follow Up Recommendations  Skilled nursing-short term rehab (<3 hours/day)     Assistance Recommended at Discharge Frequent or constant Supervision/Assistance  Patient can return home with the following A lot of help with walking and/or transfers;A lot of help with bathing/dressing/bathroom;Assistance with cooking/housework;Help with stairs or ramp for entrance;Assist for transportation   Equipment Recommendations  Rolling [employer] (2 wheels)    Recommendations for Other Services       Precautions / Restrictions Precautions Precautions: Fall Precaution Booklet Issued: No Precaution Comments: no formal precautions but protective for C1 change Restrictions Weight Bearing Restrictions: Yes LLE Weight Bearing: Weight bearing as tolerated     Mobility  Bed Mobility Overal bed mobility: Needs Assistance Bed Mobility: Supine to Sit     Supine to sit: Max  assist     General bed mobility comments: maxA to bring bilateral LEs to EOB and trunk elevation    Transfers Overall transfer level: Needs assistance Equipment used: Rolling [employer]don (2 wheels) Transfers: Sit to/from Stand, Bed to chair/wheelchair/BSC Sit to Stand: Mod assist   Step pivot transfers: Mod assist       General transfer comment: modA to stand from EOB with cues for hand placement. ModA to step towards recliner with assist for balance and RW management. Max cues to reach for chair but patient unable to hear cues despite gestures and loud voice    Ambulation/Gait                   Stairs             Wheelchair Mobility    Modified Rankin (Stroke Patients Only)       Balance Overall balance assessment: Needs assistance Sitting-balance support: Feet supported Sitting balance-Leahy Scale: Fair     Standing balance support: Bilateral upper extremity supported, Reliant on assistive device for balance Standing balance-Leahy Scale: Poor Standing balance comment: reliant on UE support                            Cognition Arousal/Alertness: Awake/alert Behavior During Therapy: WFL for tasks assessed/performed Overall Cognitive Status: Difficult to assess                                 General Comments: severely HOH and difficult to assess cognition        Exercises  General Comments        Pertinent Vitals/Pain Pain Assessment Pain Assessment: Faces Faces Pain Scale: Hurts even more Pain Location: L hip Pain Descriptors / Indicators: Operative site guarding, Grimacing, Aching Pain Intervention(s): Monitored during session, Repositioned    Home Living                          Prior Function            PT Goals (current goals can now be found in the care plan section) Acute Rehab PT Goals Patient Stated Goal: to get home as soon as possible PT Goal Formulation: With patient Time For Goal  Achievement: 09/11/21 Potential to Achieve Goals: Good Progress towards PT goals: Progressing toward goals    Frequency    Min 3X/week      PT Plan Current plan remains appropriate    Co-evaluation              AM-PAC PT "6 Clicks" Mobility   Outcome Measure  Help needed turning from your back to your side while in a flat bed without using bedrails?: A Lot Help needed moving from lying on your back to sitting on the side of a flat bed without using bedrails?: A Lot Help needed moving to and from a bed to a chair (including a wheelchair)?: A Lot Help needed standing up from a chair using your arms (e.g., wheelchair or bedside chair)?: A Lot Help needed to walk in hospital room?: Total Help needed climbing 3-5 steps with a railing? : Total 6 Click Score: 10    End of Session Equipment Utilized During Treatment: Gait belt Activity Tolerance: Patient limited by pain Patient left: in chair;with call bell/phone within reach;with chair alarm set Nurse Communication: Mobility status PT Visit Diagnosis: Unsteadiness on feet (R26.81);Muscle weakness (generalized) (M62.81);Pain;Difficulty in walking, not elsewhere classified (R26.2) Pain - Right/Left: Left Pain - part of body: Hip     Time: 0973-5329 PT Time Calculation (min) (ACUTE ONLY): 20 min  Charges:  $Gait Training: 8-22 mins                     Kennie Snedden A. Gilford Rile PT, DPT Acute Rehabilitation Services Pager [phone] Office [phone]    Linna Hoff 08/29/2021, 10:36 AM

## 2021-08-29 NOTE — Progress Notes (Signed)
° °  Subjective: 2 Days Post-Op Procedure(s) (LRB): HEMI HIP ARTHROPLASTY ANTERIOR APPROACH (Left) Patient reports pain as mild and moderate.  "I got light headed when they got me out of bed and put me in this chair"  Objective: Vital signs in last 24 hours: Temp:  [98.9 F (37.2 C)-100 F (37.8 C)] 98.9 F (37.2 C) (02/05 0745) Pulse Rate:  [58-71] 65 (02/05 0745) Resp:  [16-18] 16 (02/05 0745) BP: (110-123)/(51-80) 110/80 (02/05 0745) SpO2:  [93 %-96 %] 93 % (02/05 0745)  Intake/Output from previous day: 02/04 0701 - 02/05 0700 In: 480 [P.O.:480] Out: 600 [Urine:600] Intake/Output this shift: Total I/O In: 160 [P.O.:160] Out: 100 [Urine:100]  Recent Labs    08/26/21 1630 08/27/21 0415 08/28/21 0253  HGB 12.6 12.2 12.2   Recent Labs    08/27/21 0415 08/28/21 0253  WBC 10.5 9.6  RBC 4.05 4.06  HCT 36.5 36.4  PLT 163 174   Recent Labs    08/26/21 1630 08/27/21 0415  NA 140 138  K 4.0 3.7  CL 105 103  CO2 26 28  BUN 21 18  CREATININE 0.92 0.86  GLUCOSE 105* 107*  CALCIUM 9.2 8.7*   Recent Labs    08/26/21 1630  INR 1.1    Neurologically intact  LLE. Sciatic sensation intact.  No results found.  Assessment/Plan: 2 Days Post-Op Procedure(s) (LRB): HEMI HIP ARTHROPLASTY ANTERIOR APPROACH (Left) Up with therapy  Katelyn Morris 08/29/2021, 11:44 AM

## 2021-08-30 LAB — CBC
HCT: 32 % — ABNORMAL LOW (ref 36.0–46.0)
Hemoglobin: 10.9 g/dL — ABNORMAL LOW (ref 12.0–15.0)
MCH: 30.4 pg (ref 26.0–34.0)
MCHC: 34.1 g/dL (ref 30.0–36.0)
MCV: 89.4 fL (ref 80.0–100.0)
Platelets: 160 10*3/uL (ref 150–400)
RBC: 3.58 MIL/uL — ABNORMAL LOW (ref 3.87–5.11)
RDW: 12.8 % (ref 11.5–15.5)
WBC: 8.6 10*3/uL (ref 4.0–10.5)
nRBC: 0 % (ref 0.0–0.2)

## 2021-08-30 LAB — BASIC METABOLIC PANEL
Anion gap: 9 (ref 5–15)
BUN: 20 mg/dL (ref 8–23)
CO2: 28 mmol/L (ref 22–32)
Calcium: 8.2 mg/dL — ABNORMAL LOW (ref 8.9–10.3)
Chloride: 99 mmol/L (ref 98–111)
Creatinine, Ser: 0.84 mg/dL (ref 0.44–1.00)
GFR, Estimated: >60 mL/min (ref >60)
Glucose, Bld: 142 mg/dL — ABNORMAL HIGH (ref 70–99)
Potassium: 3.1 mmol/L — ABNORMAL LOW (ref 3.5–5.1)
Sodium: 136 mmol/L (ref 135–145)

## 2021-08-30 MED ORDER — DOCUSATE SODIUM 100 MG PO CAPS
100.0000 mg | ORAL_CAPSULE | Freq: Two times a day (BID) | ORAL | Status: DC
  Administered 2021-08-30 – 2021-09-02 (×5): 100 mg via ORAL
  Filled 2021-08-30 (×7): qty 1

## 2021-08-30 MED ORDER — POTASSIUM CHLORIDE CRYS ER 20 MEQ PO TBCR
40.0000 meq | EXTENDED_RELEASE_TABLET | ORAL | Status: AC
  Administered 2021-08-30 (×2): 40 meq via ORAL
  Filled 2021-08-30 (×2): qty 2

## 2021-08-30 MED ORDER — BISACODYL 10 MG RE SUPP
10.0000 mg | Freq: Once | RECTAL | Status: AC
  Administered 2021-08-30: 10 mg via RECTAL
  Filled 2021-08-30: qty 1

## 2021-08-30 NOTE — NC FL2 (Signed)
[place] MEDICAID FL2 LEVEL OF CARE SCREENING TOOL     IDENTIFICATION  Patient Name: Katelyn Morris Birthdate: 1923-02-09 Sex: female Admission Date (Current Location): 08/26/2021  [facility] and [place] Number:  Herbalist and Address:  [facility], [place] [address], [place], [place] [address]      Provider Number: [id number]  Attending Physician Name and Address:  Darliss Cheney, MD  Relative Name and Phone Number:       Current Level of Care: Hospital Recommended Level of Care: [place] Prior Approval Number:    Date Approved/Denied:   PASRR Number: [id number]  Discharge Plan: SNF    Current Diagnoses: Patient Active Problem List   Diagnosis Date Noted   Closed left hip fracture, initial encounter ([place]) 08/26/2021   Anxiety 08/26/2021   Abnormal CT scan, cervical spine 08/26/2021   CKD (chronic kidney disease) stage 3, GFR 30-59 ml/min ([place]) 08/26/2021   PAD (peripheral artery disease) ([place]) 01/03/2020   Hypercholesterolemia 03/55/9741   Diastolic dysfunction 63/84/5364   Peripheral venous insufficiency 01/03/2020   Orthostatic hypotension 02/11/2019   Varicose veins of left leg with edema 02/11/2019   Paroxysmal SVT (supraventricular tachycardia) ([facility]) 08/20/2015   Cardiac murmur 08/06/2014   Right carotid bruit 08/06/2014   SSS (sick sinus syndrome) s/p PPM 08/06/2014   Pacemaker - Medtronic Revo, dual chamber 2013 08/05/2013   Edema of both legs 08/05/2013   Bradycardia 02/21/2012   Pre-syncope 02/21/2012   Hypothyroidism 08/30/2010   Essential hypertension 08/30/2010   DIVERTICULAR DISEASE 08/30/2010   ABDOMINAL BLOATING 08/30/2010   TUBULOVILLOUS ADENOMA, COLON, HX OF 08/30/2010    Orientation RESPIRATION BLADDER Height & Weight     Self, Time, Situation, Place  Normal Continent Weight: 115 lb (52.2 kg) Height:  5\' 2"  (157.5 cm)  BEHAVIORAL SYMPTOMS/MOOD NEUROLOGICAL BOWEL NUTRITION STATUS       Continent Diet (please see discharge summary)  AMBULATORY STATUS COMMUNICATION OF NEEDS Skin     Verbally Surgical wounds (closed incision Lft Hip)                       Personal Care Assistance Level of Assistance  Bathing, Feeding, Dressing           Functional Limitations Info  Sight, Hearing, Speech Sight Info: Adequate Hearing Info: Impaired Speech Info: Adequate    SPECIAL CARE FACTORS FREQUENCY  PT (By licensed PT), OT (By licensed OT)     PT Frequency: 5x per week OT Frequency: 5x per week            Contractures Contractures Info: Not present    Additional Factors Info  Code Status, Allergies Code Status Info: DNR Allergies Info: Trazodone Hcl,Sulfa Antibiotics,Sulfonamide ,Derivatives           Current Medications (08/30/2021):  This is the current hospital active medication list Current Facility-Administered Medications  Medication Dose Route Frequency Provider Last Rate Last Admin   acetaminophen (TYLENOL) tablet 325 mg  325 mg Oral Q6H PRN Vanetta Mulders, MD   325 mg at 08/29/21 2000   ALPRAZolam Duanne Moron) tablet 0.125 mg  0.125 mg Oral BID PRN Vanetta Mulders, MD   0.125 mg at 08/28/21 0007   atorvastatin (LIPITOR) tablet 10 mg  10 mg Oral Daily Vanetta Mulders, MD   10 mg at 08/30/21 1027   bisacodyl (DULCOLAX) suppository 10 mg  10 mg Rectal Once Darliss Cheney, MD       Chlorhexidine Gluconate Cloth  2 % PADS 6 each  6 each Topical Daily Pahwani, Einar Grad, MD   6 each at 08/30/21 1030   cilostazol (PLETAL) tablet 50 mg  50 mg Oral BID Vanetta Mulders, MD   50 mg at 08/30/21 1027   docusate sodium (COLACE) capsule 100 mg  100 mg Oral BID Darliss Cheney, MD   100 mg at 08/30/21 1203   enoxaparin (LOVENOX) injection 30 mg  30 mg Subcutaneous Q24H Vanetta Mulders, MD   30 mg at 08/30/21 1203   feeding supplement (ENSURE ENLIVE / ENSURE PLUS) liquid 237 mL  237 mL Oral BID BM Pahwani, Einar Grad, MD   237 mL at 08/30/21 1333   Glycerin (Adult) 2.1 g suppository 1  suppository  1 suppository Rectal PRN Darliss Cheney, MD       levothyroxine (SYNTHROID) tablet 50 mcg  50 mcg Oral Q0600 Vanetta Mulders, MD   50 mcg at 08/30/21 0529   methocarbamol (ROBAXIN) tablet 500 mg  500 mg Oral Q6H PRN Vanetta Mulders, MD   500 mg at 08/28/21 0435   Or   methocarbamol (ROBAXIN) 500 mg in dextrose 5 % 50 mL IVPB  500 mg Intravenous Q6H PRN Vanetta Mulders, MD   Stopped at 08/27/21 0506   metoprolol succinate (TOPROL-XL) 24 hr tablet 37.5 mg  37.5 mg Oral Daily Vanetta Mulders, MD   37.5 mg at 08/30/21 1027   morphine (PF) 2 MG/ML injection 1 mg  1 mg Intravenous Q4H PRN Vanetta Mulders, MD   1 mg at 08/28/21 0433   ondansetron (ZOFRAN) injection 4 mg  4 mg Intravenous Q6H PRN Vanetta Mulders, MD   4 mg at 08/26/21 1729   oxyCODONE (Oxy IR/ROXICODONE) immediate release tablet 5 mg  5 mg Oral Q6H PRN Vanetta Mulders, MD   5 mg at 08/30/21 1114   potassium chloride SA (KLOR-CON M) CR tablet 40 mEq  40 mEq Oral Q4H Darliss Cheney, MD   40 mEq at 08/30/21 1337     Discharge Medications: Please see discharge summary for a list of discharge medications.  Relevant Imaging Results:  Relevant Lab Results:   Additional Information SSN [id number]  Vinie Sill, LCSW

## 2021-08-30 NOTE — Progress Notes (Signed)
° °  Subjective:  Patient reports pain as mild, sleeping on arrival. Worked with PT with max assistance.  Objective:   VITALS:   Vitals:   08/29/21 0745 08/29/21 1558 08/29/21 2144 08/30/21 0406  BP: 110/80 (!) 142/61 (!) 108/58 (!) 144/57  Pulse: 65 83 60 67  Resp: 16 16 17 16   Temp: 98.9 F (37.2 C) 98.6 F (37 C) 98.8 F (37.1 C) 98.7 F (37.1 C)  TempSrc: Oral Oral  Oral  SpO2: 93% 97% 96% 95%  Weight:      Height:       Dressing is CDI Neurologically intact Neurovascular intact Sensation intact distally Intact pulses distally Fires TA/EHL  Lab Results  Component Value Date   WBC 9.6 08/28/2021   HGB 12.2 08/28/2021   HCT 36.4 08/28/2021   MCV 89.7 08/28/2021   PLT 174 08/28/2021     Assessment/Plan:  3 Days Post-Op sp left hip hemiarthroplasty  - Expected postop acute blood loss anemia - will monitor for symptoms - Patient to work with PT/OT to optimize mobilization safely - DVT ppx - SCDs, ambulation, Lovenox daily - Postoperative Abx: Ancef x 2 additional doses given - WBAT operative extremity - Direct anterior precautions - Pain control - multimodal pain management, ATC acetaminophen in conjunction with as needed narcotic (oxycodone), although this should be minimized with other modalities  - Discharge planning pending CM, appreciate coordination   Katelyn Morris 08/30/2021, 7:18 AM

## 2021-08-30 NOTE — Care Management Important Message (Signed)
Important Message  Patient Details  Name: Katelyn Morris MRN: [id number] Date of Birth: [birth year]   Medicare Important Message Given:  Yes     Hannah Beat 08/30/2021, 11:53 AM

## 2021-08-30 NOTE — Progress Notes (Addendum)
PROGRESS NOTE    Katelyn Morris  XHB:[id number] DOB: [birth year] DOA: 08/26/2021 PCP: Lawerance Cruel, MD   Brief Narrative:  Katelyn Morris is a 88 y.o. female with medical history significant of HTN, hypothyroidism, SSS s/p PPM placement, CKD stage 3a, HLD, PAD, anxiety presented to ED after mechanical fall while working in the kitchen. She denies hitting her head or losing consciousness. She denies any neck pain. She had immediate pain to her left leg and couldn't bear weight.    Upon arrival to ED, she was hemodynamically stable.  She was diagnosed with displaced left femoral neck fracture. CT cervical: subtle calcification along the anterior inferior aspect of the lateral mass of C1 w/out associated prevertebral soft tissue swelling in the area. Cannot exclude subtle fracture but it could be artifact as well.  Patient with no neck pain.  Admitted under [place].  Orthopedics consulted.  Assessment & Plan:   Principal Problem:   Closed left hip fracture, initial encounter [facility]) Active Problems:   Hypothyroidism   Essential hypertension   SSS (sick sinus syndrome) s/p PPM   PAD (peripheral artery disease) (HCC)   Hypercholesterolemia   Anxiety   Abnormal CT scan, cervical spine   CKD (chronic kidney disease) stage 3, GFR 30-59 ml/min (HCC)  Closed left hip fracture, initial encounter ([place])- (present on admission): S/p left hip hemiarthroplasty.  Pain controlled.  Management per orthopedics.  Acute blood loss anemia/postoperative anemia: Hemoglobin dropped to 10.9.  Monitor closely.  No indication for transfusion yet.  UTI: Urine culture grew E. coli which is pansensitive.  Completed course of Rocephin.   Abnormal CT scan, cervical spine- (present on admission) Questionable C1 subtle fracture. Could not rule out due to artifact -patient denies hitting head and denies neck pain -continue to monitor    Hypercholesterolemia- (present on admission) Cholesterol well controlled,  continue lipitor    Essential hypertension- (present on admission): Blood pressure controlled. Continue her metoprolol (hx of PAT) , hold hctz at this time.    CKD (chronic kidney disease) stage 3, GFR 30-59 ml/min (HCC) baseline creatinine 1.1, stable.   PAD (peripheral artery disease) ([place])- (present on admission) Continue statin and pletal.  Resume aspirin postoperatively.   SSS (sick sinus syndrome) s/p PPM- (present on admission) PPM in 2013, regular interrogation checks, followed by cardiology  Denies any palpitations/chest pain prior to fall    Hypothyroidism- (present on admission) Continue synthroid TSH wnl 01/2021   Anxiety- (present on admission) Continue low dose xanax bid   Constipation: We will start on Colace per family request and also give her dose of Dulcolax suppository  Hypokalemia: We will replace.  DVT prophylaxis: enoxaparin (LOVENOX) injection 30 mg Start: 08/28/21 1200 SCDs Start: 08/26/21 1851   Code Status: DNR  Family Communication:  None present at bedside.  Plan of care discussed with patient in length and he/she verbalized understanding and agreed with it.  Status is: Inpatient Remains inpatient appropriate because: Needs SNF placement.  Estimated body mass index is 21.03 kg/m as calculated from the following:   Height as of this encounter: 5\' 2"  (1.575 m).   Weight as of this encounter: 52.2 kg.    Nutritional Assessment: Body mass index is 21.03 kg/m.Marland Kitchen Seen by dietician.  I agree with the assessment and plan as outlined below: Nutrition Status: Nutrition Problem: Increased nutrient needs Etiology: hip fracture Signs/Symptoms: estimated needs Interventions: Refer to RD note for recommendations  . Skin Assessment: I have examined the patient's skin and I  agree with the wound assessment as performed by the wound care RN as outlined below:    Consultants:  Orthopedics  Procedures:  Left hip hemiarthroplasty  Antimicrobials:   Anti-infectives (From admission, onward)    Start     Dose/Rate Route Frequency Ordered Stop   08/27/21 2200  ceFAZolin (ANCEF) IVPB 2g/100 mL premix  Status:  Discontinued        2 g 200 mL/hr over 30 Minutes Intravenous Every 8 hours 08/27/21 1857 08/27/21 1924   08/27/21 1715  vancomycin (VANCOCIN) powder  Status:  Discontinued          As needed 08/27/21 1724 08/27/21 1738   08/27/21 0915  ceFAZolin (ANCEF) IVPB 2g/100 mL premix        2 g 200 mL/hr over 30 Minutes Intravenous On call to O.R. 08/27/21 2683 08/27/21 1611   08/27/21 0915  cefTRIAXone (ROCEPHIN) 1 g in sodium chloride 0.9 % 100 mL IVPB        1 g 200 mL/hr over 30 Minutes Intravenous Every 24 hours 08/27/21 0822 08/29/21 1000         Subjective:  Patient seen and examined.  She has no pain.  She is excessively worried about IV line in the right antecubital fossa.  She is worried that she will get bone infection.  She was reassured  Objective: Vitals:   08/29/21 1558 08/29/21 2144 08/30/21 0406 08/30/21 0805  BP: (!) 142/61 (!) 108/58 (!) 144/57 (!) 167/59  Pulse: 83 60 67 62  Resp: 16 17 16 19   Temp: 98.6 F (37 C) 98.8 F (37.1 C) 98.7 F (37.1 C) 98.3 F (36.8 C)  TempSrc: Oral  Oral Oral  SpO2: 97% 96% 95% 95%  Weight:      Height:        Intake/Output Summary (Last 24 hours) at 08/30/2021 1209 Last data filed at 08/30/2021 0500 Gross per 24 hour  Intake 720 ml  Output --  Net 720 ml    Filed Weights   08/26/21 1519  Weight: 52.2 kg    Examination:  General exam: Appears calm and comfortable  Respiratory system: Clear to auscultation. Respiratory effort normal. Cardiovascular system: S1 & S2 heard, RRR. No JVD, murmurs, rubs, gallops or clicks. No pedal edema. Gastrointestinal system: Abdomen is nondistended, soft and nontender. No organomegaly or masses felt. Normal bowel sounds heard. Central nervous system: Alert and oriented. No focal neurological deficits. Skin: No rashes, lesions or  ulcers.  Psychiatry: Judgement and insight appear normal. Mood & affect appropriate.    Data Reviewed: I have personally reviewed following labs and imaging studies  CBC: Recent Labs  Lab 08/26/21 1630 08/27/21 0415 08/28/21 0253 08/30/21 0848  WBC 10.3 10.5 9.6 8.6  NEUTROABS 8.8*  --  7.7  --   HGB 12.6 12.2 12.2 10.9*  HCT 38.6 36.5 36.4 32.0*  MCV 91.5 90.1 89.7 89.4  PLT 184 163 174 419    Basic Metabolic Panel: Recent Labs  Lab 08/26/21 1630 08/27/21 0415 08/30/21 0848  NA 140 138 136  K 4.0 3.7 3.1*  CL 105 103 99  CO2 26 28 28   GLUCOSE 105* 107* 142*  BUN 21 18 20   CREATININE 0.92 0.86 0.84  CALCIUM 9.2 8.7* 8.2*    GFR: Estimated Creatinine Clearance: 31 mL/min (by C-G formula based on SCr of 0.84 mg/dL). Liver Function Tests: Recent Labs  Lab 08/26/21 1630  AST 27  ALT 27  ALKPHOS 58  BILITOT 1.1  PROT  6.0*  ALBUMIN 3.5    No results for input(s): LIPASE, AMYLASE in the last 168 hours. No results for input(s): AMMONIA in the last 168 hours. Coagulation Profile: Recent Labs  Lab 08/26/21 1630  INR 1.1    Cardiac Enzymes: Recent Labs  Lab 08/27/21 0415  CKTOTAL 663*    BNP (last 3 results) No results for input(s): PROBNP in the last 8760 hours. HbA1C: No results for input(s): HGBA1C in the last 72 hours. CBG: No results for input(s): GLUCAP in the last 168 hours. Lipid Profile: No results for input(s): CHOL, HDL, LDLCALC, TRIG, CHOLHDL, LDLDIRECT in the last 72 hours. Thyroid Function Tests: No results for input(s): TSH, T4TOTAL, FREET4, T3FREE, THYROIDAB in the last 72 hours. Anemia Panel: No results for input(s): VITAMINB12, FOLATE, FERRITIN, TIBC, IRON, RETICCTPCT in the last 72 hours. Sepsis Labs: No results for input(s): PROCALCITON, LATICACIDVEN in the last 168 hours.  Recent Results (from the past 240 hour(s))  Resp Panel by RT-PCR (Flu A&B, Covid) Nasopharyngeal Swab     Status: None   Collection Time: 08/26/21  3:02  AM   Specimen: Nasopharyngeal Swab; Nasopharyngeal(NP) swabs in vial transport medium  Result Value Ref Range Status   SARS Coronavirus 2 by RT PCR NEGATIVE NEGATIVE Final    Comment: (NOTE) SARS-CoV-2 target nucleic acids are NOT DETECTED.  The SARS-CoV-2 RNA is generally detectable in upper respiratory specimens during the acute phase of infection. The lowest concentration of SARS-CoV-2 viral copies this assay can detect is 138 copies/mL. A negative result does not preclude SARS-Cov-2 infection and should not be used as the sole basis for treatment or other patient management decisions. A negative result may occur with  improper specimen collection/handling, submission of specimen other than nasopharyngeal swab, presence of viral mutation(s) within the areas targeted by this assay, and inadequate number of viral copies(<138 copies/mL). A negative result must be combined with clinical observations, patient history, and epidemiological information. The expected result is Negative.  Fact Sheet for Patients:  EntrepreneurPulse.com.au  Fact Sheet for Healthcare Providers:  IncredibleEmployment.be  This test is no t yet approved or cleared by the [place] FDA and  has been authorized for detection and/or diagnosis of SARS-CoV-2 by FDA under an Emergency Use Authorization (EUA). This EUA will remain  in effect (meaning this test can be used) for the duration of the COVID-19 declaration under Section 564(b)(1) of the Act, 21 U.S.C.section 360bbb-3(b)(1), unless the authorization is terminated  or revoked sooner.       Influenza A by PCR NEGATIVE NEGATIVE Final   Influenza B by PCR NEGATIVE NEGATIVE Final    Comment: (NOTE) The Xpert Xpress SARS-CoV-2/FLU/RSV plus assay is intended as an aid in the diagnosis of influenza from Nasopharyngeal swab specimens and should not be used as a sole basis for treatment. Nasal washings and aspirates are  unacceptable for Xpert Xpress SARS-CoV-2/FLU/RSV testing.  Fact Sheet for Patients: EntrepreneurPulse.com.au  Fact Sheet for Healthcare Providers: IncredibleEmployment.be  This test is not yet approved or cleared by the [place] FDA and has been authorized for detection and/or diagnosis of SARS-CoV-2 by FDA under an Emergency Use Authorization (EUA). This EUA will remain in effect (meaning this test can be used) for the duration of the COVID-19 declaration under Section 564(b)(1) of the Act, 21 U.S.C. section 360bbb-3(b)(1), unless the authorization is terminated or revoked.  Performed at [facility], [place] [address] [place], [place] [address]   Urine Culture     Status: Abnormal  Collection Time: 08/27/21  9:05 AM   Specimen: Urine, Clean Catch  Result Value Ref Range Status   Specimen Description URINE, CLEAN CATCH  Final   Special Requests   Final    NONE Performed at [facility], [place] [address] [place], [place] [address]    Culture >=100,000 COLONIES/mL ESCHERICHIA COLI (A)  Final   Report Status 08/29/2021 FINAL  Final   Organism ID, Bacteria ESCHERICHIA COLI (A)  Final      Susceptibility   Escherichia coli - MIC*    AMPICILLIN <=2 SENSITIVE Sensitive     CEFAZOLIN <=4 SENSITIVE Sensitive     CEFEPIME <=0.12 SENSITIVE Sensitive     CEFTRIAXONE <=0.25 SENSITIVE Sensitive     CIPROFLOXACIN <=0.25 SENSITIVE Sensitive     GENTAMICIN <=1 SENSITIVE Sensitive     IMIPENEM <=0.25 SENSITIVE Sensitive     NITROFURANTOIN <=16 SENSITIVE Sensitive     TRIMETH/SULFA <=20 SENSITIVE Sensitive     AMPICILLIN/SULBACTAM <=2 SENSITIVE Sensitive     PIP/TAZO <=4 SENSITIVE Sensitive     * >=100,000 COLONIES/mL ESCHERICHIA COLI  Surgical PCR Screen     Status: None   Collection Time: 08/27/21  9:12 AM   Specimen: Urine, Catheterized; Nasal Swab  Result Value Ref Range Status   MRSA, PCR NEGATIVE NEGATIVE Final   Staphylococcus  aureus NEGATIVE NEGATIVE Final    Comment: (NOTE) The Xpert SA Assay (FDA approved for NASAL specimens in patients 73 years of age and older), is one component of a comprehensive surveillance program. It is not intended to diagnose infection nor to guide or monitor treatment. Performed at [facility], [place] [address] [place], [place] [address]       Radiology Studies: No results found.  Scheduled Meds:  atorvastatin  10 mg Oral Daily   bisacodyl  10 mg Rectal Once   Chlorhexidine Gluconate Cloth  6 each Topical Daily   cilostazol  50 mg Oral BID   docusate sodium  100 mg Oral BID   enoxaparin (LOVENOX) injection  30 mg Subcutaneous Q24H   feeding supplement  237 mL Oral BID BM   levothyroxine  50 mcg Oral Q0600   metoprolol succinate  37.5 mg Oral Daily   Continuous Infusions:  methocarbamol (ROBAXIN) IV Stopped (08/27/21 0506)     LOS: 4 days   Time spent: 26 minutes  Darliss Cheney, MD [facility]  08/30/2021, 12:09 PM  Please page via [facility] and do not message via secure chat for urgent patient care matters. Secure chat can be used for non urgent patient care matters.  How to contact the [facility] Attending or Consulting provider [place] or covering provider during after hours [place], for this patient?  Check the care team in [facility] and look for a) attending/consulting TRH provider listed and b) the [facility] team listed. Page or secure chat [phone]. Log into [email or web address] and use [place]'s universal password to access. If you do not have the password, please contact the hospital operator. Locate the [facility] provider you are looking for under [facility] and page to a number that you can be directly reached. If you still have difficulty reaching the provider, please page the [facility] (Director on Call) for the Hospitalists listed on amion for assistance.

## 2021-08-30 NOTE — Progress Notes (Signed)
Mobility Specialist: Progress Note   08/30/21 1252  Mobility  Bed Position Chair  Activity Transferred from bed to chair  Level of Assistance Minimal assist, patient does 75% or more  Assistive Device Front wheel walker  Distance Ambulated (ft) 2 ft  Activity Response Tolerated well  $Mobility charge 1 Mobility   Pt required minA to sit EOB as well as to stand with c/o RLE pain during transfer. Pt urinary incontinent upon standing, assisted with pericare. Pt in the recliner with call bell at her side. Chair alarm is on.   [facility] Taaj Hurlbut Mobility Specialist Mobility Specialist 5 North: [phone] Mobility Specialist 6 North: [phone]

## 2021-08-30 NOTE — TOC Initial Note (Signed)
Transition of Care [facility]) - Initial/Assessment Note    Patient Details  Name: Neosha Switalski MRN: [id number] Date of Birth: [birth year]  [facility] CM/SW Contact:    Vinie Sill, LCSW Phone Number: 08/30/2021, 3:30 PM  Clinical Narrative:                  CSW received message form RN to contact patient's daughter in law,Cheryl. CSW called Cheryl,introduced self and explained role. [name] states family are aware of therapy recommendations and family is agreeable to short term rehab at [facility]. SNF preference is [facility] and is agreeable to send clinicals to other SNF as back up to choice. CSW explained the SNF process.  She reports once the patient is discharged from SNF, his  son Garnet Koyanagi and daughter in law Rodena Piety will stay with the patient. She reports [facility], Ladan Vanderzanden and herself is the [place]. No other questions or concerns at this time.   CSW will provide bed offers once available  CSW will continue to follow and assist wifh discharge planning.  Thurmond Butts, MSW, LCSW Clinical Social Worker  Expected Discharge Plan: Skilled Nursing Facility Barriers to Discharge: Continued Medical Work up   Patient Goals and CMS Choice        Expected Discharge Plan and Services Expected Discharge Plan: [place] In-house Referral: Clinical Social Work                                            Prior Living Arrangements/Services     Patient language and need for interpreter reviewed:: No        Need for Family Participation in Patient Care: Yes (Comment) Care giver support system in place?: Yes (comment)   Criminal Activity/Legal Involvement Pertinent to Current Situation/Hospitalization: No - Comment as needed  Activities of Daily Living      Permission Sought/Granted Permission sought to share information with : Family Supports    Share Information with NAME: Astrid Vides  Permission granted to share info w AGENCY:  SNFs  Permission granted to share info w Relationship: daugter in law  Permission granted to share info w Contact Information: [phone]  Emotional Assessment       Orientation: : Oriented to Self, Oriented to Place, Oriented to  Time, Oriented to Situation Alcohol / Substance Use: Not Applicable Psych Involvement: No (comment)  Admission diagnosis:  Fall [W19.XXXA] Closed left hip fracture, initial encounter ([place]) [S72.002A] Closed fracture of left femur, unspecified fracture morphology, unspecified portion of femur, initial encounter ([place]) [S72.92XA] Patient Active Problem List   Diagnosis Date Noted   Closed left hip fracture, initial encounter ([place]) 08/26/2021   Anxiety 08/26/2021   Abnormal CT scan, cervical spine 08/26/2021   CKD (chronic kidney disease) stage 3, GFR 30-59 ml/min ([place]) 08/26/2021   PAD (peripheral artery disease) ([place]) 01/03/2020   Hypercholesterolemia 05/10/5101   Diastolic dysfunction 58/52/7782   Peripheral venous insufficiency 01/03/2020   Orthostatic hypotension 02/11/2019   Varicose veins of left leg with edema 02/11/2019   Paroxysmal SVT (supraventricular tachycardia) ([place]) 08/20/2015   Cardiac murmur 08/06/2014   Right carotid bruit 08/06/2014   SSS (sick sinus syndrome) s/p PPM 08/06/2014   Pacemaker - Medtronic Revo, dual chamber 2013 08/05/2013   Edema of both legs 08/05/2013   Bradycardia 02/21/2012   Pre-syncope 02/21/2012   Hypothyroidism 08/30/2010   Essential hypertension  08/30/2010   DIVERTICULAR DISEASE 08/30/2010   ABDOMINAL BLOATING 08/30/2010   TUBULOVILLOUS ADENOMA, COLON, HX OF 08/30/2010   PCP:  Lawerance Cruel, MD Pharmacy:   [id number] - [place], [place] [address] Phone: [phone] Fax: [phone]  [facility] - [place], [place] [address] Phone: [phone] Fax:  [phone]     Social Determinants of Health (SDOH) Interventions    Readmission Risk Interventions No flowsheet data found.

## 2021-08-31 ENCOUNTER — Encounter (HOSPITAL_COMMUNITY): Payer: Self-pay | Admitting: Orthopaedic Surgery

## 2021-08-31 LAB — BASIC METABOLIC PANEL
Anion gap: 7 (ref 5–15)
BUN: 20 mg/dL (ref 8–23)
CO2: 27 mmol/L (ref 22–32)
Calcium: 8.5 mg/dL — ABNORMAL LOW (ref 8.9–10.3)
Chloride: 107 mmol/L (ref 98–111)
Creatinine, Ser: 0.79 mg/dL (ref 0.44–1.00)
GFR, Estimated: >60 mL/min (ref >60)
Glucose, Bld: 122 mg/dL — ABNORMAL HIGH (ref 70–99)
Potassium: 4.6 mmol/L (ref 3.5–5.1)
Sodium: 141 mmol/L (ref 135–145)

## 2021-08-31 LAB — CK: Total CK: 268 U/L — ABNORMAL HIGH (ref 38–234)

## 2021-08-31 MED ORDER — ASPIRIN 81 MG PO CHEW
81.0000 mg | CHEWABLE_TABLET | Freq: Every day | ORAL | Status: DC
  Administered 2021-08-31: 81 mg via ORAL
  Filled 2021-08-31 (×2): qty 1

## 2021-08-31 MED ORDER — METOPROLOL TARTRATE 5 MG/5ML IV SOLN
5.0000 mg | Freq: Once | INTRAVENOUS | Status: AC
  Administered 2021-08-31: 2.5 mg via INTRAVENOUS
  Filled 2021-08-31: qty 5

## 2021-08-31 NOTE — Progress Notes (Signed)
PROGRESS NOTE    Katelyn Morris  DGU:[id number] DOB: [birth year] DOA: 08/26/2021 PCP: Lawerance Cruel, MD   Brief Narrative:  Katelyn Morris is a 88 y.o. female with medical history significant of HTN, hypothyroidism, SSS s/p PPM placement, CKD stage 3a, HLD, PAD, anxiety presented to ED after mechanical fall while working in the kitchen. She denies hitting her head or losing consciousness. She denies any neck pain. She had immediate pain to her left leg and couldn't bear weight.    Upon arrival to ED, she was hemodynamically stable.  She was diagnosed with displaced left femoral neck fracture. CT cervical: subtle calcification along the anterior inferior aspect of the lateral mass of C1 w/out associated prevertebral soft tissue swelling in the area. Cannot exclude subtle fracture but it could be artifact as well.  Patient with no neck pain.  Admitted under [place].  Orthopedics consulted.  Assessment & Plan:   Principal Problem:   Closed left hip fracture, initial encounter [facility]) Active Problems:   Hypothyroidism   Essential hypertension   SSS (sick sinus syndrome) s/p PPM   PAD (peripheral artery disease) (HCC)   Hypercholesterolemia   Anxiety   Abnormal CT scan, cervical spine   CKD (chronic kidney disease) stage 3, GFR 30-59 ml/min (HCC)  Closed left hip fracture, initial encounter ([place])- (present on admission): S/p left hip hemiarthroplasty, postop day 4..  Pain controlled.  Management per orthopedics.  Acute blood loss anemia/postoperative anemia: Hemoglobin dropped to 10.9.  Monitor closely.  No indication for transfusion yet.  UTI: Urine culture grew E. coli which is pansensitive.  Completed course of Rocephin.   Abnormal CT scan, cervical spine- (present on admission) Questionable C1 subtle fracture. Could not rule out due to artifact -patient denies hitting head and denies neck pain -continue to monitor    Hypercholesterolemia- (present on admission) Cholesterol  well controlled, continue lipitor    Essential hypertension- (present on admission): Blood pressure controlled. Continue her metoprolol (hx of PAT) , hold hctz at this time.    CKD (chronic kidney disease) stage 3, GFR 30-59 ml/min (HCC) baseline creatinine 1.1, stable.   PAD (peripheral artery disease) ([place])- (present on admission) Continue statin and pletal.  Resume aspirin postoperatively.   SSS (sick sinus syndrome) s/p PPM- (present on admission) PPM in 2013, regular interrogation checks, followed by cardiology  Denies any palpitations/chest pain prior to fall    Hypothyroidism- (present on admission) Continue synthroid TSH wnl 01/2021   Anxiety- (present on admission) Continue low dose xanax bid   Constipation: Resolved.  Continue Colace.  Hypokalemia: Resolved.  DVT prophylaxis: enoxaparin (LOVENOX) injection 30 mg Start: 08/28/21 1200 SCDs Start: 08/26/21 1851   Code Status: DNR  Family Communication:  None present at bedside.  Plan of care discussed with patient in length and he/she verbalized understanding and agreed with it.  Status is: Inpatient Remains inpatient appropriate because: Needs SNF placement.  Estimated body mass index is 21.03 kg/m as calculated from the following:   Height as of this encounter: 5\' 2"  (1.575 m).   Weight as of this encounter: 52.2 kg.    Nutritional Assessment: Body mass index is 21.03 kg/m.Marland Kitchen Seen by dietician.  I agree with the assessment and plan as outlined below: Nutrition Status: Nutrition Problem: Increased nutrient needs Etiology: hip fracture Signs/Symptoms: estimated needs Interventions: Refer to RD note for recommendations  . Skin Assessment: I have examined the patient's skin and I agree with the wound assessment as performed by the wound care  RN as outlined below:    Consultants:  Orthopedics  Procedures:  Left hip hemiarthroplasty  Antimicrobials:  Anti-infectives (From admission, onward)    Start      Dose/Rate Route Frequency Ordered Stop   08/27/21 2200  ceFAZolin (ANCEF) IVPB 2g/100 mL premix  Status:  Discontinued        2 g 200 mL/hr over 30 Minutes Intravenous Every 8 hours 08/27/21 1857 08/27/21 1924   08/27/21 1715  vancomycin (VANCOCIN) powder  Status:  Discontinued          As needed 08/27/21 1724 08/27/21 1738   08/27/21 0915  ceFAZolin (ANCEF) IVPB 2g/100 mL premix        2 g 200 mL/hr over 30 Minutes Intravenous On call to O.R. 08/27/21 7253 08/27/21 1611   08/27/21 0915  cefTRIAXone (ROCEPHIN) 1 g in sodium chloride 0.9 % 100 mL IVPB        1 g 200 mL/hr over 30 Minutes Intravenous Every 24 hours 08/27/21 0822 08/29/21 1000         Subjective:  Seen and examined.  She has no complaints.  She is hard of hearing.  Objective: Vitals:   08/31/21 1135 08/31/21 1137 08/31/21 1140 08/31/21 1150  BP: (!) 137/59 (!) 115/51 (!) 111/52 (!) 112/53  Pulse: 68 63 68 70  Resp:   16 18  Temp:    99 F (37.2 C)  TempSrc:      SpO2:  94% 95% 95%  Weight:      Height:        Intake/Output Summary (Last 24 hours) at 08/31/2021 1335 Last data filed at 08/31/2021 0540 Gross per 24 hour  Intake 240 ml  Output 400 ml  Net -160 ml    Filed Weights   08/26/21 1519  Weight: 52.2 kg    Examination:  General exam: Appears calm and comfortable  Respiratory system: Clear to auscultation. Respiratory effort normal. Cardiovascular system: S1 & S2 heard, RRR. No JVD, murmurs, rubs, gallops or clicks. No pedal edema. Gastrointestinal system: Abdomen is nondistended, soft and nontender. No organomegaly or masses felt. Normal bowel sounds heard. Central nervous system: Alert and oriented. No focal neurological deficits. Skin: No rashes, lesions or ulcers.  Psychiatry: Judgement and insight appear normal. Mood & affect appropriate.   Data Reviewed: I have personally reviewed following labs and imaging studies  CBC: Recent Labs  Lab 08/26/21 1630 08/27/21 0415 08/28/21 0253  08/30/21 0848  WBC 10.3 10.5 9.6 8.6  NEUTROABS 8.8*  --  7.7  --   HGB 12.6 12.2 12.2 10.9*  HCT 38.6 36.5 36.4 32.0*  MCV 91.5 90.1 89.7 89.4  PLT 184 163 174 664    Basic Metabolic Panel: Recent Labs  Lab 08/26/21 1630 08/27/21 0415 08/30/21 0848 08/31/21 0301  NA 140 138 136 141  K 4.0 3.7 3.1* 4.6  CL 105 103 99 107  CO2 26 28 28 27   GLUCOSE 105* 107* 142* 122*  BUN 21 18 20 20   CREATININE 0.92 0.86 0.84 0.79  CALCIUM 9.2 8.7* 8.2* 8.5*    GFR: Estimated Creatinine Clearance: 32.5 mL/min (by C-G formula based on SCr of 0.79 mg/dL). Liver Function Tests: Recent Labs  Lab 08/26/21 1630  AST 27  ALT 27  ALKPHOS 58  BILITOT 1.1  PROT 6.0*  ALBUMIN 3.5    No results for input(s): LIPASE, AMYLASE in the last 168 hours. No results for input(s): AMMONIA in the last 168 hours. Coagulation Profile: Recent Labs  Lab 08/26/21 1630  INR 1.1    Cardiac Enzymes: Recent Labs  Lab 08/27/21 0415 08/31/21 0301  CKTOTAL 663* 268*    BNP (last 3 results) No results for input(s): PROBNP in the last 8760 hours. HbA1C: No results for input(s): HGBA1C in the last 72 hours. CBG: No results for input(s): GLUCAP in the last 168 hours. Lipid Profile: No results for input(s): CHOL, HDL, LDLCALC, TRIG, CHOLHDL, LDLDIRECT in the last 72 hours. Thyroid Function Tests: No results for input(s): TSH, T4TOTAL, FREET4, T3FREE, THYROIDAB in the last 72 hours. Anemia Panel: No results for input(s): VITAMINB12, FOLATE, FERRITIN, TIBC, IRON, RETICCTPCT in the last 72 hours. Sepsis Labs: No results for input(s): PROCALCITON, LATICACIDVEN in the last 168 hours.  Recent Results (from the past 240 hour(s))  Resp Panel by RT-PCR (Flu A&B, Covid) Nasopharyngeal Swab     Status: None   Collection Time: 08/26/21  3:02 AM   Specimen: Nasopharyngeal Swab; Nasopharyngeal(NP) swabs in vial transport medium  Result Value Ref Range Status   SARS Coronavirus 2 by RT PCR NEGATIVE NEGATIVE  Final    Comment: (NOTE) SARS-CoV-2 target nucleic acids are NOT DETECTED.  The SARS-CoV-2 RNA is generally detectable in upper respiratory specimens during the acute phase of infection. The lowest concentration of SARS-CoV-2 viral copies this assay can detect is 138 copies/mL. A negative result does not preclude SARS-Cov-2 infection and should not be used as the sole basis for treatment or other patient management decisions. A negative result may occur with  improper specimen collection/handling, submission of specimen other than nasopharyngeal swab, presence of viral mutation(s) within the areas targeted by this assay, and inadequate number of viral copies(<138 copies/mL). A negative result must be combined with clinical observations, patient history, and epidemiological information. The expected result is Negative.  Fact Sheet for Patients:  EntrepreneurPulse.com.au  Fact Sheet for Healthcare Providers:  IncredibleEmployment.be  This test is no t yet approved or cleared by the [place] FDA and  has been authorized for detection and/or diagnosis of SARS-CoV-2 by FDA under an Emergency Use Authorization (EUA). This EUA will remain  in effect (meaning this test can be used) for the duration of the COVID-19 declaration under Section 564(b)(1) of the Act, 21 U.S.C.section 360bbb-3(b)(1), unless the authorization is terminated  or revoked sooner.       Influenza A by PCR NEGATIVE NEGATIVE Final   Influenza B by PCR NEGATIVE NEGATIVE Final    Comment: (NOTE) The Xpert Xpress SARS-CoV-2/FLU/RSV plus assay is intended as an aid in the diagnosis of influenza from Nasopharyngeal swab specimens and should not be used as a sole basis for treatment. Nasal washings and aspirates are unacceptable for Xpert Xpress SARS-CoV-2/FLU/RSV testing.  Fact Sheet for Patients: EntrepreneurPulse.com.au  Fact Sheet for Healthcare  Providers: IncredibleEmployment.be  This test is not yet approved or cleared by the [place] FDA and has been authorized for detection and/or diagnosis of SARS-CoV-2 by FDA under an Emergency Use Authorization (EUA). This EUA will remain in effect (meaning this test can be used) for the duration of the COVID-19 declaration under Section 564(b)(1) of the Act, 21 U.S.C. section 360bbb-3(b)(1), unless the authorization is terminated or revoked.  Performed at [facility], [place] [address] [place], [place] [address]   Urine Culture     Status: Abnormal   Collection Time: 08/27/21  9:05 AM   Specimen: Urine, Clean Catch  Result Value Ref Range Status   Specimen Description URINE, CLEAN CATCH  Final  Special Requests   Final    NONE Performed at [facility], [place] [address] [place], [place] [address]    Culture >=100,000 COLONIES/mL ESCHERICHIA COLI (A)  Final   Report Status 08/29/2021 FINAL  Final   Organism ID, Bacteria ESCHERICHIA COLI (A)  Final      Susceptibility   Escherichia coli - MIC*    AMPICILLIN <=2 SENSITIVE Sensitive     CEFAZOLIN <=4 SENSITIVE Sensitive     CEFEPIME <=0.12 SENSITIVE Sensitive     CEFTRIAXONE <=0.25 SENSITIVE Sensitive     CIPROFLOXACIN <=0.25 SENSITIVE Sensitive     GENTAMICIN <=1 SENSITIVE Sensitive     IMIPENEM <=0.25 SENSITIVE Sensitive     NITROFURANTOIN <=16 SENSITIVE Sensitive     TRIMETH/SULFA <=20 SENSITIVE Sensitive     AMPICILLIN/SULBACTAM <=2 SENSITIVE Sensitive     PIP/TAZO <=4 SENSITIVE Sensitive     * >=100,000 COLONIES/mL ESCHERICHIA COLI  Surgical PCR Screen     Status: None   Collection Time: 08/27/21  9:12 AM   Specimen: Urine, Catheterized; Nasal Swab  Result Value Ref Range Status   MRSA, PCR NEGATIVE NEGATIVE Final   Staphylococcus aureus NEGATIVE NEGATIVE Final    Comment: (NOTE) The Xpert SA Assay (FDA approved for NASAL specimens in patients 37 years of age and older), is one  component of a comprehensive surveillance program. It is not intended to diagnose infection nor to guide or monitor treatment. Performed at [facility], [place] [address] [place],  [address]       Radiology Studies: No results found.  Scheduled Meds:  atorvastatin  10 mg Oral Daily   Chlorhexidine Gluconate Cloth  6 each Topical Daily   cilostazol  50 mg Oral BID   docusate sodium  100 mg Oral BID   enoxaparin (LOVENOX) injection  30 mg Subcutaneous Q24H   feeding supplement  237 mL Oral BID BM   levothyroxine  50 mcg Oral Q0600   metoprolol succinate  37.5 mg Oral Daily   Continuous Infusions:  methocarbamol (ROBAXIN) IV Stopped (08/27/21 0506)     LOS: 5 days   Time spent: 25 minutes  Darliss Cheney, MD [facility]  08/31/2021, 1:35 PM  Please page via [place] [place] and do not message via secure chat for urgent patient care matters. Secure chat can be used for non urgent patient care matters.  How to contact the [facility] Attending or Consulting provider [place] or covering provider during after hours [place], for this patient?  Check the care team in [facility] and look for a) attending/consulting TRH provider listed and b) the [facility] team listed. Page or secure chat [phone]. Log into [email or web address] and use [place]'s universal password to access. If you do not have the password, please contact the hospital operator. Locate the [facility] provider you are looking for under [facility] and page to a number that you can be directly reached. If you still have difficulty reaching the provider, please page the [facility] (Director on Call) for the Hospitalists listed on amion for assistance.

## 2021-08-31 NOTE — Progress Notes (Signed)
Physical Therapy Treatment Patient Details Name: Katelyn Morris MRN: [id number] DOB: 1923-02-15 Today's Date: 08/31/2021   History of Present Illness 88 yo female wtih onset of fall at home was admitted on 08/26/21 for L hip hemiarthroplasty for femoral neck fracture.  Pt is referred to PT to assess her ability to move and go home vs rehab.  PMHx:  heart murmur, hypothyroidism, pacer for SSS, bradycardia, HTN, hypothyroidism    PT Comments    Pt received in supine and agreeable to session with fair tolerance for bed mobility, transfers, and gait training. Pt able to roll to both sides with supervision and tactile cues for peri-care before coming to EOB with mod assist for LE and trunk management. Up to min assist needed for transfers and brief ambulation. Pt stated she did not want to ambulate further this session, distance also limited by pain and some RLE cramping. Pt with good tolerance for LE and trunk stability exercise. Pt positioned to comfort in recliner and educated on importance of continued mobility. Plan to progress ambulation next session with chair follow for safety. Pt continues to benefit from skilled PT services to progress toward functional mobility goals.    Recommendations for follow up therapy are one component of a multi-disciplinary discharge planning process, led by the attending physician.  Recommendations may be updated based on patient status, additional functional criteria and insurance authorization.  Follow Up Recommendations  Skilled nursing-short term rehab (<3 hours/day)     Assistance Recommended at Discharge Frequent or constant Supervision/Assistance  Patient can return home with the following A lot of help with walking and/or transfers;A lot of help with bathing/dressing/bathroom;Assistance with cooking/housework;Help with stairs or ramp for entrance;Assist for transportation   Equipment Recommendations  Rolling walker (2 wheels)    Recommendations for  Other Services       Precautions / Restrictions Precautions Precautions: Fall Precaution Comments: no formal precautions but protective for C1 change Restrictions Weight Bearing Restrictions: Yes LLE Weight Bearing: Weight bearing as tolerated     Mobility  Bed Mobility Overal bed mobility: Needs Assistance Bed Mobility: Supine to Sit Rolling: Supervision   Supine to sit: Mod assist     General bed mobility comments: mod A to bring LEs to EOB and to fully elvate trunk.    Transfers Overall transfer level: Needs assistance Equipment used: Rolling walker (2 wheels) Transfers: Sit to/from Stand, Bed to chair/wheelchair/BSC Sit to Stand: Min assist       General transfer comment: min A to stand from EOB with multimodal cues needed for safe hand placement. good eccentric control to come to sitting in recliner.    Ambulation/Gait Ambulation/Gait assistance: Min assist Gait Distance (Feet): 5 Feet Assistive device: Rolling walker (2 wheels) Gait Pattern/deviations: Step-to pattern, Decreased stride length, Decreased weight shift to left, Trunk flexed Gait velocity: reduced     General Gait Details: min assist for RW management to walk from EOB to chair. flexed posture but difficullty cueing as pt unable to hear cues.   Stairs             Wheelchair Mobility    Modified Rankin (Stroke Patients Only)       Balance Overall balance assessment: Needs assistance Sitting-balance support: Feet supported Sitting balance-Leahy Scale: Good Sitting balance - Comments: good sitting balance at EOB   Standing balance support: Bilateral upper extremity supported, Reliant on assistive device for balance Standing balance-Leahy Scale: Poor Standing balance comment: reliant on UE support  Cognition Arousal/Alertness: Awake/alert Behavior During Therapy: WFL for tasks assessed/performed Overall Cognitive Status: Difficult to assess     General Comments: severely  HOH and difficult to assess cognition, does better when you speak directly into L ear LOUDLY. states she left her hearing aids at home        Exercises General Exercises - Lower Extremity Short Arc Quad: AROM, AAROM, Both, 10 reps, Seated Hip Flexion/Marching: AROM, AAROM, Both, 20 reps, Seated, Standing Heel Raises: AROM, Both, 10 reps, Seated Other Exercises Other Exercises: modified sit up in chair with [place] support. x10    General Comments General comments (skin integrity, edema, etc.): VSS, HR stable between 73-75bpm throughout session      Pertinent Vitals/Pain Pain Assessment Pain Assessment: Faces Faces Pain Scale: Hurts little more Pain Location: L hip Pain Descriptors / Indicators: Operative site guarding, Grimacing, Aching Pain Intervention(s): Limited activity within patient's tolerance, Monitored during session, Repositioned    Home Living Family/patient expects to be discharged to:: Skilled nursing facility                        Prior Function            PT Goals (current goals can now be found in the care plan section) Acute Rehab PT Goals Patient Stated Goal: to get home as soon as possible PT Goal Formulation: With patient Time For Goal Achievement: 09/11/21    Frequency    Min 3X/week      PT Plan      Co-evaluation              AM-PAC PT "6 Clicks" Mobility   Outcome Measure  Help needed turning from your back to your side while in a flat bed without using bedrails?: A Lot Help needed moving from lying on your back to sitting on the side of a flat bed without using bedrails?: A Lot Help needed moving to and from a bed to a chair (including a wheelchair)?: A Lot Help needed standing up from a chair using your arms (e.g., wheelchair or bedside chair)?: A Lot Help needed to walk in hospital room?: A Lot Help needed climbing 3-5 steps with a railing? : Total 6 Click Score: 11    End of Session Equipment Utilized During  Treatment: Gait belt Activity Tolerance: Patient limited by pain;Patient limited by fatigue Patient left: in chair;with call bell/phone within reach;with chair alarm set Nurse Communication: Mobility status PT Visit Diagnosis: Unsteadiness on feet (R26.81);Muscle weakness (generalized) (M62.81);Pain;Difficulty in walking, not elsewhere classified (R26.2) Pain - Right/Left: Left Pain - part of body: Hip     Time: 2094-7096 PT Time Calculation (min) (ACUTE ONLY): 32 min  Charges:  $Gait Training: 8-22 mins $Therapeutic Activity: 8-22 mins                     Suleika Donavan R. PTA Acute Rehabilitation Services Office: [place] 08/31/2021, 2:39 PM

## 2021-08-31 NOTE — Progress Notes (Signed)
°   08/31/21 1125  Assess: MEWS Score  BP 114/79  Pulse Rate (!) 180  SpO2 96 %  O2 Device Room Air  Assess: MEWS Score  MEWS Temp 0  MEWS Systolic 0  MEWS Pulse 3  MEWS RR 0  MEWS LOC 0  MEWS Score 3  MEWS Score Color Yellow  Assess: if the MEWS score is Yellow or Red  Were vital signs taken at a resting state? Yes  Focused Assessment Change from prior assessment (see assessment flowsheet)  Early Detection of Sepsis Score *See Row Information* Medium  Treat  MEWS Interventions Administered prn meds/treatments;Escalated (See documentation below)  Pain Scale 0-10  Pain Score 0  Take Vital Signs  Increase Vital Sign Frequency  Yellow: Q 2hr X 2 then Q 4hr X 2, if remains yellow, continue Q 4hrs  Escalate  MEWS: Escalate Yellow: discuss with charge nurse/RN and consider discussing with provider and RRT  Notify: Charge Nurse/RN  Name of Charge Nurse/RN Notified Ashara Lounsbury  Date Charge Nurse/RN Notified 08/31/21  Time Charge Nurse/RN Notified 1144  Notify: Provider  Provider Name/Title Pawhani, R  Date Provider Notified 08/31/21  Time Provider Notified 1110  Notification Type Page  Notification Reason Critical result  Provider response See new orders  Date of Provider Response 08/31/21  Time of Provider Response 1115  Notify: Rapid Response  Name of Rapid Response RN Notified Donnetta Simpers  Date Rapid Response Notified 08/31/21  Time Rapid Response Notified 2585  Document  Patient Outcome Stabilized after interventions

## 2021-08-31 NOTE — Significant Event (Signed)
Rapid Response Event Note   Reason for Call : Tachycardia   Initial Focused Assessment:  Pt alert and pleasant upon arrival. She is oriented x2 which is baseline for patient. She denies chest pain or shortness of breath. Lungs are clear bilaterally. Heart sounds S1 and S2 noted. Skin is warm and dry. Palpable pulses throughout. 12-Lead EKG shows SVT with HR in 170s.   VS: BP 124/87, HR 179, RR 18, O2 97 on RA, RR 18, T 100.4 VS after Metoprolol: BP 112/53 and HR 70  Interventions:  -12 Lead EKG -5mg  IV Lopressor ordered. 2.5mg  given with conversion to NSR, HR in 60s. Additional 2.5mg  not given. MD aware.  Plan of Care:  -Continue to monitor HR and BP  -Call RRRN if any additional needs.  Event Summary:   MD Notified: Dr. Burt Ek Call Time: 1104 Arrival Time: 1108 End Time: 1142  Fulton Reek, RN

## 2021-08-31 NOTE — TOC Progression Note (Signed)
Transition of Care [facility]) - Progression Note    Patient Details  Name: Katelyn Morris MRN: [id number] Date of Birth: April 24, 1923  [facility]) CM/SW Contact  Emeterio Reeve, [place] Phone Number: 08/31/2021, 3:43 PM  Clinical Narrative:     CSW gave bed offers to pts daughter in law Katelyn Morris, she chose [facility]. [facility] is able to accept pt at discharge. [facility] will start insurance auth. Pt will need covid test before discharge.   Expected Discharge Plan: [place] Barriers to Discharge: Continued Medical Work up  Expected Discharge Plan and Services Expected Discharge Plan: [place] In-house Referral: Clinical Social Work                                             Social Determinants of Health (SDOH) Interventions    Readmission Risk Interventions No flowsheet data found.  Emeterio Reeve, LCSW Clinical Social Worker

## 2021-09-01 MED ORDER — MELATONIN 3 MG PO TABS
3.0000 mg | ORAL_TABLET | Freq: Every day | ORAL | Status: DC
  Administered 2021-09-01 – 2021-09-02 (×2): 3 mg via ORAL
  Filled 2021-09-01 (×2): qty 1

## 2021-09-01 MED ORDER — ALPRAZOLAM 0.25 MG PO TABS
0.1250 mg | ORAL_TABLET | Freq: Every day | ORAL | Status: DC
  Administered 2021-09-01 – 2021-09-02 (×2): 0.125 mg via ORAL
  Filled 2021-09-01 (×2): qty 1

## 2021-09-01 MED ORDER — QUETIAPINE 12.5 MG HALF TABLET
12.5000 mg | ORAL_TABLET | Freq: Two times a day (BID) | ORAL | Status: DC
  Administered 2021-09-01 – 2021-09-02 (×2): 12.5 mg via ORAL
  Filled 2021-09-01 (×6): qty 1

## 2021-09-01 NOTE — TOC Progression Note (Signed)
Transition of Care [facility]) - Progression Note    Patient Details  Name: Katelyn Morris MRN: [id number] Date of Birth: [birth year]  Transition of Care [facility]) CM/SW Contact  Emeterio Reeve, [place] Phone Number: 09/01/2021, 10:52 AM  Clinical Narrative:     Insurance Josem Kaufmann is pending. CSW will update. Per MD pt is ready for DC when auth is back. CSW requested covid test from MD.   Expected Discharge Plan: [place] Barriers to Discharge: Continued Medical Work up  Expected Discharge Plan and Services Expected Discharge Plan: [place] In-house Referral: Clinical Social Work                                             Social Determinants of Health (SDOH) Interventions    Readmission Risk Interventions No flowsheet data found.  Emeterio Reeve, LCSW Clinical Social Worker

## 2021-09-01 NOTE — Progress Notes (Signed)
PROGRESS NOTE  Katelyn Morris XYO:[id number] DOB: [birth year] DOA: 08/26/2021 PCP: Lawerance Cruel, MD  HPI/Recap of past 24 hours: Katelyn Morris is a 88 y.o. female with medical history significant of HTN, hypothyroidism, SSS s/p PPM placement, CKD3A, HLD, PAD, chronic anxiety on Xanax for years, who presented to [facility] ED from home after a mechanical fall while working in her kitchen.  Upon arrival to ED, work-up revealed left femoral neck fracture.  She is status post repair, left hip hemiarthroplasty on 08/27/2021 by orthopedic surgery, Dr. Sammuel Hines.  CT cervical: subtle calcification along the anterior inferior aspect of the lateral mass of C1 w/out associated prevertebral soft tissue swelling in the area. Cannot exclude subtle fracture but it could be artifact as well.  Patient with no neck pain.   Hospital course complicated by delirium.  Started on delirium management on 09/01/2021.  09/01/2021: Patient was seen and examined at bedside.  She is alert and very confused.  Per her son and daughter-in-law who is an Therapist, sports at bedside at baseline patient is oriented most of the time.  Per family member at bedside she is not at her baseline mentation.  Assessment/Plan: Principal Problem:   Closed left hip fracture, initial encounter [facility]) Active Problems:   Hypothyroidism   Essential hypertension   SSS (sick sinus syndrome) s/p PPM   PAD (peripheral artery disease) (HCC)   Hypercholesterolemia   Anxiety   Abnormal CT scan, cervical spine   CKD (chronic kidney disease) stage 3, GFR 30-59 ml/min (HCC)  Closed left hip fracture, initial encounter ([place])- (present on admission):  S/p left hip hemiarthroplasty on 08/27/2021 by orthopedic surgery, Dr. Sammuel Hines.  Continue pain management and bowel regimen.  Pharmacological DVT prophylaxis. Plan to discharge to SNF once delirium has improved.   Acute blood loss anemia/postoperative anemia:  Hemoglobin dropped to 10.9.   We will repeat CBC in the  morning. We will continue to closely monitor H&H.  Acute metabolic encephalopathy/delirium in the setting of acute illness, recent surgery Delirium precautions started 09/01/2021 Out of bed to chair during the day, reorient frequently Regulate sleep and wake cycle Seroquel 12.5 mg twice daily Melatonin nightly Last QTc on 08/31/2021 was normal.   Treated E. coli UTI:  Urine culture grew E. coli which is pansensitive.   Completed course of Rocephin.   Abnormal CT scan, cervical spine- (present on admission) Questionable C1 subtle fracture. Could not rule out due to artifact -patient denies hitting head and denies neck pain -continue to monitor    Hypercholesterolemia- (present on admission) Cholesterol well controlled, continue lipitor    Essential hypertension- (present on admission), not at goal, elevated. Continue home Toprol-XL 37.5 mg daily.   Hold off home HCTZ to avoid electrolyte abnormalities.  CKD (chronic kidney disease) stage 2 Baseline creatinine appears to be 0.7 with GFR greater than 60. Currently at baseline.  PAD (peripheral artery disease) ([place])- (present on admission) Continue aspirin, statin and pletal.     SSS (sick sinus syndrome) s/p PPM- (present on admission) PPM in 2013, regular interrogation checks, followed by cardiology  Denies any palpitations/chest pain prior to fall    Hypothyroidism- (present on admission) Continue home synthroid TSH wnl 01/2021   Chronic anxiety- (present on admission) Per son and daughter-in-law at bedside patient has been on Xanax for years and takes it regularly. Resume home Xanax 0.25 mg nightly to avoid benzodiazepine withdrawal   Constipation:  Resolved.  Continue Colace.   Resolved post repletion: Hypokalemia:   Critical  care time: 65 minutes.     DVT prophylaxis: enoxaparin (LOVENOX) injection 30 mg Start: 08/28/21 1200 SCDs Start: 08/26/21 1851   Code Status: DNR  Family Communication: Updated the  patient's son and daughter-in-law outside of patient's room.    Estimated body mass index is 21.03 kg/m as calculated from the following:   Height as of this encounter: 5\' 2"  (1.575 m).   Weight as of this encounter: 52.2 kg.   Nutritional Assessment: Body mass index is 21.03 kg/m.Marland Kitchen Seen by dietician.  I agree with the assessment and plan as outlined below: Nutrition Status: Nutrition Problem: Increased nutrient needs Etiology: hip fracture Signs/Symptoms: estimated needs Interventions: Refer to RD note for recommendations   . Skin Assessment: I have examined the patient's skin and I agree with the wound assessment as performed by the wound care RN as outlined below:   Consultants:  Orthopedics  Procedures:  Left hip hemiarthroplasty   Antimicrobials:  Anti-infectives (From admission, onward)        Start     Dose/Rate Route Frequency Ordered Stop    08/27/21 2200   ceFAZolin (ANCEF) IVPB 2g/100 mL premix  Status:  Discontinued        2 g 200 mL/hr over 30 Minutes Intravenous Every 8 hours 08/27/21 1857 08/27/21 1924    08/27/21 1715   vancomycin (VANCOCIN) powder  Status:  Discontinued            As needed 08/27/21 1724 08/27/21 1738    08/27/21 0915   ceFAZolin (ANCEF) IVPB 2g/100 mL premix        2 g 200 mL/hr over 30 Minutes Intravenous On call to O.R. 08/27/21 7035 08/27/21 1611    08/27/21 0915   cefTRIAXone (ROCEPHIN) 1 g in sodium chloride 0.9 % 100 mL IVPB        1 g 200 mL/hr over 30 Minutes Intravenous Every 24 hours 08/27/21 0822 08/29/21 1000             Status is: Inpatient  Patient requires at least 2 midnights for further evaluation and treatment of present condition.    Disposition: Plan to discharge to SNF once delirium is controlled.     Objective: Vitals:   08/31/21 2019 08/31/21 2357 09/01/21 0457 09/01/21 0728  BP: 137/61 137/66 (!) 167/78 (!) 156/69  Pulse: 67 66 79 79  Resp: 20 18 18 16   Temp: 98.6 F (37 C) 98.5 F (36.9 C)  98.6 F (37 C) 99.1 F (37.3 C)  TempSrc: Oral Oral Oral Oral  SpO2: 98% 95% 97% 96%  Weight:      Height:        Intake/Output Summary (Last 24 hours) at 09/01/2021 1152 Last data filed at 08/31/2021 2036 Gross per 24 hour  Intake 360 ml  Output 1 ml  Net 359 ml   Filed Weights   08/26/21 1519  Weight: 52.2 kg    Exam:  General: 88 y.o. year-old female well developed well nourished in no acute distress.  Alert and confused. Cardiovascular: Regular rate and rhythm with no rubs or gallops.  No thyromegaly or JVD noted.   Respiratory: Clear to auscultation with no wheezes or rales.  Poor inspiratory effort. Abdomen: Soft nontender nondistended with normal bowel sounds x4 quadrants. Musculoskeletal: No lower extremity edema. 2/4 pulses in all 4 extremities. Skin: No ulcerative lesions noted or rashes, Psychiatry: Mood is appropriate for condition and setting Neuro: Moves all 4 extremities.  Nonfocal exam.   Data Reviewed: CBC:  Recent Labs  Lab 08/26/21 1630 08/27/21 0415 08/28/21 0253 08/30/21 0848  WBC 10.3 10.5 9.6 8.6  NEUTROABS 8.8*  --  7.7  --   HGB 12.6 12.2 12.2 10.9*  HCT 38.6 36.5 36.4 32.0*  MCV 91.5 90.1 89.7 89.4  PLT 184 163 174 867   Basic Metabolic Panel: Recent Labs  Lab 08/26/21 1630 08/27/21 0415 08/30/21 0848 08/31/21 0301  NA 140 138 136 141  K 4.0 3.7 3.1* 4.6  CL 105 103 99 107  CO2 26 28 28 27   GLUCOSE 105* 107* 142* 122*  BUN 21 18 20 20   CREATININE 0.92 0.86 0.84 0.79  CALCIUM 9.2 8.7* 8.2* 8.5*   GFR: Estimated Creatinine Clearance: 32.5 mL/min (by C-G formula based on SCr of 0.79 mg/dL). Liver Function Tests: Recent Labs  Lab 08/26/21 1630  AST 27  ALT 27  ALKPHOS 58  BILITOT 1.1  PROT 6.0*  ALBUMIN 3.5   No results for input(s): LIPASE, AMYLASE in the last 168 hours. No results for input(s): AMMONIA in the last 168 hours. Coagulation Profile: Recent Labs  Lab 08/26/21 1630  INR 1.1   Cardiac Enzymes: Recent  Labs  Lab 08/27/21 0415 08/31/21 0301  CKTOTAL 663* 268*   BNP (last 3 results) No results for input(s): PROBNP in the last 8760 hours. HbA1C: No results for input(s): HGBA1C in the last 72 hours. CBG: No results for input(s): GLUCAP in the last 168 hours. Lipid Profile: No results for input(s): CHOL, HDL, LDLCALC, TRIG, CHOLHDL, LDLDIRECT in the last 72 hours. Thyroid Function Tests: No results for input(s): TSH, T4TOTAL, FREET4, T3FREE, THYROIDAB in the last 72 hours. Anemia Panel: No results for input(s): VITAMINB12, FOLATE, FERRITIN, TIBC, IRON, RETICCTPCT in the last 72 hours. Urine analysis:    Component Value Date/Time   COLORURINE YELLOW 08/27/2021 0623   APPEARANCEUR CLOUDY (A) 08/27/2021 0623   LABSPEC 1.020 08/27/2021 0623   PHURINE 6.0 08/27/2021 0623   GLUCOSEU NEGATIVE 08/27/2021 0623   HGBUR NEGATIVE 08/27/2021 0623   BILIRUBINUR NEGATIVE 08/27/2021 0623   KETONESUR NEGATIVE 08/27/2021 0623   PROTEINUR NEGATIVE 08/27/2021 0623   UROBILINOGEN 0.2 03/04/2010 1000   NITRITE POSITIVE (A) 08/27/2021 0623   LEUKOCYTESUR TRACE (A) 08/27/2021 0623   Sepsis Labs: @LABRCNTIP (procalcitonin:4,lacticidven:4)  ) Recent Results (from the past 240 hour(s))  Resp Panel by RT-PCR (Flu A&B, Covid) Nasopharyngeal Swab     Status: None   Collection Time: 08/26/21  3:02 AM   Specimen: Nasopharyngeal Swab; Nasopharyngeal(NP) swabs in vial transport medium  Result Value Ref Range Status   SARS Coronavirus 2 by RT PCR NEGATIVE NEGATIVE Final    Comment: (NOTE) SARS-CoV-2 target nucleic acids are NOT DETECTED.  The SARS-CoV-2 RNA is generally detectable in upper respiratory specimens during the acute phase of infection. The lowest concentration of SARS-CoV-2 viral copies this assay can detect is 138 copies/mL. A negative result does not preclude SARS-Cov-2 infection and should not be used as the sole basis for treatment or other patient management decisions. A negative result  may occur with  improper specimen collection/handling, submission of specimen other than nasopharyngeal swab, presence of viral mutation(s) within the areas targeted by this assay, and inadequate number of viral copies(<138 copies/mL). A negative result must be combined with clinical observations, patient history, and epidemiological information. The expected result is Negative.  Fact Sheet for Patients:  EntrepreneurPulse.com.au  Fact Sheet for Healthcare Providers:  IncredibleEmployment.be  This test is no t yet approved or cleared by the [place]  States FDA and  has been authorized for detection and/or diagnosis of SARS-CoV-2 by FDA under an Emergency Use Authorization (EUA). This EUA will remain  in effect (meaning this test can be used) for the duration of the COVID-19 declaration under Section 564(b)(1) of the Act, 21 U.S.C.section 360bbb-3(b)(1), unless the authorization is terminated  or revoked sooner.       Influenza A by PCR NEGATIVE NEGATIVE Final   Influenza B by PCR NEGATIVE NEGATIVE Final    Comment: (NOTE) The Xpert Xpress SARS-CoV-2/FLU/RSV plus assay is intended as an aid in the diagnosis of influenza from Nasopharyngeal swab specimens and should not be used as a sole basis for treatment. Nasal washings and aspirates are unacceptable for Xpert Xpress SARS-CoV-2/FLU/RSV testing.  Fact Sheet for Patients: EntrepreneurPulse.com.au  Fact Sheet for Healthcare Providers: IncredibleEmployment.be  This test is not yet approved or cleared by the [place] FDA and has been authorized for detection and/or diagnosis of SARS-CoV-2 by FDA under an Emergency Use Authorization (EUA). This EUA will remain in effect (meaning this test can be used) for the duration of the COVID-19 declaration under Section 564(b)(1) of the Act, 21 U.S.C. section 360bbb-3(b)(1), unless the authorization is terminated  or revoked.  Performed at [facility], [place] [address] [place], [place] [address]   Urine Culture     Status: Abnormal   Collection Time: 08/27/21  9:05 AM   Specimen: Urine, Clean Catch  Result Value Ref Range Status   Specimen Description URINE, CLEAN CATCH  Final   Special Requests   Final    NONE Performed at [facility], [place] [address] [place], [place] [address]    Culture >=100,000 COLONIES/mL ESCHERICHIA COLI (A)  Final   Report Status 08/29/2021 FINAL  Final   Organism ID, Bacteria ESCHERICHIA COLI (A)  Final      Susceptibility   Escherichia coli - MIC*    AMPICILLIN <=2 SENSITIVE Sensitive     CEFAZOLIN <=4 SENSITIVE Sensitive     CEFEPIME <=0.12 SENSITIVE Sensitive     CEFTRIAXONE <=0.25 SENSITIVE Sensitive     CIPROFLOXACIN <=0.25 SENSITIVE Sensitive     GENTAMICIN <=1 SENSITIVE Sensitive     IMIPENEM <=0.25 SENSITIVE Sensitive     NITROFURANTOIN <=16 SENSITIVE Sensitive     TRIMETH/SULFA <=20 SENSITIVE Sensitive     AMPICILLIN/SULBACTAM <=2 SENSITIVE Sensitive     PIP/TAZO <=4 SENSITIVE Sensitive     * >=100,000 COLONIES/mL ESCHERICHIA COLI  Surgical PCR Screen     Status: None   Collection Time: 08/27/21  9:12 AM   Specimen: Urine, Catheterized; Nasal Swab  Result Value Ref Range Status   MRSA, PCR NEGATIVE NEGATIVE Final   Staphylococcus aureus NEGATIVE NEGATIVE Final    Comment: (NOTE) The Xpert SA Assay (FDA approved for NASAL specimens in patients 69 years of age and older), is one component of a comprehensive surveillance program. It is not intended to diagnose infection nor to guide or monitor treatment. Performed at [facility], [place] [address] [place], [place] [address]       Studies: No results found.  Scheduled Meds:  aspirin  81 mg Oral Daily   atorvastatin  10 mg Oral Daily   Chlorhexidine Gluconate Cloth  6 each Topical Daily   cilostazol  50 mg Oral BID   docusate sodium  100 mg Oral BID   enoxaparin (LOVENOX)  injection  30 mg Subcutaneous Q24H   feeding supplement  237 mL Oral BID  BM   levothyroxine  50 mcg Oral Q0600   metoprolol succinate  37.5 mg Oral Daily    Continuous Infusions:  methocarbamol (ROBAXIN) IV Stopped (08/27/21 0506)     LOS: 6 days     Kayleen Memos, MD [facility] Pager [phone]  If 7PM-7AM, please contact night-coverage [email or web address] Password [facility] 09/01/2021, 11:52 AM

## 2021-09-02 LAB — SARS CORONAVIRUS 2 (TAT 6-24 HRS): SARS Coronavirus 2: NEGATIVE

## 2021-09-02 MED ORDER — ENSURE ENLIVE PO LIQD
237.0000 mL | Freq: Three times a day (TID) | ORAL | Status: DC
  Administered 2021-09-02 – 2021-09-03 (×3): 237 mL via ORAL

## 2021-09-02 NOTE — Progress Notes (Signed)
Patient refused morning meds and breakfast. 2 attempts made by nurse. Family and MD made aware.

## 2021-09-02 NOTE — Progress Notes (Signed)
Mobility Specialist Progress Note:   09/02/21 1045  Mobility  Bed Position Chair  Activity Ambulated with assistance in room  Level of Assistance Minimal assist, patient does 75% or more  Assistive Device Front wheel walker  Distance Ambulated (ft) 6 ft  Activity Response Tolerated fair  $Mobility charge 1 Mobility   Pt received in bed and required max encouragement to transfer to chair. MinA for bed mobility and to stand EOB. Pt had a accident in bed and required peri-care. MinA to take steps to recliner. Left in recliner with call bell in reach and all needs met.   [facility] Public librarian Phone [phone]

## 2021-09-02 NOTE — Progress Notes (Signed)
Nutrition Follow-up  DOCUMENTATION CODES:  Not applicable  INTERVENTION:  Continue regular diet, encourage PO intake Consider checking vitamin d levels to determine if supplementation is needed Increase Ensure Enlive po to TID, each supplement provides 350 kcal and 20 grams of protein.  NUTRITION DIAGNOSIS:  Increased nutrient needs related to hip fracture as evidenced by estimated needs. - ongoing  Additional Dx 2/9: Severe protein-calorie malnutrition in the context of social/environmental circumstances related to inadequate energy intake as evidenced by severe muscle and fat deficits  GOAL:  Patient will meet greater than or equal to 90% of their needs - supplements in place  MONITOR:  PO intake, Diet advancement  REASON FOR ASSESSMENT:  Consult Hip fracture protocol  ASSESSMENT:  88 y.o. year-old female with history of CKD3, HLD, Hypothyroidism, HTN and PAD presented to ED with pain after a fall at home.   2/3 - Op, Left hip hemiarthroplasty  Pt resting in bedside chair at the time of assessment working on breakfast. Family members at bedside assist with nutrition hx as pt is more confused than her baseline. Reports that pt has never had a large appetite and has always been thin. Thinks intake may be worse than at baseline though.  States they have been ordering meals for pt, but when they are not present, pt may be receiving food she does not prefer. They report that pt does like ensure and drinks them at home. Will increase to TID as pt is quite depleted on exam.   Family reports that at baseline, pt is active and always moving around.   Average Meal Intake: 2/4-2/6: 50% intake x 5 recorded meals  Nutritionally Relevant Medications: Scheduled Meds:  atorvastatin  10 mg Oral Daily   docusate sodium  100 mg Oral BID   Ensure Enlive  237 mL Oral BID BM   PRN Meds: ondansetron  Labs Reviewed  NUTRITION - FOCUSED PHYSICAL EXAM: Flowsheet Row Most Recent Value   Orbital Region Moderate depletion  Upper Arm Region Severe depletion  Thoracic and Lumbar Region Severe depletion  Buccal Region Moderate depletion  Temple Region Severe depletion  Clavicle Bone Region Severe depletion  Clavicle and Acromion Bone Region Severe depletion  Scapular Bone Region Moderate depletion  Dorsal Hand Severe depletion  Patellar Region Severe depletion  Anterior Thigh Region Severe depletion  Posterior Calf Region Moderate depletion  Edema (RD Assessment) None  Hair Reviewed  Eyes Reviewed  Mouth Reviewed  Skin Reviewed  Nails Reviewed   Diet Order:   Diet Order             Diet regular Room service appropriate? Yes; Fluid consistency: Thin  Diet effective now                   EDUCATION NEEDS:  No education needs have been identified at this time  Skin:  Skin Assessment: Reviewed RN Assessment  Last BM:  2/7  Height:  Ht Readings from Last 1 Encounters:  08/26/21 5\' 2"  (1.575 m)    Weight:  Wt Readings from Last 1 Encounters:  08/26/21 52.2 kg    Ideal Body Weight:  50 kg  BMI:  Body mass index is 21.03 kg/m.  Estimated Nutritional Needs:  Kcal:  1300-1500 kcal/d Protein:  65-75 g/d Fluid:  >1545mL/d   Ranell Patrick, RD, LDN Clinical Dietitian RD pager # available in AMION  After hours/weekend pager # available in [facility]

## 2021-09-02 NOTE — Progress Notes (Addendum)
Physical Therapy Treatment Patient Details Name: Katelyn Morris MRN: [id number] DOB: [birth year] Today's Date: 09/02/2021   History of Present Illness 88 yo female wtih onset of fall at home was admitted on 08/26/21 for L hip hemiarthroplasty for femoral neck fracture.  Pt is referred to PT to assess her ability to move and go home vs rehab.  PMHx:  heart murmur, hypothyroidism, pacer for SSS, bradycardia, HTN, hypothyroidism    PT Comments    Pt received OOB in recliner and agreeable to session. Pt becoming agitated throughout with confusion regarding clearance from MD for mobility, multiple attempts to explain that pt was cleared for mobility and gently motivate to have pt come to sitting upright at edge of chair and ultimately standing to transfer back to bed without success. Pt with x2 bouts of sitting upright on edge of chair but unable to come to full standing. Pt declining further mobility and requesting to stay in chair. Pt positioned to comfort. Pt with fair tolerance to LE exercises. Pt continues to benefit from skilled PT services to progress toward functional mobility goals.    Recommendations for follow up therapy are one component of a multi-disciplinary discharge planning process, led by the attending physician.  Recommendations may be updated based on patient status, additional functional criteria and insurance authorization.  Follow Up Recommendations  Skilled nursing-short term rehab (<3 hours/day)     Assistance Recommended at Discharge Frequent or constant Supervision/Assistance  Patient can return home with the following A lot of help with walking and/or transfers;A lot of help with bathing/dressing/bathroom;Assistance with cooking/housework;Help with stairs or ramp for entrance;Assist for transportation   Equipment Recommendations  Rolling walker (2 wheels)    Recommendations for Other Services       Precautions / Restrictions Precautions Precautions:  Fall Precaution Comments: no formal precautions but protective for C1 change Restrictions Weight Bearing Restrictions: Yes LLE Weight Bearing: Weight bearing as tolerated     Mobility  Bed Mobility       General bed mobility comments: OOB in recliner on arrival    Transfers Overall transfer level: Needs assistance Equipment used: Rolling walker (2 wheels) Transfers: Sit to/from Stand Sit to Stand: Max assist     General transfer comment: able to spontaneously come to upright sitting with min guard x1 but abruptly leaned back with c/o pain, max assist to come to upright sitting again on edge of chair, pt unable to coordinate to come to standing with confusion on WB status even with PTA confirming. pt declined to stand    Ambulation/Gait               General Gait Details: unable   Stairs             Wheelchair Mobility    Modified Rankin (Stroke Patients Only)       Balance                                            Cognition Arousal/Alertness: Awake/alert, Lethargic Behavior During Therapy: WFL for tasks assessed/performed Overall Cognitive Status: Difficult to assess           General Comments: severely HOH and difficult to assess cognition, difficulty following one step commands and appropriately responding to questions        Exercises General Exercises - Lower Extremity Ankle Circles/Pumps: AROM, Both, 10 reps, Seated Long Arc  Quad: AROM, AAROM, Left, 10 reps, Seated    General Comments        Pertinent Vitals/Pain Pain Assessment Pain Assessment: Faces Faces Pain Scale: Hurts even more Pain Location: L hip Pain Descriptors / Indicators: Operative site guarding, Grimacing    Home Living                          Prior Function            PT Goals (current goals can now be found in the care plan section) Acute Rehab PT Goals PT Goal Formulation: With patient Time For Goal Achievement:  09/11/21 Potential to Achieve Goals: Good    Frequency    Min 3X/week      PT Plan Current plan remains appropriate    Co-evaluation              AM-PAC PT "6 Clicks" Mobility   Outcome Measure  Help needed turning from your back to your side while in a flat bed without using bedrails?: A Lot Help needed moving from lying on your back to sitting on the side of a flat bed without using bedrails?: A Lot Help needed moving to and from a bed to a chair (including a wheelchair)?: A Lot Help needed standing up from a chair using your arms (e.g., wheelchair or bedside chair)?: Total Help needed to walk in hospital room?: Total Help needed climbing 3-5 steps with a railing? : Total 6 Click Score: 9    End of Session Equipment Utilized During Treatment: Gait belt Activity Tolerance: Patient limited by pain;Patient limited by fatigue;Treatment limited secondary to agitation Patient left: in chair;with call bell/phone within reach;with chair alarm set Nurse Communication: Mobility status; other (RN notified of increased agitation) PT Visit Diagnosis: Unsteadiness on feet (R26.81);Muscle weakness (generalized) (M62.81);Pain;Difficulty in walking, not elsewhere classified (R26.2) Pain - Right/Left: Left Pain - part of body: Hip     Time: 6945-0388 PT Time Calculation (min) (ACUTE ONLY): 20 min  Charges:  $Therapeutic Activity: 8-22 mins                     Kreig Parson R. PTA Acute Rehabilitation Services Office: [place] 09/02/2021, 4:31 PM

## 2021-09-02 NOTE — TOC Progression Note (Addendum)
Transition of Care [facility]) - Progression Note    Patient Details  Name: Katelyn Morris MRN: [id number] Date of Birth: 1923-03-18  Transition of Care [facility]) CM/SW [place], [place] Phone Number: 09/02/2021, 1:39 PM  Clinical Narrative:     CSW was notified that pt may be held another day before DC. Pt has a negative covid test, and a DNR on the chart. Authorization is approved at this time. TOC will continue to follow for DC needs.  Expected Discharge Plan: [place] Barriers to Discharge: Continued Medical Work up  Expected Discharge Plan and Services Expected Discharge Plan: [place] In-house Referral: Clinical Social Work                                             Social Determinants of Health (SDOH) Interventions    Readmission Risk Interventions No flowsheet data found.

## 2021-09-02 NOTE — Progress Notes (Signed)
PROGRESS NOTE  Katelyn Morris JXB:[id number] DOB: 02/20/1923 DOA: 08/26/2021 PCP: Lawerance Cruel, MD  HPI/Recap of past 24 hours: Katelyn Morris is a 88 y.o. female with medical history significant of HTN, hypothyroidism, SSS s/p PPM placement, CKD3A, HLD, PAD, chronic anxiety on Xanax for years, who presented to [facility] ED from home after a mechanical fall while working in her kitchen.  Upon arrival to ED, work-up revealed left femoral neck fracture.  She is status post repair, left hip hemiarthroplasty on 08/27/2021 by orthopedic surgery, Dr. Sammuel Hines.  CT cervical: subtle calcification along the anterior inferior aspect of the lateral mass of C1 w/out associated prevertebral soft tissue swelling in the area. Cannot exclude subtle fracture but it could be artifact as well.  Patient with no neck pain.   Hospital course complicated by delirium.  Started on delirium management on 09/01/2021.  09/02/2021: Patient was seen at her bedside this morning, she was somnolent but arousable to voices.  Returned to her room to update her son and daughter-in-law.  She is sitting in the chair and in no acute distress.  It appears that her delirium is improving on Seroquel 12.5 mg twice daily and nonpharmacological delirium management.  We will monitor today and plan to discharge tomorrow 09/03/21 to SNF if her insurance authorizes.  Assessment/Plan: Principal Problem:   Closed left hip fracture, initial encounter [facility]) Active Problems:   Hypothyroidism   Essential hypertension   SSS (sick sinus syndrome) s/p PPM   PAD (peripheral artery disease) (HCC)   Hypercholesterolemia   Anxiety   Abnormal CT scan, cervical spine   CKD (chronic kidney disease) stage 3, GFR 30-59 ml/min (HCC)  Closed left hip fracture, initial encounter ([place])- (present on admission):  S/p left hip hemiarthroplasty on 08/27/2021 by orthopedic surgery, Dr. Sammuel Hines.  Continue pain management and bowel regimen.  Pharmacological DVT  prophylaxis per orthopedic surgery. Plan to discharge to SNF possibly on 09/03/2021 if her insurance authorizes.   Acute blood loss anemia/postoperative anemia:  Hemoglobin dropped to 10.9.   No overt bleeding.  Delirium is improving, acute metabolic encephalopathy in the setting of acute illness, recent surgery Continue delirium precautions started 09/01/2021 Continue out of bed to chair during the day, reorient frequently Continue to regulate sleep and wake cycle Continue Seroquel 12.5 mg twice daily Continue melatonin nightly Last QTc on 08/31/2021 was normal.   Treated E. coli UTI:  Urine culture grew E. coli which is pansensitive.   Completed course of Rocephin.   Abnormal CT scan, cervical spine- (present on admission) Questionable C1 subtle fracture. Could not rule out due to artifact -patient denies hitting head and denies neck pain -continue to monitor    Hypercholesterolemia- (present on admission) Cholesterol well controlled, continue lipitor    Essential hypertension- (present on admission) BP stable. Continue home Toprol-XL 37.5 mg daily.   Hold off home HCTZ to avoid electrolyte abnormalities.  CKD (chronic kidney disease) stage 2 Baseline creatinine appears to be 0.7 with GFR greater than 60. Currently at baseline.  PAD (peripheral artery disease) ([place])- (present on admission) Continue aspirin, statin and pletal.     SSS (sick sinus syndrome) s/p PPM- (present on admission) PPM in 2013, regular interrogation checks, followed by cardiology  Denies any palpitations/chest pain prior to fall    Hypothyroidism- (present on admission) Continue home synthroid TSH wnl 01/2021   Chronic anxiety- (present on admission) Per son and daughter-in-law at bedside patient has been on Xanax for years and takes it regularly.  Resume home Xanax 0.25 mg nightly to avoid benzodiazepine withdrawal   Constipation:  Resolved.  Continue Colace.  Goals of care Palliative care team  consulted to assist with establishing goals of care. Patient is DNR. Refusing her medications   Resolved post repletion: Hypokalemia:       DVT prophylaxis: enoxaparin (LOVENOX) injection 30 mg Start: 08/28/21 1200 SCDs Start: 08/26/21 1851   Code Status: DNR  Family Communication: Updated the patient's son and daughter-in-law outside of patient's room.    Estimated body mass index is 21.03 kg/m as calculated from the following:   Height as of this encounter: 5\' 2"  (1.575 m).   Weight as of this encounter: 52.2 kg.   Nutritional Assessment: Body mass index is 21.03 kg/m.Marland Kitchen Seen by dietician.  I agree with the assessment and plan as outlined below: Nutrition Status: Nutrition Problem: Increased nutrient needs Etiology: hip fracture Signs/Symptoms: estimated needs Interventions: Refer to RD note for recommendations   . Skin Assessment: I have examined the patient's skin and I agree with the wound assessment as performed by the wound care RN as outlined below:   Consultants:  Orthopedics  Procedures:  Left hip hemiarthroplasty   Antimicrobials:  Anti-infectives (From admission, onward)        Start     Dose/Rate Route Frequency Ordered Stop    08/27/21 2200   ceFAZolin (ANCEF) IVPB 2g/100 mL premix  Status:  Discontinued        2 g 200 mL/hr over 30 Minutes Intravenous Every 8 hours 08/27/21 1857 08/27/21 1924    08/27/21 1715   vancomycin (VANCOCIN) powder  Status:  Discontinued            As needed 08/27/21 1724 08/27/21 1738    08/27/21 0915   ceFAZolin (ANCEF) IVPB 2g/100 mL premix        2 g 200 mL/hr over 30 Minutes Intravenous On call to O.R. 08/27/21 8676 08/27/21 1611    08/27/21 0915   cefTRIAXone (ROCEPHIN) 1 g in sodium chloride 0.9 % 100 mL IVPB        1 g 200 mL/hr over 30 Minutes Intravenous Every 24 hours 08/27/21 0822 08/29/21 1000             Status is: Inpatient  Patient requires at least 2 midnights for further evaluation and treatment  of present condition.    Disposition: Plan to discharge to SNF possibly 09/03/21 once her insurance authorizes.     Objective: Vitals:   09/01/21 0728 09/01/21 1602 09/01/21 2017 09/02/21 0355  BP: (!) 156/69 (!) 129/59 (!) 168/70 (!) 141/67  Pulse: 79 60 87 77  Resp: 16 16 19 18   Temp: 99.1 F (37.3 C) 98.4 F (36.9 C) 98 F (36.7 C) 97.7 F (36.5 C)  TempSrc: Oral Oral Axillary Axillary  SpO2: 96% 99% 95% 97%  Weight:      Height:        Intake/Output Summary (Last 24 hours) at 09/02/2021 1405 Last data filed at 09/02/2021 1030 Gross per 24 hour  Intake 480 ml  Output 400 ml  Net 80 ml   Filed Weights   08/26/21 1519  Weight: 52.2 kg    Exam:  General: 88 y.o. year-old female frail-appearing in no acute distress.  She is alert and interacts minimally.   Cardiovascular: Regular rate and rhythm no rubs or gallops.Marland Kitchen   Respiratory: Clear to auscultation no wheezes or rales.   Abdomen: Soft nontender normal bowel sounds present.   Musculoskeletal:  No lower extremity edema bilaterally.   Psychiatry: Mood is appropriate for condition and setting. Neuro: Nonfocal exam.   Data Reviewed: CBC: Recent Labs  Lab 08/26/21 1630 08/27/21 0415 08/28/21 0253 08/30/21 0848  WBC 10.3 10.5 9.6 8.6  NEUTROABS 8.8*  --  7.7  --   HGB 12.6 12.2 12.2 10.9*  HCT 38.6 36.5 36.4 32.0*  MCV 91.5 90.1 89.7 89.4  PLT 184 163 174 329   Basic Metabolic Panel: Recent Labs  Lab 08/26/21 1630 08/27/21 0415 08/30/21 0848 08/31/21 0301  NA 140 138 136 141  K 4.0 3.7 3.1* 4.6  CL 105 103 99 107  CO2 26 28 28 27   GLUCOSE 105* 107* 142* 122*  BUN 21 18 20 20   CREATININE 0.92 0.86 0.84 0.79  CALCIUM 9.2 8.7* 8.2* 8.5*   GFR: Estimated Creatinine Clearance: 32.5 mL/min (by C-G formula based on SCr of 0.79 mg/dL). Liver Function Tests: Recent Labs  Lab 08/26/21 1630  AST 27  ALT 27  ALKPHOS 58  BILITOT 1.1  PROT 6.0*  ALBUMIN 3.5   No results for input(s): LIPASE,  AMYLASE in the last 168 hours. No results for input(s): AMMONIA in the last 168 hours. Coagulation Profile: Recent Labs  Lab 08/26/21 1630  INR 1.1   Cardiac Enzymes: Recent Labs  Lab 08/27/21 0415 08/31/21 0301  CKTOTAL 663* 268*   BNP (last 3 results) No results for input(s): PROBNP in the last 8760 hours. HbA1C: No results for input(s): HGBA1C in the last 72 hours. CBG: No results for input(s): GLUCAP in the last 168 hours. Lipid Profile: No results for input(s): CHOL, HDL, LDLCALC, TRIG, CHOLHDL, LDLDIRECT in the last 72 hours. Thyroid Function Tests: No results for input(s): TSH, T4TOTAL, FREET4, T3FREE, THYROIDAB in the last 72 hours. Anemia Panel: No results for input(s): VITAMINB12, FOLATE, FERRITIN, TIBC, IRON, RETICCTPCT in the last 72 hours. Urine analysis:    Component Value Date/Time   COLORURINE YELLOW 08/27/2021 0623   APPEARANCEUR CLOUDY (A) 08/27/2021 0623   LABSPEC 1.020 08/27/2021 0623   PHURINE 6.0 08/27/2021 0623   GLUCOSEU NEGATIVE 08/27/2021 0623   HGBUR NEGATIVE 08/27/2021 0623   BILIRUBINUR NEGATIVE 08/27/2021 0623   KETONESUR NEGATIVE 08/27/2021 0623   PROTEINUR NEGATIVE 08/27/2021 0623   UROBILINOGEN 0.2 03/04/2010 1000   NITRITE POSITIVE (A) 08/27/2021 0623   LEUKOCYTESUR TRACE (A) 08/27/2021 0623   Sepsis Labs: @LABRCNTIP (procalcitonin:4,lacticidven:4)  ) Recent Results (from the past 240 hour(s))  Resp Panel by RT-PCR (Flu A&B, Covid) Nasopharyngeal Swab     Status: None   Collection Time: 08/26/21  3:02 AM   Specimen: Nasopharyngeal Swab; Nasopharyngeal(NP) swabs in vial transport medium  Result Value Ref Range Status   SARS Coronavirus 2 by RT PCR NEGATIVE NEGATIVE Final    Comment: (NOTE) SARS-CoV-2 target nucleic acids are NOT DETECTED.  The SARS-CoV-2 RNA is generally detectable in upper respiratory specimens during the acute phase of infection. The lowest concentration of SARS-CoV-2 viral copies this assay can detect  is 138 copies/mL. A negative result does not preclude SARS-Cov-2 infection and should not be used as the sole basis for treatment or other patient management decisions. A negative result may occur with  improper specimen collection/handling, submission of specimen other than nasopharyngeal swab, presence of viral mutation(s) within the areas targeted by this assay, and inadequate number of viral copies(<138 copies/mL). A negative result must be combined with clinical observations, patient history, and epidemiological information. The expected result is Negative.  Fact Sheet for Patients:  EntrepreneurPulse.com.au  Fact Sheet for Healthcare Providers:  IncredibleEmployment.be  This test is no t yet approved or cleared by the [place] FDA and  has been authorized for detection and/or diagnosis of SARS-CoV-2 by FDA under an Emergency Use Authorization (EUA). This EUA will remain  in effect (meaning this test can be used) for the duration of the COVID-19 declaration under Section 564(b)(1) of the Act, 21 U.S.C.section 360bbb-3(b)(1), unless the authorization is terminated  or revoked sooner.       Influenza A by PCR NEGATIVE NEGATIVE Final   Influenza B by PCR NEGATIVE NEGATIVE Final    Comment: (NOTE) The Xpert Xpress SARS-CoV-2/FLU/RSV plus assay is intended as an aid in the diagnosis of influenza from Nasopharyngeal swab specimens and should not be used as a sole basis for treatment. Nasal washings and aspirates are unacceptable for Xpert Xpress SARS-CoV-2/FLU/RSV testing.  Fact Sheet for Patients: EntrepreneurPulse.com.au  Fact Sheet for Healthcare Providers: IncredibleEmployment.be  This test is not yet approved or cleared by the [place] FDA and has been authorized for detection and/or diagnosis of SARS-CoV-2 by FDA under an Emergency Use Authorization (EUA). This EUA will remain in effect  (meaning this test can be used) for the duration of the COVID-19 declaration under Section 564(b)(1) of the Act, 21 U.S.C. section 360bbb-3(b)(1), unless the authorization is terminated or revoked.  Performed at [facility], [place] [address], [place], [place] [address]   Urine Culture     Status: Abnormal   Collection Time: 08/27/21  9:05 AM   Specimen: Urine, Clean Catch  Result Value Ref Range Status   Specimen Description URINE, CLEAN CATCH  Final   Special Requests   Final    NONE Performed at [facility], [place] [address], [place], [place] [address]    Culture >=100,000 COLONIES/mL ESCHERICHIA COLI (A)  Final   Report Status 08/29/2021 FINAL  Final   Organism ID, Bacteria ESCHERICHIA COLI (A)  Final      Susceptibility   Escherichia coli - MIC*    AMPICILLIN <=2 SENSITIVE Sensitive     CEFAZOLIN <=4 SENSITIVE Sensitive     CEFEPIME <=0.12 SENSITIVE Sensitive     CEFTRIAXONE <=0.25 SENSITIVE Sensitive     CIPROFLOXACIN <=0.25 SENSITIVE Sensitive     GENTAMICIN <=1 SENSITIVE Sensitive     IMIPENEM <=0.25 SENSITIVE Sensitive     NITROFURANTOIN <=16 SENSITIVE Sensitive     TRIMETH/SULFA <=20 SENSITIVE Sensitive     AMPICILLIN/SULBACTAM <=2 SENSITIVE Sensitive     PIP/TAZO <=4 SENSITIVE Sensitive     * >=100,000 COLONIES/mL ESCHERICHIA COLI  Surgical PCR Screen     Status: None   Collection Time: 08/27/21  9:12 AM   Specimen: Urine, Catheterized; Nasal Swab  Result Value Ref Range Status   MRSA, PCR NEGATIVE NEGATIVE Final   Staphylococcus aureus NEGATIVE NEGATIVE Final    Comment: (NOTE) The Xpert SA Assay (FDA approved for NASAL specimens in patients 13 years of age and older), is one component of a comprehensive surveillance program. It is not intended to diagnose infection nor to guide or monitor treatment. Performed at [facility], [place] [address] [place], [place] [address]   SARS CORONAVIRUS 2 (TAT 6-24 HRS) Nasopharyngeal Nasopharyngeal Swab      Status: None   Collection Time: 09/01/21 10:51 PM   Specimen: Nasopharyngeal Swab  Result Value Ref Range Status   SARS Coronavirus 2 NEGATIVE NEGATIVE Final    Comment: (NOTE) SARS-CoV-2 target nucleic acids  are NOT DETECTED.  The SARS-CoV-2 RNA is generally detectable in upper and lower respiratory specimens during the acute phase of infection. Negative results do not preclude SARS-CoV-2 infection, do not rule out co-infections with other pathogens, and should not be used as the sole basis for treatment or other patient management decisions. Negative results must be combined with clinical observations, patient history, and epidemiological information. The expected result is Negative.  Fact Sheet for Patients: SugarRoll.be  Fact Sheet for Healthcare Providers: https://www.woods-mathews.com/  This test is not yet approved or cleared by the [place] FDA and  has been authorized for detection and/or diagnosis of SARS-CoV-2 by FDA under an Emergency Use Authorization (EUA). This EUA will remain  in effect (meaning this test can be used) for the duration of the COVID-19 declaration under Se ction 564(b)(1) of the Act, 21 U.S.C. section 360bbb-3(b)(1), unless the authorization is terminated or revoked sooner.  Performed at [facility], [place] [address], [place], [place] [address]       Studies: No results found.  Scheduled Meds:  ALPRAZolam  0.125 mg Oral QHS   aspirin  81 mg Oral Daily   atorvastatin  10 mg Oral Daily   Chlorhexidine Gluconate Cloth  6 each Topical Daily   cilostazol  50 mg Oral BID   docusate sodium  100 mg Oral BID   enoxaparin (LOVENOX) injection  30 mg Subcutaneous Q24H   feeding supplement  237 mL Oral BID BM   levothyroxine  50 mcg Oral Q0600   melatonin  3 mg Oral QHS   metoprolol succinate  37.5 mg Oral Daily   QUEtiapine  12.5 mg Oral BID    Continuous Infusions:  methocarbamol (ROBAXIN) IV  Stopped (08/27/21 0506)     LOS: 7 days     Kayleen Memos, MD [facility] Pager [phone]  If 7PM-7AM, please contact night-coverage [email or web address] Password [email or web address] 09/02/2021, 2:05 PM

## 2021-09-03 DIAGNOSIS — I129 Hypertensive chronic kidney disease with stage 1 through stage 4 chronic kidney disease, or unspecified chronic kidney disease: Secondary | ICD-10-CM | POA: Diagnosis not present

## 2021-09-03 DIAGNOSIS — R011 Cardiac murmur, unspecified: Secondary | ICD-10-CM | POA: Diagnosis not present

## 2021-09-03 DIAGNOSIS — S72002A Fracture of unspecified part of neck of left femur, initial encounter for closed fracture: Secondary | ICD-10-CM | POA: Diagnosis not present

## 2021-09-03 DIAGNOSIS — Z7401 Bed confinement status: Secondary | ICD-10-CM | POA: Diagnosis not present

## 2021-09-03 DIAGNOSIS — F419 Anxiety disorder, unspecified: Secondary | ICD-10-CM | POA: Diagnosis not present

## 2021-09-03 DIAGNOSIS — S72002D Fracture of unspecified part of neck of left femur, subsequent encounter for closed fracture with routine healing: Secondary | ICD-10-CM | POA: Diagnosis not present

## 2021-09-03 DIAGNOSIS — E7800 Pure hypercholesterolemia, unspecified: Secondary | ICD-10-CM | POA: Diagnosis not present

## 2021-09-03 DIAGNOSIS — Z950 Presence of cardiac pacemaker: Secondary | ICD-10-CM | POA: Diagnosis not present

## 2021-09-03 DIAGNOSIS — M1611 Unilateral primary osteoarthritis, right hip: Secondary | ICD-10-CM | POA: Diagnosis not present

## 2021-09-03 DIAGNOSIS — Z96642 Presence of left artificial hip joint: Secondary | ICD-10-CM | POA: Diagnosis not present

## 2021-09-03 DIAGNOSIS — I739 Peripheral vascular disease, unspecified: Secondary | ICD-10-CM | POA: Diagnosis not present

## 2021-09-03 DIAGNOSIS — Z471 Aftercare following joint replacement surgery: Secondary | ICD-10-CM | POA: Diagnosis not present

## 2021-09-03 DIAGNOSIS — J351 Hypertrophy of tonsils: Secondary | ICD-10-CM | POA: Diagnosis not present

## 2021-09-03 DIAGNOSIS — E039 Hypothyroidism, unspecified: Secondary | ICD-10-CM | POA: Diagnosis not present

## 2021-09-03 DIAGNOSIS — R410 Disorientation, unspecified: Secondary | ICD-10-CM | POA: Diagnosis not present

## 2021-09-03 DIAGNOSIS — N183 Chronic kidney disease, stage 3 unspecified: Secondary | ICD-10-CM | POA: Diagnosis not present

## 2021-09-03 DIAGNOSIS — I959 Hypotension, unspecified: Secondary | ICD-10-CM | POA: Diagnosis not present

## 2021-09-03 DIAGNOSIS — W19XXXD Unspecified fall, subsequent encounter: Secondary | ICD-10-CM | POA: Diagnosis not present

## 2021-09-03 DIAGNOSIS — I10 Essential (primary) hypertension: Secondary | ICD-10-CM | POA: Diagnosis not present

## 2021-09-03 DIAGNOSIS — I495 Sick sinus syndrome: Secondary | ICD-10-CM | POA: Diagnosis not present

## 2021-09-03 MED ORDER — MELATONIN 3 MG PO TABS: 3.0000 mg | ORAL_TABLET | Freq: Every day | ORAL | 0 refills | Status: AC

## 2021-09-03 MED ORDER — POLYETHYLENE GLYCOL 3350 17 G PO PACK: 17.0000 g | PACK | Freq: Every day | ORAL | 0 refills | Status: AC

## 2021-09-03 MED ORDER — QUETIAPINE FUMARATE 25 MG PO TABS: 12.5000 mg | ORAL_TABLET | Freq: Two times a day (BID) | ORAL | 0 refills | Status: AC

## 2021-09-03 MED ORDER — ALPRAZOLAM 0.25 MG PO TABS: 0.1250 mg | ORAL_TABLET | Freq: Every day | ORAL | 0 refills | Status: AC

## 2021-09-03 MED ORDER — ENOXAPARIN SODIUM 30 MG/0.3ML IJ SOSY: 30.0000 mg | PREFILLED_SYRINGE | INTRAMUSCULAR | 0 refills | Status: AC

## 2021-09-03 MED ORDER — OXYCODONE HCL 5 MG PO TABS: 5.0000 mg | ORAL_TABLET | Freq: Four times a day (QID) | ORAL | 0 refills | Status: AC | PRN

## 2021-09-03 MED ORDER — ENSURE ENLIVE PO LIQD: 237.0000 mL | Freq: Three times a day (TID) | ORAL | 0 refills | Status: AC

## 2021-09-03 NOTE — Discharge Summary (Addendum)
Discharge Summary  Katelyn Morris RJJ:[id number] DOB: Oct 04, 1922  PCP: Lawerance Cruel, MD  Admit date: 08/26/2021 Discharge date: 09/03/2021  Time spent: 35 minutes.  Recommendations for Outpatient Follow-up:  Follow-up with your primary care provider. Follow-up with orthopedic surgery. Take your medications as prescribed. Continue PT OT with assistance and fall precautions. Continue delirium management, both pharmacological and nonpharmacological.  Out of bed to chair during the day, frequent reorientation, regulate sleep and awake cycle, continue melatonin nightly.  Wean off Seroquel as indicated.  This was added as part of her delirium management. May consider palliative care referral after rehab.  Discharge Diagnoses:  Active Hospital Problems   Diagnosis Date Noted   Closed left hip fracture, initial encounter ([place]) 08/26/2021   Anxiety 08/26/2021   Abnormal CT scan, cervical spine 08/26/2021   CKD (chronic kidney disease) stage 3, GFR 30-59 ml/min (H[facility]) 08/26/2021   Hypercholesterolemia 01/03/2020   PAD (peripheral artery disease) ([place]) 01/03/2020   SSS (sick sinus syndrome) s/p PPM 08/06/2014   Essential hypertension 08/30/2010   Hypothyroidism 08/30/2010    Resolved Hospital Problems  No resolved problems to display.    Discharge Condition: Stable  Diet recommendation: Resume previous diet.  Vitals:   09/02/21 2024 09/03/21 0501  BP: (!) 155/98 (!) 143/54  Pulse: 76 68  Resp: 18 17  Temp: 97.8 F (36.6 C) 97.6 F (36.4 C)  SpO2: 97% 97%    History of present illness:  Katelyn Morris is a 88 y.o. female with medical history significant of HTN, hypothyroidism, SSS s/p PPM placement, CKD3A, HLD, PAD, chronic anxiety on Xanax for years, who presented to the ED from home after a mechanical fall while working in her kitchen.  Upon arrival to the ED, work-up revealed left femoral neck fracture.  Status post left hip hemiarthroplasty on 08/27/2021 by  orthopedic surgery, Dr. Sammuel Hines.  CT cervical: subtle calcification along the anterior inferior aspect of the lateral mass of C1 w/out associated prevertebral soft tissue swelling in the area. Cannot exclude subtle fracture but it could be artifact as well.  Patient with no neck pain.    Hospital course complicated by delirium.  Started on delirium management pharmacological and nonpharmacological on 09/01/2021 with improvement of her symptomatology.   09/03/2021: Patient was seen and examined at bedside.  There were no acute events overnight.  She has no new complaints.  Will discharge to SNF.  Hospital Course:  Principal Problem:   Closed left hip fracture, initial encounter [facility]) Active Problems:   Hypothyroidism   Essential hypertension   SSS (sick sinus syndrome) s/p PPM   PAD (peripheral artery disease) (HCC)   Hypercholesterolemia   Anxiety   Abnormal CT scan, cervical spine   CKD (chronic kidney disease) stage 3, GFR 30-59 ml/min (HCC)  Closed left hip fracture, initial encounter ([place])- (present on admission):  S/p left hip hemiarthroplasty on 08/27/2021 by orthopedic surgery, Dr. Sammuel Hines.  Continue pain management and bowel regimen.  Pharmacological DVT prophylaxis per orthopedic surgery, subcu Lovenox 30 mg daily x30 days. Continue PT OT with assistance and fall precautions. Follow-up with orthopedic surgery outpatient.   Acute blood loss anemia/postoperative anemia:  Hemoglobin dropped to 10.9.   No overt bleeding. Follow-up with your PCP.   Delirium is improving, acute metabolic encephalopathy in the setting of acute illness, recent surgery Continue delirium precautions started 09/01/2021 Continue out of bed to chair during the day, reorient frequently Continue to regulate sleep and wake cycle, continue melatonin nightly. Continue Seroquel 12.5  mg twice daily, wean off as tolerated. Follow-up with your PCP.   Treated E. coli UTI:  Urine culture grew E. coli which is  pansensitive.   Completed course of Rocephin.   Abnormal CT scan, cervical spine- (present on admission) Questionable C1 subtle fracture. Could not rule out due to artifact -patient denies hitting head and denies neck pain -continue to monitor    Hypercholesterolemia- (present on admission) Cholesterol well controlled, continue lipitor    Essential hypertension- (present on admission) BP stable. Continue home Toprol-XL 37.5 mg daily.   Hold off home HCTZ to avoid electrolyte abnormalities.   CKD (chronic kidney disease) stage 2 Baseline creatinine appears to be 0.7 with GFR greater than 60. Currently at baseline. Follow-up with your PCP.   PAD (peripheral artery disease) ([place])- (present on admission) Continue aspirin, statin and pletal.     SSS (sick sinus syndrome) s/p PPM- (present on admission) PPM in 2013, regular interrogation checks, followed by cardiology  Denies any palpitations/chest pain prior to fall    Hypothyroidism- (present on admission) Continue home synthroid TSH wnl 01/2021   Chronic anxiety- (present on admission) Per son and daughter-in-law at bedside patient has been on Xanax for years and takes it regularly. Resume home Xanax 0.25 mg nightly to avoid benzodiazepine withdrawal   Constipation:  Resolved. Continue MiraLAX while on opiates.   Goals of care Consider palliative care referral after rehab. Patient is DNR.  Resolved post repletion: Hypokalemia: Serum potassium 4.6 from 3.1.       Family Communication: Frequently updated patient's son and daughter-in-law.   Estimated body mass index is 21.03 kg/m as calculated from the following:   Height as of this encounter: 5\' 2"  (1.575 m).   Weight as of this encounter: 52.2 kg.   Nutritional Assessment: Body mass index is 21.03 kg/m.Marland Kitchen Seen by dietician.  I agree with the assessment and plan as outlined below: Nutrition Status: Nutrition Problem: Increased nutrient needs Etiology: hip  fracture Signs/Symptoms: estimated needs Interventions: Refer to RD note for recommendations   . Skin Assessment: I have examined the patient's skin and I agree with the wound assessment as performed by the wound care RN as outlined below:   Consultants:  Orthopedics   Procedures:  Left hip hemiarthroplasty   Antimicrobials:  Anti-infectives (From admission, onward)       Discharge Exam: BP (!) 143/54 (BP Location: Left Arm)    Pulse 68    Temp 97.6 F (36.4 C) (Axillary)    Resp 17    Ht 5\' 2"  (1.575 m)    Wt 52.2 kg    SpO2 97%    BMI 21.03 kg/m  General: 88 y.o. year-old female well developed well nourished in no acute distress.  Alert and interactive. Cardiovascular: Regular rate and rhythm with no rubs or gallops.  No thyromegaly or JVD noted.   Respiratory: Clear to auscultation with no wheezes or rales.  Poor inspiratory effort. Abdomen: Soft nontender nondistended with normal bowel sounds x4 quadrants. Musculoskeletal: No lower extremity edema bilaterally. Psychiatry: Mood is appropriate for condition and setting  Discharge Instructions You were cared for by a hospitalist during your hospital stay. If you have any questions about your discharge medications or the care you received while you were in the hospital after you are discharged, you can call the unit and asked to speak with the hospitalist on call if the hospitalist that took care of you is not available. Once you are discharged, your primary care physician will  handle any further medical issues. Please note that NO REFILLS for any discharge medications will be authorized once you are discharged, as it is imperative that you return to your primary care physician (or establish a relationship with a primary care physician if you do not have one) for your aftercare needs so that they can reassess your need for medications and monitor your lab values.   Allergies as of 09/03/2021       Reactions   Trazodone Hcl     Other reaction(s): more restless,  drawing in back of neck   Sulfa Antibiotics Swelling   Sulfonamide Derivatives Swelling        Medication List     STOP taking these medications    hydrochlorothiazide 12.5 MG tablet Commonly known as: HYDRODIURIL       TAKE these medications    ALPRAZolam 0.25 MG tablet Commonly known as: XANAX Take 0.5 tablets (0.125 mg total) by mouth at bedtime for 6 days.   aspirin 81 MG tablet Take 81 mg by mouth daily.   atorvastatin 10 MG tablet Commonly known as: LIPITOR TAKE 1 TABLET EVERY DAY What changed: when to take this   cilostazol 50 MG tablet Commonly known as: PLETAL TAKE 1 TABLET TWICE DAILY   enoxaparin 30 MG/0.3ML injection Commonly known as: LOVENOX Inject 0.3 mLs (30 mg total) into the skin daily.   feeding supplement Liqd Take 237 mLs by mouth 3 (three) times daily between meals for 7 days.   levothyroxine 50 MCG tablet Commonly known as: SYNTHROID Take 50 mcg by mouth daily.   melatonin 3 MG Tabs tablet Take 1 tablet (3 mg total) by mouth at bedtime.   metoprolol succinate 25 MG 24 hr tablet Commonly known as: TOPROL-XL TAKE 1 AND 1/2 TABLETS (37.5 MG TOTAL) DAILY. TAKE WITH OR IMMEDIATELY FOLLOWING A MEAL. DOSE CHANGE What changed: See the new instructions.   oxyCODONE 5 MG immediate release tablet Commonly known as: Oxy IR/ROXICODONE Take 1 tablet (5 mg total) by mouth every 6 (six) hours as needed for up to 3 days for severe pain.   polyethylene glycol 17 g packet Commonly known as: MiraLax Take 17 g by mouth daily.   QUEtiapine 25 MG tablet Commonly known as: SEROQUEL Take 0.5 tablets (12.5 mg total) by mouth 2 (two) times daily for 14 days.   Vitamin D 50 MCG (2000 UT) Caps Take 1 capsule by mouth daily.       Allergies  Allergen Reactions   Trazodone Hcl     Other reaction(s): more restless,  drawing in back of neck   Sulfa Antibiotics Swelling   Sulfonamide Derivatives Swelling     Contact information for follow-up providers     Vanetta Mulders, MD Follow up.   Specialty: Orthopedic Surgery Contact information: [address] [place] [address] [phone]         Lawerance Cruel, MD. Call today.   Specialty: Family Medicine Why: Please call for a posthospital follow appointment. Contact information: [place] [address] [phone]         Sanda Klein, MD .   Specialty: Cardiology Contact information: [address] [place] [address] [phone]              Contact information for after-discharge care     [facility] .   Service: Skilled Chiropodist information: [place]  [address] [phone]                      The results of significant diagnostics from this hospitalization (including imaging, microbiology, ancillary and laboratory) are listed below for reference.    Significant Diagnostic Studies: DG Chest 1 View  Result Date: 08/26/2021 CLINICAL DATA:  Fall, pain EXAM: CHEST  1 VIEW COMPARISON:  Radiograph 08/18/2015 FINDINGS: Cardiomediastinal silhouette is within normal limits. Unchanged dual chamber pacemaker leads. No focal airspace consolidation. No pleural effusion. No pneumothorax. Bilateral shoulder degenerative changes. No acute osseous abnormality. Thoracic spondylosis. IMPRESSION: No evidence of acute cardiopulmonary disease. Electronically Signed   By: Maurine Simmering M.D.   On: 08/26/2021 16:29   CT Head Wo Contrast  Result Date: 08/26/2021 CLINICAL DATA:  A 88 year old female presents for evaluation of pain following fall. EXAM: CT HEAD WITHOUT CONTRAST CT CERVICAL SPINE WITHOUT CONTRAST TECHNIQUE: Multidetector CT imaging of the head and cervical spine was performed following the standard protocol without intravenous contrast. Multiplanar CT image reconstructions  of the cervical spine were also generated. RADIATION DOSE REDUCTION: This exam was performed according to the departmental dose-optimization program which includes automated exposure control, adjustment of the mA and/or kV according to patient size and/or use of iterative reconstruction technique. COMPARISON:  None FINDINGS: CT HEAD FINDINGS Brain: No evidence of acute infarction, hemorrhage, hydrocephalus, extra-axial collection or mass lesion/mass effect. Signs of generalized cerebral atrophy and chronic microvascular ischemic change. Vascular: No hyperdense vessel or unexpected calcification. Skull: Normal. Negative for fracture or focal lesion. Sinuses/Orbits: Visualized paranasal sinuses and orbits without acute finding Other: None. CT CERVICAL SPINE FINDINGS Alignment: Accentuated cervical lordosis. Extensive spinal degenerative changes. Head turned to the RIGHT. No frank listhesis. Degenerative changes include disc space narrowing and facet arthropathy. Skull base and vertebrae: A subtle calcification along the inferior margin of the lateral mass anteriorly of C1. Unfortunately there is streak artifact that passes through this area as well which limits assessment. There is no associated prevertebral soft tissue swelling in the area. No primary bone lesion or focal pathologic process. Loss of height at T1 is age indeterminate with mild loss of height of the T1 superior endplate approximately 95% loss of height at this level. No associated prevertebral soft tissue swelling. Soft tissues and spinal canal: No prevertebral fluid or swelling. No visible canal hematoma. Disc levels: Signs of multilevel degenerative change of the cervical spine with facet arthropathy and disc space narrowing with uncovertebral spurring greatest at C5-6 and C6-7. Upper chest: Biapical pleural and parenchymal scarring. Other: None IMPRESSION: 1. No acute intracranial abnormality. 2. Signs of generalized cerebral atrophy and chronic  microvascular ischemic change. 3. A subtle calcification along the anterior inferior aspect of the lateral mass of C1 (image 23/7) without associated prevertebral soft tissue swelling in the area. Difficult to exclude a subtle fracture in this location degenerative changes possible and unfortunately there is streak artifact in the area from dental hardware. 4. T1 superior endplate the fraction approximately 10% loss of height favors chronic injury in the absence of prevertebral soft tissue swelling. Correlate with any pain in this area. 5. Extensive spinal degenerative changes of the cervical spine with facet arthropathy and disc space narrowing greatest at C5-6 and C6-7. Electronically Signed   By: Zetta Bills M.D.   On: 08/26/2021 18:02   CT Cervical Spine Wo Contrast  Result Date: 08/26/2021 CLINICAL DATA:  A 88 year old female presents for evaluation of pain following fall. EXAM: CT HEAD WITHOUT CONTRAST  CT CERVICAL SPINE WITHOUT CONTRAST TECHNIQUE: Multidetector CT imaging of the head and cervical spine was performed following the standard protocol without intravenous contrast. Multiplanar CT image reconstructions of the cervical spine were also generated. RADIATION DOSE REDUCTION: This exam was performed according to the departmental dose-optimization program which includes automated exposure control, adjustment of the mA and/or kV according to patient size and/or use of iterative reconstruction technique. COMPARISON:  None FINDINGS: CT HEAD FINDINGS Brain: No evidence of acute infarction, hemorrhage, hydrocephalus, extra-axial collection or mass lesion/mass effect. Signs of generalized cerebral atrophy and chronic microvascular ischemic change. Vascular: No hyperdense vessel or unexpected calcification. Skull: Normal. Negative for fracture or focal lesion. Sinuses/Orbits: Visualized paranasal sinuses and orbits without acute finding Other: None. CT CERVICAL SPINE FINDINGS Alignment: Accentuated cervical  lordosis. Extensive spinal degenerative changes. Head turned to the RIGHT. No frank listhesis. Degenerative changes include disc space narrowing and facet arthropathy. Skull base and vertebrae: A subtle calcification along the inferior margin of the lateral mass anteriorly of C1. Unfortunately there is streak artifact that passes through this area as well which limits assessment. There is no associated prevertebral soft tissue swelling in the area. No primary bone lesion or focal pathologic process. Loss of height at T1 is age indeterminate with mild loss of height of the T1 superior endplate approximately 17% loss of height at this level. No associated prevertebral soft tissue swelling. Soft tissues and spinal canal: No prevertebral fluid or swelling. No visible canal hematoma. Disc levels: Signs of multilevel degenerative change of the cervical spine with facet arthropathy and disc space narrowing with uncovertebral spurring greatest at C5-6 and C6-7. Upper chest: Biapical pleural and parenchymal scarring. Other: None IMPRESSION: 1. No acute intracranial abnormality. 2. Signs of generalized cerebral atrophy and chronic microvascular ischemic change. 3. A subtle calcification along the anterior inferior aspect of the lateral mass of C1 (image 23/7) without associated prevertebral soft tissue swelling in the area. Difficult to exclude a subtle fracture in this location degenerative changes possible and unfortunately there is streak artifact in the area from dental hardware. 4. T1 superior endplate the fraction approximately 10% loss of height favors chronic injury in the absence of prevertebral soft tissue swelling. Correlate with any pain in this area. 5. Extensive spinal degenerative changes of the cervical spine with facet arthropathy and disc space narrowing greatest at C5-6 and C6-7. Electronically Signed   By: Zetta Bills M.D.   On: 08/26/2021 18:02   DG C-Arm 1-60 Min-No Report  Result Date:  08/27/2021 Fluoroscopy was utilized by the requesting physician.  No radiographic interpretation.   CUP PACEART REMOTE DEVICE CHECK  Result Date: 08/17/2021 Scheduled remote reviewed. Normal device function.  Last remote 7/26 9 NSVT, 28 fast AV, longest episode 55min, rates 150's-160's EGM's show 1:1 Next remote 91 days. LA  DG HIP UNILAT WITH PELVIS 2-3 VIEWS LEFT  Result Date: 08/27/2021 CLINICAL DATA:  Fracture left hip EXAM: DG HIP (WITH OR WITHOUT PELVIS) 2-3V LEFT COMPARISON:  08/26/2021 FINDINGS: There is interval left hip arthroplasty. No fracture is seen. Fluoroscopic time was 24 seconds. Radiation dose is 1.4 mGy. IMPRESSION: Fluoroscopic assistance was provided for left hip arthroplasty. Electronically Signed   By: Elmer Picker M.D.   On: 08/27/2021 18:51   DG Hip Unilat W or Wo Pelvis 2-3 Views Left  Result Date: 08/26/2021 CLINICAL DATA:  Concern for fracture EXAM: DG HIP (WITH OR WITHOUT PELVIS) 2-3V LEFT COMPARISON:  None. FINDINGS: There is a displaced left femoral neck fracture  with varus angulation. Mild bilateral hip osteoarthritis. IMPRESSION: Displaced left femoral neck fracture with varus angulation. Electronically Signed   By: Maurine Simmering M.D.   On: 08/26/2021 16:27    Microbiology: Recent Results (from the past 240 hour(s))  Resp Panel by RT-PCR (Flu A&B, Covid) Nasopharyngeal Swab     Status: None   Collection Time: 08/26/21  3:02 AM   Specimen: Nasopharyngeal Swab; Nasopharyngeal(NP) swabs in vial transport medium  Result Value Ref Range Status   SARS Coronavirus 2 by RT PCR NEGATIVE NEGATIVE Final    Comment: (NOTE) SARS-CoV-2 target nucleic acids are NOT DETECTED.  The SARS-CoV-2 RNA is generally detectable in upper respiratory specimens during the acute phase of infection. The lowest concentration of SARS-CoV-2 viral copies this assay can detect is 138 copies/mL. A negative result does not preclude SARS-Cov-2 infection and should not be used as the sole  basis for treatment or other patient management decisions. A negative result may occur with  improper specimen collection/handling, submission of specimen other than nasopharyngeal swab, presence of viral mutation(s) within the areas targeted by this assay, and inadequate number of viral copies(<138 copies/mL). A negative result must be combined with clinical observations, patient history, and epidemiological information. The expected result is Negative.  Fact Sheet for Patients:  EntrepreneurPulse.com.au  Fact Sheet for Healthcare Providers:  IncredibleEmployment.be  This test is no t yet approved or cleared by the [place] FDA and  has been authorized for detection and/or diagnosis of SARS-CoV-2 by FDA under an Emergency Use Authorization (EUA). This EUA will remain  in effect (meaning this test can be used) for the duration of the COVID-19 declaration under Section 564(b)(1) of the Act, 21 U.S.C.section 360bbb-3(b)(1), unless the authorization is terminated  or revoked sooner.       Influenza A by PCR NEGATIVE NEGATIVE Final   Influenza B by PCR NEGATIVE NEGATIVE Final    Comment: (NOTE) The Xpert Xpress SARS-CoV-2/FLU/RSV plus assay is intended as an aid in the diagnosis of influenza from Nasopharyngeal swab specimens and should not be used as a sole basis for treatment. Nasal washings and aspirates are unacceptable for Xpert Xpress SARS-CoV-2/FLU/RSV testing.  Fact Sheet for Patients: EntrepreneurPulse.com.au  Fact Sheet for Healthcare Providers: IncredibleEmployment.be  This test is not yet approved or cleared by the [place] FDA and has been authorized for detection and/or diagnosis of SARS-CoV-2 by FDA under an Emergency Use Authorization (EUA). This EUA will remain in effect (meaning this test can be used) for the duration of the COVID-19 declaration under Section 564(b)(1) of the Act,  21 U.S.C. section 360bbb-3(b)(1), unless the authorization is terminated or revoked.  Performed at [facility], [place] [address], [place], [place] [address]   Urine Culture     Status: Abnormal   Collection Time: 08/27/21  9:05 AM   Specimen: Urine, Clean Catch  Result Value Ref Range Status   Specimen Description URINE, CLEAN CATCH  Final   Special Requests   Final    NONE Performed at [facility], [place] [address] [place], [place] [address]    Culture >=100,000 COLONIES/mL ESCHERICHIA COLI (A)  Final   Report Status 08/29/2021 FINAL  Final   Organism ID, Bacteria ESCHERICHIA COLI (A)  Final      Susceptibility   Escherichia coli - MIC*    AMPICILLIN <=2 SENSITIVE Sensitive     CEFAZOLIN <=4 SENSITIVE Sensitive     CEFEPIME <=0.12 SENSITIVE Sensitive     CEFTRIAXONE <=0.25  SENSITIVE Sensitive     CIPROFLOXACIN <=0.25 SENSITIVE Sensitive     GENTAMICIN <=1 SENSITIVE Sensitive     IMIPENEM <=0.25 SENSITIVE Sensitive     NITROFURANTOIN <=16 SENSITIVE Sensitive     TRIMETH/SULFA <=20 SENSITIVE Sensitive     AMPICILLIN/SULBACTAM <=2 SENSITIVE Sensitive     PIP/TAZO <=4 SENSITIVE Sensitive     * >=100,000 COLONIES/mL ESCHERICHIA COLI  Surgical PCR Screen     Status: None   Collection Time: 08/27/21  9:12 AM   Specimen: Urine, Catheterized; Nasal Swab  Result Value Ref Range Status   MRSA, PCR NEGATIVE NEGATIVE Final   Staphylococcus aureus NEGATIVE NEGATIVE Final    Comment: (NOTE) The Xpert SA Assay (FDA approved for NASAL specimens in patients 50 years of age and older), is one component of a comprehensive surveillance program. It is not intended to diagnose infection nor to guide or monitor treatment. Performed at [facility], [place] [address], [place], [place] [address]   SARS CORONAVIRUS 2 (TAT 6-24 HRS) Nasopharyngeal Nasopharyngeal Swab     Status: None   Collection Time: 09/01/21 10:51 PM   Specimen: Nasopharyngeal Swab  Result Value Ref Range  Status   SARS Coronavirus 2 NEGATIVE NEGATIVE Final    Comment: (NOTE) SARS-CoV-2 target nucleic acids are NOT DETECTED.  The SARS-CoV-2 RNA is generally detectable in upper and lower respiratory specimens during the acute phase of infection. Negative results do not preclude SARS-CoV-2 infection, do not rule out co-infections with other pathogens, and should not be used as the sole basis for treatment or other patient management decisions. Negative results must be combined with clinical observations, patient history, and epidemiological information. The expected result is Negative.  Fact Sheet for Patients: SugarRoll.be  Fact Sheet for Healthcare Providers: https://www.woods-mathews.com/  This test is not yet approved or cleared by the [place] FDA and  has been authorized for detection and/or diagnosis of SARS-CoV-2 by FDA under an Emergency Use Authorization (EUA). This EUA will remain  in effect (meaning this test can be used) for the duration of the COVID-19 declaration under Se ction 564(b)(1) of the Act, 21 U.S.C. section 360bbb-3(b)(1), unless the authorization is terminated or revoked sooner.  Performed at [facility], [place] [address], [place], [place] [address]      Labs: Basic Metabolic Panel: Recent Labs  Lab 08/30/21 0848 08/31/21 0301  NA 136 141  K 3.1* 4.6  CL 99 107  CO2 28 27  GLUCOSE 142* 122*  BUN 20 20  CREATININE 0.84 0.79  CALCIUM 8.2* 8.5*   Liver Function Tests: No results for input(s): AST, ALT, ALKPHOS, BILITOT, PROT, ALBUMIN in the last 168 hours. No results for input(s): LIPASE, AMYLASE in the last 168 hours. No results for input(s): AMMONIA in the last 168 hours. CBC: Recent Labs  Lab 08/28/21 0253 08/30/21 0848  WBC 9.6 8.6  NEUTROABS 7.7  --   HGB 12.2 10.9*  HCT 36.4 32.0*  MCV 89.7 89.4  PLT 174 160   Cardiac Enzymes: Recent Labs  Lab 08/31/21 0301  CKTOTAL 268*    BNP: BNP (last 3 results) No results for input(s): BNP in the last 8760 hours.  ProBNP (last 3 results) No results for input(s): PROBNP in the last 8760 hours.  CBG: No results for input(s): GLUCAP in the last 168 hours.     Signed:  Kayleen Memos, MD [facility] 09/03/2021, 11:11 AM

## 2021-09-03 NOTE — Progress Notes (Signed)
Called report to [facility] spoke to nurse [facility] who will be taking care of patient when she arrives to their facility. Nurse verbalized understanding of report and had no further questions.

## 2021-09-03 NOTE — Progress Notes (Signed)
Attempted to call report to [facility] 2x [phone]) no answer.

## 2021-09-03 NOTE — Progress Notes (Signed)
Attempted to call report to [facility] [phone]) 4x no answer.

## 2021-09-03 NOTE — Progress Notes (Signed)
Patient discharged to [facility] via [facility] with all belonigngs. Paper prescriptions (Lovenox, miralax, xanax, melatonin, oxycodone) given to patient with discharge paperwork and taken at discharge with belongings.

## 2021-09-03 NOTE — Care Management Important Message (Signed)
Important Message  Patient Details  Name: Katelyn Morris MRN: [id number] Date of Birth: 1923-01-31   Medicare Important Message Given:  Yes     Hannah Beat 09/03/2021, 11:38 AM

## 2021-09-03 NOTE — TOC Transition Note (Signed)
Transition of Care [facility]) - CM/SW Discharge Note   Patient Details  Name: Katelyn Morris MRN: [id number] Date of Birth: February 19, 1923  [facility] CM/SW Contact:  Coralee Pesa, [place] Phone Number: 09/03/2021, 11:19 AM   Clinical Narrative:    Pt to be transported to [facility] via [facility]. Nurse to call report to [phone]. Rm# [phone]   Final next level of care: Skilled Nursing Facility Barriers to Discharge: Barriers Resolved   Patient Goals and CMS Choice        Discharge Placement              Patient chooses bed at:  [facility]) Patient to be transferred to facility by: [place] Name of family member notified: [name] Patient and family notified of of transfer: 09/03/21  Discharge Plan and Services In-house Referral: Clinical Social Work                                   Social Determinants of Health ([place]) Interventions     Readmission Risk Interventions No flowsheet data found.

## 2021-09-06 DIAGNOSIS — F419 Anxiety disorder, unspecified: Secondary | ICD-10-CM | POA: Diagnosis not present

## 2021-09-06 DIAGNOSIS — I495 Sick sinus syndrome: Secondary | ICD-10-CM | POA: Diagnosis not present

## 2021-09-06 DIAGNOSIS — S72002D Fracture of unspecified part of neck of left femur, subsequent encounter for closed fracture with routine healing: Secondary | ICD-10-CM | POA: Diagnosis not present

## 2021-09-06 DIAGNOSIS — I739 Peripheral vascular disease, unspecified: Secondary | ICD-10-CM | POA: Diagnosis not present

## 2021-09-06 DIAGNOSIS — Z950 Presence of cardiac pacemaker: Secondary | ICD-10-CM | POA: Diagnosis not present

## 2021-09-06 DIAGNOSIS — N183 Chronic kidney disease, stage 3 unspecified: Secondary | ICD-10-CM | POA: Diagnosis not present

## 2021-09-06 DIAGNOSIS — Z96642 Presence of left artificial hip joint: Secondary | ICD-10-CM | POA: Diagnosis not present

## 2021-09-06 DIAGNOSIS — E039 Hypothyroidism, unspecified: Secondary | ICD-10-CM | POA: Diagnosis not present

## 2021-09-07 DIAGNOSIS — E039 Hypothyroidism, unspecified: Secondary | ICD-10-CM | POA: Diagnosis not present

## 2021-09-07 DIAGNOSIS — R410 Disorientation, unspecified: Secondary | ICD-10-CM | POA: Diagnosis not present

## 2021-09-09 DIAGNOSIS — N183 Chronic kidney disease, stage 3 unspecified: Secondary | ICD-10-CM | POA: Diagnosis not present

## 2021-09-09 DIAGNOSIS — R410 Disorientation, unspecified: Secondary | ICD-10-CM | POA: Diagnosis not present

## 2021-09-09 DIAGNOSIS — F419 Anxiety disorder, unspecified: Secondary | ICD-10-CM | POA: Diagnosis not present

## 2021-09-10 DIAGNOSIS — F419 Anxiety disorder, unspecified: Secondary | ICD-10-CM | POA: Diagnosis not present

## 2021-09-10 DIAGNOSIS — R410 Disorientation, unspecified: Secondary | ICD-10-CM | POA: Diagnosis not present

## 2021-09-15 ENCOUNTER — Ambulatory Visit (INDEPENDENT_AMBULATORY_CARE_PROVIDER_SITE_OTHER): Payer: Medicare HMO | Admitting: Orthopaedic Surgery

## 2021-09-15 ENCOUNTER — Other Ambulatory Visit: Payer: Self-pay

## 2021-09-15 ENCOUNTER — Ambulatory Visit (HOSPITAL_BASED_OUTPATIENT_CLINIC_OR_DEPARTMENT_OTHER)
Admission: RE | Admit: 2021-09-15 | Discharge: 2021-09-15 | Disposition: A | Discharge status: Home / Self Care | Payer: Medicare HMO | Source: Ambulatory Visit | Attending: Orthopaedic Surgery | Admitting: Orthopaedic Surgery

## 2021-09-15 DIAGNOSIS — Z96642 Presence of left artificial hip joint: Secondary | ICD-10-CM | POA: Diagnosis not present

## 2021-09-15 DIAGNOSIS — Z471 Aftercare following joint replacement surgery: Secondary | ICD-10-CM | POA: Diagnosis not present

## 2021-09-15 DIAGNOSIS — S72002A Fracture of unspecified part of neck of left femur, initial encounter for closed fracture: Secondary | ICD-10-CM | POA: Insufficient documentation

## 2021-09-15 DIAGNOSIS — M1611 Unilateral primary osteoarthritis, right hip: Secondary | ICD-10-CM | POA: Diagnosis not present

## 2021-09-15 NOTE — Progress Notes (Signed)
Post Operative Evaluation    Procedure/Date of Surgery: Status post left hip hemiarthroplasty August 27, 2021  Interval History:   2 status post left hip hemiarthroplasty.  She is still having some confusion which is beyond her baseline.  She is at [facility].  She has been up and about with physical therapy.  She has been able to walk down the hallway with a walker.  She is here today again with her son and daughter-in-law.     PMH/PSH/Family History/Social History/Meds/Allergies:    Past Medical History:  Diagnosis Date   Arthritis    in knees per patient   Dysrhythmia    Bradycardia   Heart murmur    Hypertension    Hypothyroidism    Pacemaker 02/20/2012   dual chamber   SSS (sick sinus syndrome) ([place]) 08/06/2014   Past Surgical History:  Procedure Laterality Date   ABDOMINAL HYSTERECTOMY     bladder tack     INSERT / REPLACE / REMOVE PACEMAKER     PERMANENT PACEMAKER INSERTION N/A 02/20/2012   Procedure: PERMANENT PACEMAKER INSERTION;  Surgeon: Sanda Klein, MD;  Location: [place] CATH LAB;  Service: Cardiovascular;  Laterality: N/A;   ROTATOR CUFF REPAIR     bilateral   TOTAL HIP ARTHROPLASTY Left 08/27/2021   Procedure: HEMI HIP ARTHROPLASTY ANTERIOR APPROACH;  Surgeon: Vanetta Mulders, MD;  Location: [place];  Service: Orthopedics;  Laterality: Left;   Social History   Socioeconomic History   Marital status: Widowed    Spouse name: Not on file   Number of children: Not on file   Years of education: Not on file   Highest education level: Not on file  Occupational History   Not on file  Tobacco Use   Smoking status: Never   Smokeless tobacco: Never  Substance and Sexual Activity   Alcohol use: No   Drug use: No   Sexual activity: Never    Birth control/protection: Post-menopausal  Other Topics Concern   Not on file  Social History Narrative   Not on file   Social Determinants of Health   Financial Resource Strain: Not  on file  Food Insecurity: Not on file  Transportation Needs: Not on file  Physical Activity: Not on file  Stress: Not on file  Social Connections: Not on file   No family history on file. Allergies  Allergen Reactions   Trazodone Hcl     Other reaction(s): more restless,  drawing in back of neck   Sulfa Antibiotics Swelling   Sulfonamide Derivatives Swelling   Current Outpatient Medications  Medication Sig Dispense Refill   aspirin 81 MG tablet Take 81 mg by mouth daily.     atorvastatin (LIPITOR) 10 MG tablet TAKE 1 TABLET EVERY DAY (Patient taking differently: Take 10 mg by mouth every evening.) 90 tablet 3   Cholecalciferol (VITAMIN D) 50 MCG (2000 UT) CAPS Take 1 capsule by mouth daily.     cilostazol (PLETAL) 50 MG tablet TAKE 1 TABLET TWICE DAILY (Patient taking differently: Take 50 mg by mouth 2 (two) times daily.) 180 tablet 3   enoxaparin (LOVENOX) 30 MG/0.3ML injection Inject 0.3 mLs (30 mg total) into the skin daily. 9 mL 0   levothyroxine (SYNTHROID, LEVOTHROID) 50 MCG tablet Take 50 mcg by mouth daily.     melatonin 3  MG TABS tablet Take 1 tablet (3 mg total) by mouth at bedtime. 30 tablet 0   metoprolol succinate (TOPROL-XL) 25 MG 24 hr tablet TAKE 1 AND 1/2 TABLETS (37.5 MG TOTAL) DAILY. TAKE WITH OR IMMEDIATELY FOLLOWING A MEAL. DOSE CHANGE (Patient taking differently: Take 37.5 mg by mouth daily.) 135 tablet 1   polyethylene glycol (MIRALAX) 17 g packet Take 17 g by mouth daily. 14 each 0   QUEtiapine (SEROQUEL) 25 MG tablet Take 0.5 tablets (12.5 mg total) by mouth 2 (two) times daily for 14 days. 14 tablet 0   No current facility-administered medications for this visit.   DG Hip Unilat W OR W/O Pelvis Min 4 Views Left  Result Date: 09/15/2021 CLINICAL DATA:  Status post left hip arthroplasty. EXAM: DG HIP (WITH OR WITHOUT PELVIS) 4+V LEFT COMPARISON:  Hip radiographs dated 08/27/2020 and 08/26/2020. FINDINGS: The patient is status post a total left hip arthroplasty.  The hardware appears intact and well aligned. No periprosthetic fracture is identified. There is mild right hip osteoarthritis. IMPRESSION: Postoperative appearance of total left hip arthroplasty. Electronically Signed   By: Zerita Boers M.D.   On: 09/15/2021 14:09    Review of Systems:   A ROS was performed including pertinent positives and negatives as documented in the HPI.   Musculoskeletal Exam:    There were no vitals taken for this visit.  Incision is clean dry and intact.  Staples removed today.  Able to extend and flex of the left knee.  Sensation intact throughout.  2+ dorsalis pedis pulse  Imaging:    X-rays AP pelvis 2 views left hip: Status post left hip hemiarthroplasty without evidence of hardware failure  I personally reviewed and interpreted the radiographs.   Assessment:   88 year old female 2 weeks status post left hip hemiarthroplasty.  I will continue to like her to be active as tolerated.  We will plan on making a referral to her home health agency so that we can assist in having home therapy as well as nursing.  Plan :    -Return to clinic in 4 weeks     I personally saw and evaluated the patient, and participated in the management and treatment plan.  Vanetta Mulders, MD Attending Physician, Orthopedic Surgery  This document was dictated using Dragon voice recognition software. A reasonable attempt at proof reading has been made to minimize errors.

## 2021-09-16 ENCOUNTER — Telehealth: Payer: Self-pay | Admitting: Orthopaedic Surgery

## 2021-09-16 NOTE — Telephone Encounter (Signed)
Informed Katelyn Morris, [facility] often partners with Katelyn Morris since they cover a bigger radius, and Katelyn Morris with [facility] informed me she was sending the referral to [facility] yesterday. The care is similar with [facility] and [facility]. Katelyn Morris understood and no further questions were asked, she just wanted to confirm.

## 2021-09-16 NOTE — Telephone Encounter (Signed)
Patient's daughter in [place] called stating she received a call from [facility] which stated they received a referral for Home Health and nursing for the patient. [name] said Dr. Sammuel Hines said patient would be referred to [facility]. [name] asked why wasn't her mother in law referred to [facility] in instead of [place]?  [name] said she wanted to know what's the difference in the two facilities?  The number to contact [name] is [phone]

## 2021-09-20 DIAGNOSIS — Z96642 Presence of left artificial hip joint: Secondary | ICD-10-CM | POA: Diagnosis not present

## 2021-09-20 DIAGNOSIS — N183 Chronic kidney disease, stage 3 unspecified: Secondary | ICD-10-CM | POA: Diagnosis not present

## 2021-09-20 DIAGNOSIS — F419 Anxiety disorder, unspecified: Secondary | ICD-10-CM | POA: Diagnosis not present

## 2021-09-20 DIAGNOSIS — I739 Peripheral vascular disease, unspecified: Secondary | ICD-10-CM | POA: Diagnosis not present

## 2021-09-20 DIAGNOSIS — E039 Hypothyroidism, unspecified: Secondary | ICD-10-CM | POA: Diagnosis not present

## 2021-09-20 DIAGNOSIS — I495 Sick sinus syndrome: Secondary | ICD-10-CM | POA: Diagnosis not present

## 2021-09-20 DIAGNOSIS — Z950 Presence of cardiac pacemaker: Secondary | ICD-10-CM | POA: Diagnosis not present

## 2021-09-20 DIAGNOSIS — S72002D Fracture of unspecified part of neck of left femur, subsequent encounter for closed fracture with routine healing: Secondary | ICD-10-CM | POA: Diagnosis not present

## 2021-09-22 ENCOUNTER — Telehealth: Payer: Self-pay | Admitting: Cardiovascular Disease

## 2021-09-22 NOTE — Telephone Encounter (Signed)
-  Pt's daughter in law called stating pt has an appointment scheduled for 4/10 but she will be out of town from 4/1-5/15.  ?-Daughter in law state she would prefer to be present and questioning if Dr. Loletha Grayer is ok with seeing pt sooner or if pt can wait until the end of May.  ?-Will forward to MD  ?

## 2021-09-22 NOTE — Telephone Encounter (Signed)
Katelyn Morris, daughter-in-law of the patient called. She wanted to know if there was any way that Dr. Loletha Grayer could be flexible with her upcoming appointment.  Katelyn Morris, the DIL that normally brings her to her appointments will be out of town during the currently scheduled appt. She wanted to know if DR. C would be able to see her before they leave or if it would be OK to wait until May when the DIL comes home. ?The family that will be with her does not know her medical history as well and Katelyn Morris would prefer to be the one to bring her  ? ?Please advise  ?

## 2021-09-23 NOTE — Telephone Encounter (Signed)
-  Daughter in law made aware and verbalized understanding ?-Appointment rescheduled for 12/06/21 @ 11:20 am  ?

## 2021-09-23 NOTE — Telephone Encounter (Signed)
OK to wait until May. ?

## 2021-09-24 DIAGNOSIS — J351 Hypertrophy of tonsils: Secondary | ICD-10-CM | POA: Diagnosis not present

## 2021-09-27 ENCOUNTER — Telehealth: Payer: Self-pay | Admitting: Cardiovascular Disease

## 2021-09-27 ENCOUNTER — Telehealth: Payer: Self-pay | Admitting: Orthopaedic Surgery

## 2021-09-27 NOTE — Telephone Encounter (Signed)
Routed to MD and nurse to make aware. ?

## 2021-09-27 NOTE — Telephone Encounter (Signed)
New Message: ? ? ?Patient's Daughter in-law called. She wanted Dr C to know that the patient is now under Hospice Care. ? ? ? ?

## 2021-09-27 NOTE — Telephone Encounter (Signed)
Patient's daughter called. She says mom is in hospice care.  ?

## 2021-09-28 ENCOUNTER — Other Ambulatory Visit: Payer: Self-pay

## 2021-09-28 NOTE — Patient Outreach (Signed)
[facility]) Care Management ? ?09/28/2021 ? ?Janaysia Mcleroy ?1922/12/26 ?[id number] ? ? ?[employer] member assigned for transition of care needs. Request assigned to Jon Billings, RN for follow up. ? ?Ina Homes ?[facility]-Care Management Assistant ?[phone]  ?

## 2021-09-28 NOTE — Patient Outreach (Signed)
[facility]) Care Management ? ?09/28/2021 ? ?Katelyn Morris ?1923-06-30 ?[id number] ? ? ?Referral Date: 09/28/21 ?Referral Source: [employer] Report ?Date of Discharge:09/24/21 ?Facility: [facility] ?Insurance: [employer] ? ?Patient currently on hospice.  ? ? ?Plan: ?RN CM will close case.   ? ? ?Jone Baseman, RN, MSN ?[facility] Care Management ?Care Management Coordinator ?Direct Line [phone] ?Toll Free: [phone]  ?Fax: [phone] ? ?

## 2021-10-13 ENCOUNTER — Encounter (HOSPITAL_BASED_OUTPATIENT_CLINIC_OR_DEPARTMENT_OTHER): Payer: Medicare HMO | Admitting: Orthopaedic Surgery

## 2021-10-23 DEATH — deceased

## 2021-11-01 ENCOUNTER — Encounter: Payer: Medicare HMO | Admitting: Cardiovascular Disease

## 2021-12-06 ENCOUNTER — Encounter: Payer: Medicare HMO | Admitting: Cardiovascular Disease

## 2023-02-18 IMAGING — CT CT HEAD W/O CM
3 of 4 series · 14 of 47 positions shown, 16 images · non-contrast
Comparison: None

CLINICAL DATA: A [AGE] female presents for evaluation of pain
following fall.



[Series 3: head 5.0 h30s · axial · 0.40mm/px · z∈[+54,+184]mm · 8 of 32 slices shown, 10 images]
[im 3/32  brain]
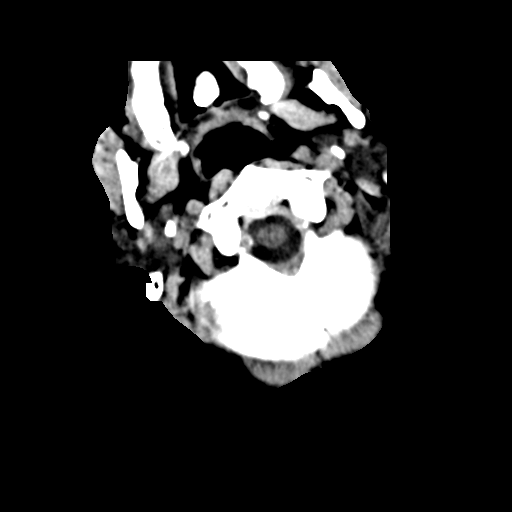
[im 3/32  bone]
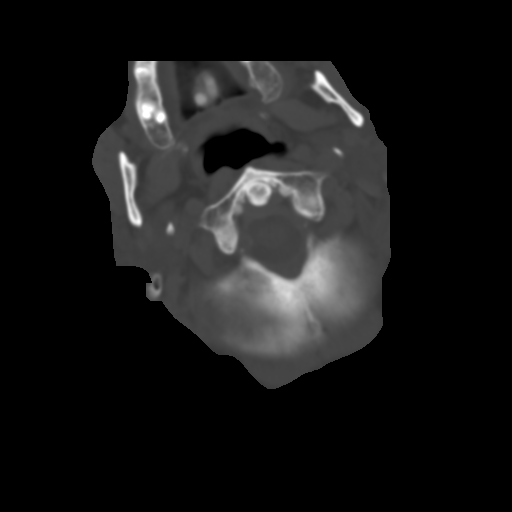
[im 7/32  brain]
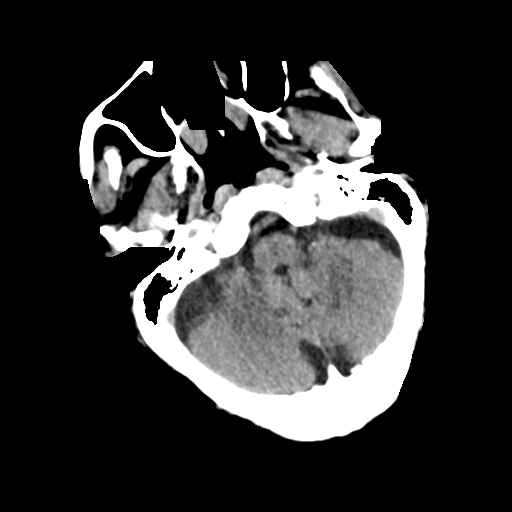
[im 12/32  brain]
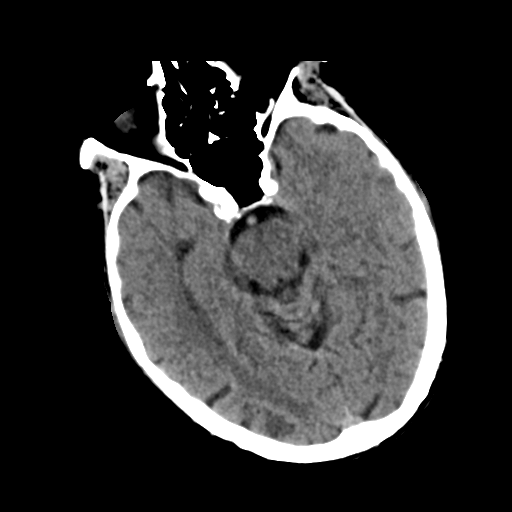
[im 14/32  brain]
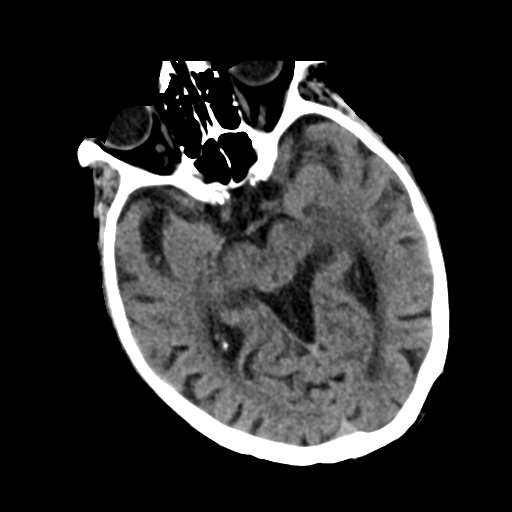
[im 18/32  brain]
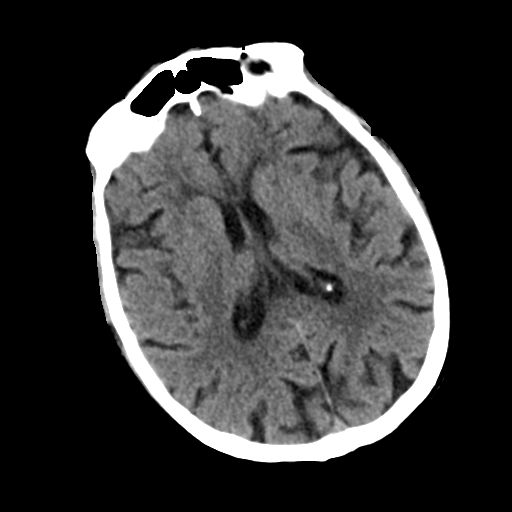
[im 18/32  bone]
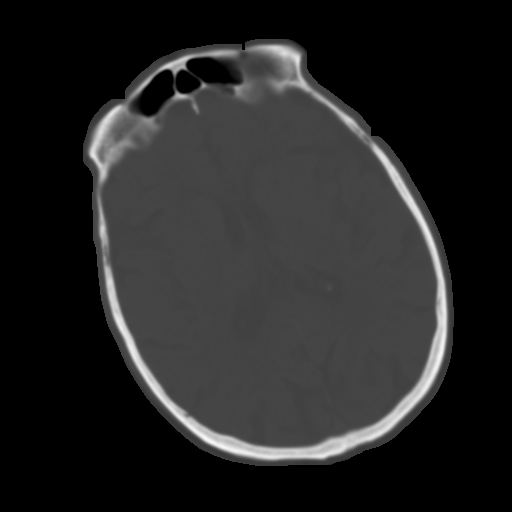
[im 20/32  brain]
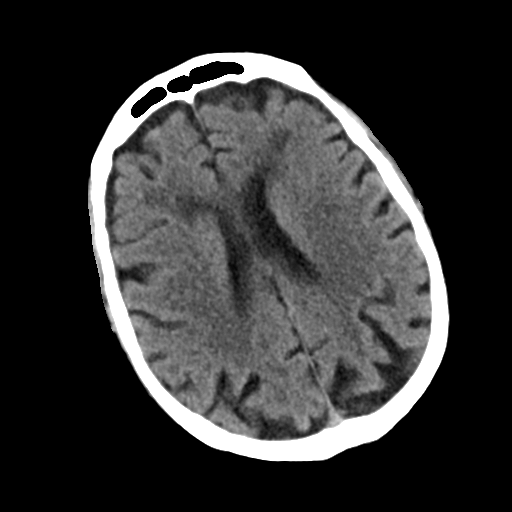
[im 25/32  brain]
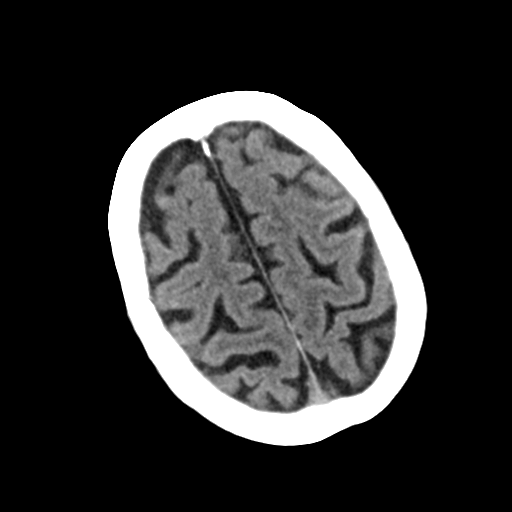
[im 29/32  brain]
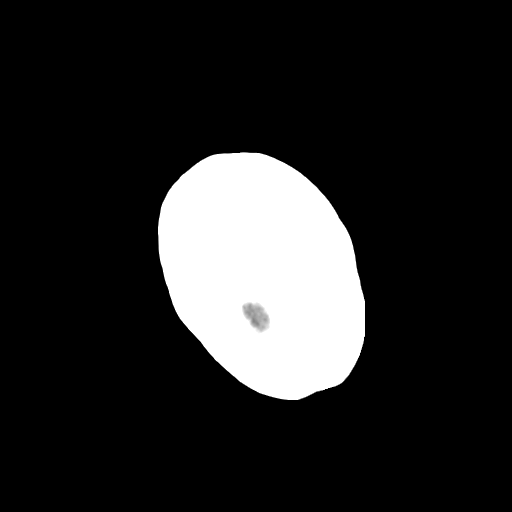

[Series 5: head 3.0 mpr cor · coronal · 0.33mm/px · 3 of 67 slices shown]
[im 23/67  brain]
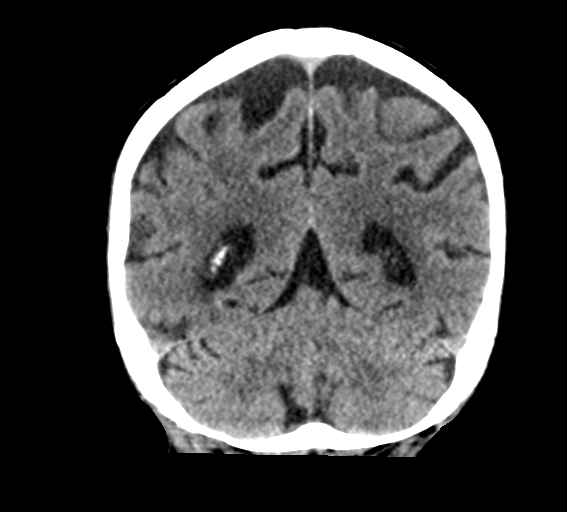
[im 30/67  brain]
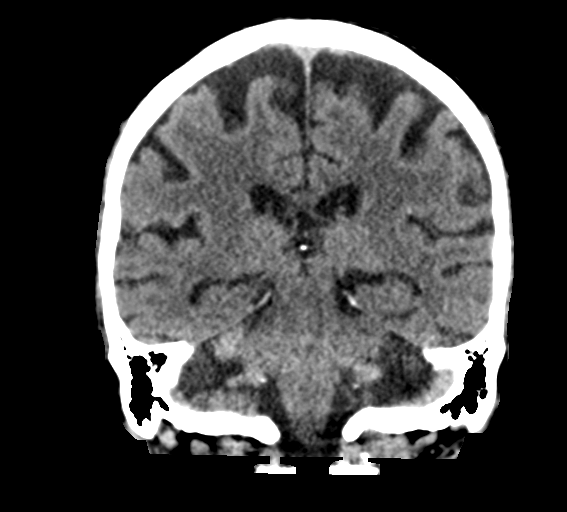
[im 37/67  brain]
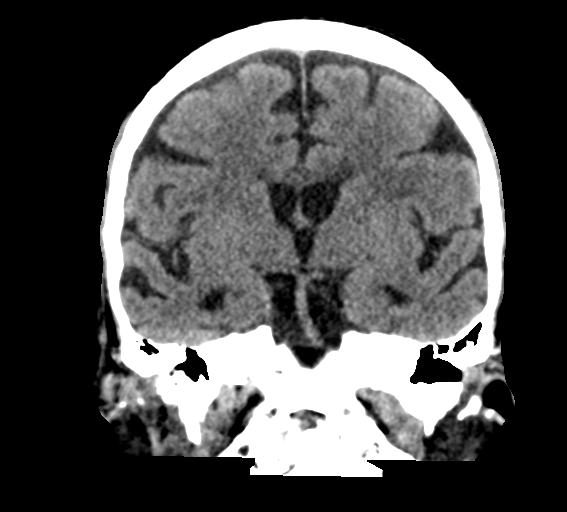

[Series 6: head 3.0 mpr sag · sagittal · 0.34mm/px · 3 of 58 slices shown]
[im 20/58  brain]
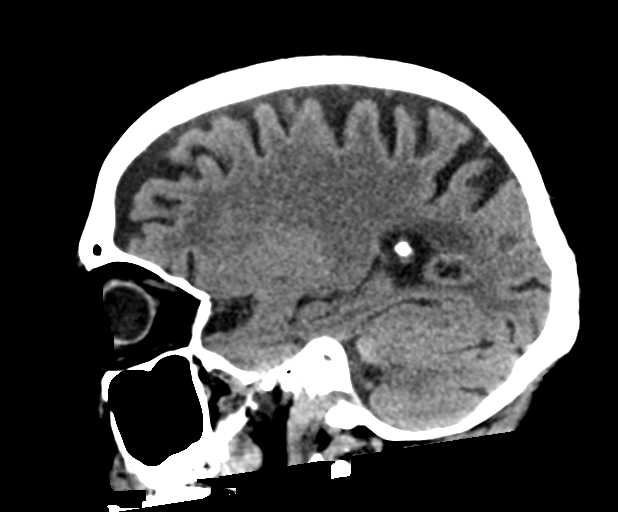
[im 29/58  brain]
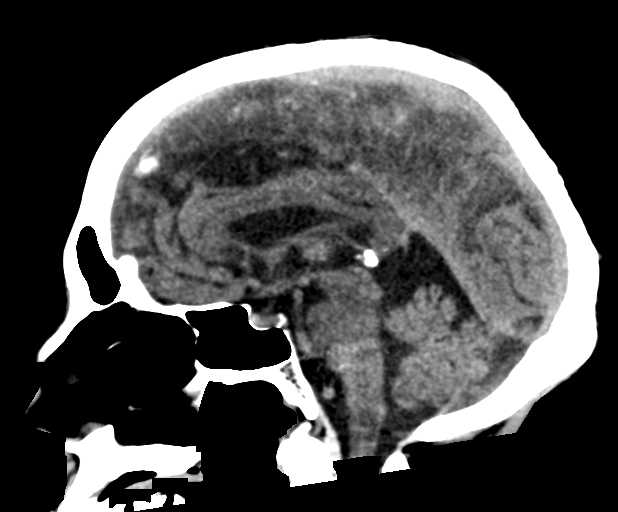
[im 39/58  brain]
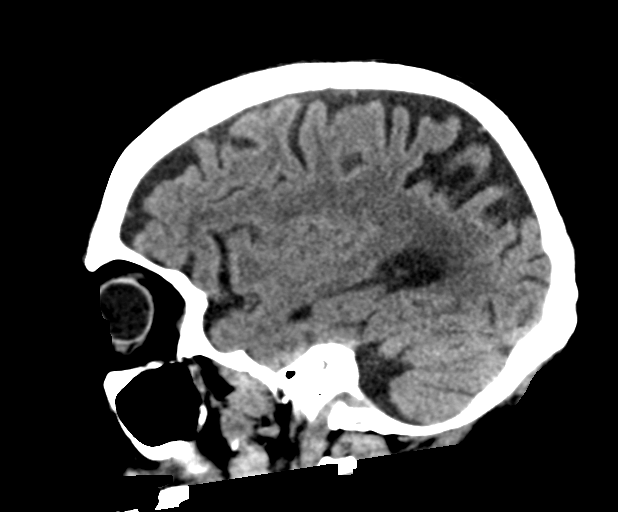

[14 of 47 positions shown; findings below may reference images not displayed]

FINDINGS: CT HEAD FINDINGS

Brain: No evidence of acute infarction, hemorrhage, hydrocephalus,
extra-axial collection or mass lesion/mass effect. Signs of
generalized cerebral atrophy and chronic microvascular ischemic
change.

Vascular: No hyperdense vessel or unexpected calcification.

Skull: Normal. Negative for fracture or focal lesion.

Sinuses/Orbits: Visualized paranasal sinuses and orbits without
acute finding

Other: None.

CT CERVICAL SPINE FINDINGS

Alignment: Accentuated cervical lordosis. Extensive spinal
degenerative changes. Head turned to the RIGHT. No frank listhesis.
Degenerative changes include disc space narrowing and facet
arthropathy.

Skull base and vertebrae: A subtle calcification along the inferior
margin of the lateral mass anteriorly of C1. Unfortunately there is
streak artifact that passes through this area as well which limits
assessment. There is no associated prevertebral soft tissue swelling
in the area. No primary bone lesion or focal pathologic process.
Loss of height at T1 is age indeterminate with mild loss of height
of the T1 superior endplate approximately 10% loss of height at this
level. No associated prevertebral soft tissue swelling.

Soft tissues and spinal canal: No prevertebral fluid or swelling. No
visible canal hematoma.

Disc levels: Signs of multilevel degenerative change of the cervical
spine with facet arthropathy and disc space narrowing with
uncovertebral spurring greatest at C5-6 and C6-7.

Upper chest: Biapical pleural and parenchymal scarring.

Other: None
IMPRESSION: 1. No acute intracranial abnormality.
2. Signs of generalized cerebral atrophy and chronic microvascular
ischemic change.
3. A subtle calcification along the anterior inferior aspect of the
lateral mass of C1 (image [DATE]) without associated prevertebral soft
tissue swelling in the area. Difficult to exclude a subtle fracture
in this location degenerative changes possible and unfortunately
there is streak artifact in the area from dental hardware.
4. T1 superior endplate the fraction approximately 10% loss of
height favors chronic injury in the absence of prevertebral soft
tissue swelling. Correlate with any pain in this area.
5. Extensive spinal degenerative changes of the cervical spine with
facet arthropathy and disc space narrowing greatest at C5-6 and
C6-7.
